# Patient Record
Sex: Male | Born: 1980 | Race: Black or African American | Hispanic: No | Marital: Single | Smoking: Former smoker
Health system: Southern US, Community
[De-identification: ages and names within clinical notes are randomized; demographics above are authoritative.]

## PROBLEM LIST (undated history)

## (undated) DIAGNOSIS — H9319 Tinnitus, unspecified ear: Secondary | ICD-10-CM

## (undated) DIAGNOSIS — E785 Hyperlipidemia, unspecified: Secondary | ICD-10-CM

## (undated) DIAGNOSIS — K700 Alcoholic fatty liver: Secondary | ICD-10-CM

## (undated) DIAGNOSIS — R42 Dizziness and giddiness: Secondary | ICD-10-CM

## (undated) DIAGNOSIS — G43909 Migraine, unspecified, not intractable, without status migrainosus: Secondary | ICD-10-CM

## (undated) DIAGNOSIS — I10 Essential (primary) hypertension: Secondary | ICD-10-CM

## (undated) DIAGNOSIS — K219 Gastro-esophageal reflux disease without esophagitis: Secondary | ICD-10-CM

## (undated) DIAGNOSIS — J342 Deviated nasal septum: Secondary | ICD-10-CM

## (undated) DIAGNOSIS — G709 Myoneural disorder, unspecified: Secondary | ICD-10-CM

## (undated) DIAGNOSIS — J343 Hypertrophy of nasal turbinates: Secondary | ICD-10-CM

## (undated) HISTORY — PX: NO PAST SURGERIES: SHX2092

## (undated) HISTORY — PX: COLONOSCOPY: SHX174

---
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
## 2002-10-14 ENCOUNTER — Emergency Department (HOSPITAL_COMMUNITY): Admission: EM | Admit: 2002-10-14 | Discharge: 2002-10-14 | Payer: Self-pay | Admitting: Emergency Medicine

## 2005-10-01 ENCOUNTER — Emergency Department (HOSPITAL_COMMUNITY): Admission: EM | Admit: 2005-10-01 | Discharge: 2005-10-01 | Payer: Self-pay | Admitting: Emergency Medicine

## 2005-11-16 ENCOUNTER — Emergency Department (HOSPITAL_COMMUNITY): Admission: EM | Admit: 2005-11-16 | Discharge: 2005-11-16 | Payer: Self-pay | Admitting: Emergency Medicine

## 2006-07-30 ENCOUNTER — Emergency Department (HOSPITAL_COMMUNITY): Admission: EM | Admit: 2006-07-30 | Discharge: 2006-07-30 | Payer: Self-pay | Admitting: Emergency Medicine

## 2007-06-26 ENCOUNTER — Emergency Department (HOSPITAL_COMMUNITY): Admission: EM | Admit: 2007-06-26 | Discharge: 2007-06-26 | Payer: Self-pay | Admitting: Emergency Medicine

## 2007-09-10 ENCOUNTER — Emergency Department (HOSPITAL_COMMUNITY): Admission: EM | Admit: 2007-09-10 | Discharge: 2007-09-10 | Payer: Self-pay | Admitting: Emergency Medicine

## 2008-06-06 ENCOUNTER — Emergency Department (HOSPITAL_COMMUNITY): Admission: EM | Admit: 2008-06-06 | Discharge: 2008-06-06 | Payer: Self-pay | Admitting: Emergency Medicine

## 2008-12-10 ENCOUNTER — Emergency Department (HOSPITAL_COMMUNITY): Admission: EM | Admit: 2008-12-10 | Discharge: 2008-12-10 | Payer: Self-pay | Admitting: Emergency Medicine

## 2010-10-28 ENCOUNTER — Emergency Department (HOSPITAL_COMMUNITY)
Admission: EM | Admit: 2010-10-28 | Discharge: 2010-10-29 | Payer: Self-pay | Source: Home / Self Care | Admitting: Emergency Medicine

## 2011-06-05 ENCOUNTER — Emergency Department (HOSPITAL_COMMUNITY)
Admission: EM | Admit: 2011-06-05 | Discharge: 2011-06-05 | Disposition: A | Discharge status: Home / Self Care | Payer: Self-pay | Attending: Emergency Medicine | Admitting: Emergency Medicine

## 2011-06-05 ENCOUNTER — Emergency Department (HOSPITAL_COMMUNITY): Payer: Self-pay

## 2011-06-05 DIAGNOSIS — R079 Chest pain, unspecified: Secondary | ICD-10-CM | POA: Insufficient documentation

## 2011-06-05 DIAGNOSIS — R209 Unspecified disturbances of skin sensation: Secondary | ICD-10-CM | POA: Insufficient documentation

## 2011-06-05 DIAGNOSIS — F17200 Nicotine dependence, unspecified, uncomplicated: Secondary | ICD-10-CM | POA: Insufficient documentation

## 2011-06-05 DIAGNOSIS — R51 Headache: Secondary | ICD-10-CM | POA: Insufficient documentation

## 2011-06-05 LAB — COMPREHENSIVE METABOLIC PANEL
ALT: 56 U/L — ABNORMAL HIGH (ref 0–53)
AST: 35 U/L (ref 0–37)
Albumin: 4.1 g/dL (ref 3.5–5.2)
Alkaline Phosphatase: 90 U/L (ref 39–117)
BUN: 17 mg/dL (ref 6–23)
CO2: 28 mEq/L (ref 19–32)
Calcium: 9.8 mg/dL (ref 8.4–10.5)
Chloride: 103 mEq/L (ref 96–112)
Creatinine, Ser: 0.93 mg/dL (ref 0.50–1.35)
GFR calc Af Amer: >60 mL/min (ref >60)
GFR calc non Af Amer: >60 mL/min (ref >60)
Glucose, Bld: 110 mg/dL — ABNORMAL HIGH (ref 70–99)
Potassium: 4.1 mEq/L (ref 3.5–5.1)
Sodium: 140 mEq/L (ref 135–145)
Total Bilirubin: 0.3 mg/dL (ref 0.3–1.2)
Total Protein: 7.9 g/dL (ref 6.0–8.3)

## 2011-06-05 LAB — TROPONIN I: Troponin I: <0.3 ng/mL (ref <0.30)

## 2013-04-06 ENCOUNTER — Emergency Department (HOSPITAL_COMMUNITY)
Admission: EM | Admit: 2013-04-06 | Discharge: 2013-04-06 | Disposition: A | Discharge status: Home / Self Care | Payer: Self-pay | Source: Home / Self Care

## 2013-04-06 ENCOUNTER — Encounter (HOSPITAL_COMMUNITY): Payer: Self-pay | Admitting: Emergency Medicine

## 2013-04-06 DIAGNOSIS — M545 Low back pain: Secondary | ICD-10-CM

## 2013-04-06 DIAGNOSIS — M7918 Myalgia, other site: Secondary | ICD-10-CM

## 2013-04-06 LAB — POCT URINALYSIS DIP (DEVICE)
Hgb urine dipstick: NEGATIVE
Ketones, ur: NEGATIVE mg/dL
Protein, ur: NEGATIVE mg/dL
Specific Gravity, Urine: 1.02 (ref 1.005–1.030)

## 2013-04-06 MED ORDER — TRAMADOL HCL 50 MG PO TABS: 50.0000 mg | ORAL_TABLET | Freq: Four times a day (QID) | ORAL | Status: DC | PRN

## 2013-04-06 NOTE — ED Provider Notes (Signed)
History     CSN: [id number]  Arrival date & time 04/06/13  1626   None     Chief Complaint  Patient presents with  . 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 He complains of this dull ache in the lower back primarily in the right low back. No history of injury, fall. Worse with prolonged standing and bending frequently.  Patient is a 32 y.o. male presenting with back pain.  Back Pain Location:  Lumbar spine Quality:  Aching Radiates to:  Does not radiate Pain severity:  Moderate Pain is:  Worse during the day Onset quality:  Gradual Duration:  2 weeks Timing:  Intermittent Progression:  Worsening Chronicity:  Recurrent Relieved by:  Bed rest and lying down Worsened by:  Standing and palpation Ineffective treatments:  Ibuprofen Associated symptoms: no headaches, no numbness and no weakness     History reviewed. No pertinent past medical history.  History reviewed. No pertinent past surgical history.  No family history on file.  History  Substance Use Topics  . Smoking status: Current Every Day Smoker  . Smokeless tobacco: Not on file  . Alcohol Use: Yes      Review of Systems  Constitutional: Negative.   Respiratory: Negative.   Gastrointestinal: Negative.   Genitourinary: Negative.   Musculoskeletal: Positive for back pain.       As per HPI  Skin: Negative.   Neurological: Negative for dizziness, weakness, numbness and headaches.    Allergies  Review of patient's allergies indicates no known allergies.  Home Medications   Current Outpatient Rx  Name  Route  Sig  Dispense  Refill  . traMADol (ULTRAM) 50 MG tablet   Oral   Take 1 tablet (50 mg total) by mouth every 6 (six) hours as needed for pain.   15 tablet   0     BP 148/94  Pulse 71  Temp(Src) 97.8 F (36.6 C)  (Oral)  Resp 18  SpO2 98%  Physical Exam  Nursing note and vitals reviewed. Constitutional: He is oriented to person, place, and time. He appears well-developed and well-nourished.  HENT:  Head: Normocephalic and atraumatic.  Eyes: EOM are normal. Left eye exhibits no discharge.  Neck: Normal range of motion. Neck supple.  Musculoskeletal:  Tenderness and a small area of the right low back there is a mobile tender knot-like structure most likely representing a muscle spasm. No spinal tenderness or pain. No radiation of pain or radicular pain.  Neurological: He is alert and oriented to person, place, and time. No cranial nerve deficit.  Skin: Skin is warm and dry.  Psychiatric: He has a normal mood and affect.    ED Course  Procedures (including critical care time)  Labs Reviewed  POCT URINALYSIS DIP (DEVICE)   No results found.   1. Lumbar muscle pain       MDM  Tramadol 50 mg by mouth Q6 hours when necessary back pain Apply heat frequently over the area of tenderness and discomfort Perform frequent stretches as demonstrated Information on stretching and back exercises. Refrained from bending or leaning forward pulling or heavy lifting.        [name], NP 04/06/13 [phone]

## 2013-04-06 NOTE — ED Notes (Signed)
Pt c/o right lower back pain onset 2 weeks Reports numbness when he stands and also has started to exercise x2 months... Also constantly on his feet at work Denies inj/trauma, dysuria... Takes tyle and ibup w/no relief  He is alert and oriented w/no signs of acute distress.

## 2013-04-06 NOTE — ED Provider Notes (Signed)
Medical screening examination/treatment/procedure(s) were performed by non-physician practitioner and as supervising physician I was immediately available for consultation/collaboration.  Ailea Rhatigan, M.D.  Sandrine Bloodsworth C Mose Colaizzi, MD 04/06/13 2032 

## 2014-01-08 ENCOUNTER — Emergency Department (INDEPENDENT_AMBULATORY_CARE_PROVIDER_SITE_OTHER): Payer: Self-pay

## 2014-01-08 ENCOUNTER — Encounter (HOSPITAL_COMMUNITY): Payer: Self-pay | Admitting: Emergency Medicine

## 2014-01-08 ENCOUNTER — Emergency Department (INDEPENDENT_AMBULATORY_CARE_PROVIDER_SITE_OTHER)
Admission: EM | Admit: 2014-01-08 | Discharge: 2014-01-08 | Disposition: A | Discharge status: Home / Self Care | Payer: Self-pay | Source: Home / Self Care | Attending: Family Medicine | Admitting: Family Medicine

## 2014-01-08 DIAGNOSIS — I10 Essential (primary) hypertension: Secondary | ICD-10-CM

## 2014-01-08 DIAGNOSIS — K2970 Gastritis, unspecified, without bleeding: Secondary | ICD-10-CM

## 2014-01-08 DIAGNOSIS — K2990 Gastroduodenitis, unspecified, without bleeding: Secondary | ICD-10-CM

## 2014-01-08 LAB — POCT I-STAT, CHEM 8
BUN: 16 mg/dL (ref 6–23)
CALCIUM ION: 1.24 mmol/L — AB (ref 1.12–1.23)
CHLORIDE: 103 meq/L (ref 96–112)
Creatinine, Ser: 1.2 mg/dL (ref 0.50–1.35)
GLUCOSE: 128 mg/dL — AB (ref 70–99)
HEMATOCRIT: 44 % (ref 39.0–52.0)
HEMOGLOBIN: 15 g/dL (ref 13.0–17.0)
Potassium: 3.9 mEq/L (ref 3.7–5.3)
Sodium: 140 mEq/L (ref 137–147)
TCO2: 26 mmol/L (ref 0–100)

## 2014-01-08 MED ORDER — OMEPRAZOLE 40 MG PO CPDR: 40.0000 mg | DELAYED_RELEASE_CAPSULE | Freq: Every day | ORAL | Status: DC

## 2014-01-08 MED ORDER — GI COCKTAIL ~~LOC~~
ORAL | Status: AC
  Filled 2014-01-08: qty 30

## 2014-01-08 MED ORDER — HYDROCHLOROTHIAZIDE 25 MG PO TABS: 25.0000 mg | ORAL_TABLET | Freq: Every day | ORAL | Status: DC

## 2014-01-08 MED ORDER — GI COCKTAIL ~~LOC~~
30.0000 mL | Freq: Once | ORAL | Status: AC
  Administered 2014-01-08: 30 mL via ORAL

## 2014-01-08 NOTE — Discharge Instructions (Signed)
Thank you for coming in today. Take omeprazole daily for heartburn Take hydrochlorothiazide daily for blood pressure Call or go to the emergency room if you get worse, have trouble breathing, have chest pains, or palpitations.  Please see Rudell Cobb to qualify for reduced or free medical services within the [facility].  Call her at [phone] today. [facility] [place] [facility] [address] [place], [place] [address] [phone]  Arterial Hypertension Arterial hypertension (high blood pressure) is a condition of elevated pressure in your blood vessels. Hypertension over a long period of time is a risk factor for strokes, heart attacks, and heart failure. It is also the leading cause of kidney (renal) failure.  CAUSES   In Adults -- Over 90% of all hypertension has no known cause. This is called essential or primary hypertension. In the other 10% of people with hypertension, the increase in blood pressure is caused by another disorder. This is called secondary hypertension. Important causes of secondary hypertension are:  Heavy alcohol use.  Obstructive sleep apnea.  Hyperaldosterosim (Conn's syndrome).  Steroid use.  Chronic kidney failure.  Hyperparathyroidism.  Medications.  Renal artery stenosis.  Pheochromocytoma.  Cushing's disease.  Coarctation of the aorta.  Scleroderma renal crisis.  Licorice (in excessive amounts).  Drugs (cocaine, methamphetamine). Your caregiver can explain any items above that apply to you.  In Children -- Secondary hypertension is more common and should always be considered.  Pregnancy -- Few women of childbearing age have high blood pressure. However, up to 10% of them develop hypertension of pregnancy. Generally, this will not harm the woman. It may be a sign of 3 complications of pregnancy: preeclampsia, HELLP syndrome, and eclampsia. Follow up and control with medication is necessary. SYMPTOMS   This  condition normally does not produce any noticeable symptoms. It is usually found during a routine exam.  Malignant hypertension is a late problem of high blood pressure. It may have the following symptoms:  Headaches.  Blurred vision.  End-organ damage (this means your kidneys, heart, lungs, and other organs are being damaged).  Stressful situations can increase the blood pressure. If a person with normal blood pressure has their blood pressure go up while being seen by their caregiver, this is often termed "white coat hypertension." Its importance is not known. It may be related with eventually developing hypertension or complications of hypertension.  Hypertension is often confused with mental tension, stress, and anxiety. DIAGNOSIS  The diagnosis is made by 3 separate blood pressure measurements. They are taken at least 1 week apart from each other. If there is organ damage from hypertension, the diagnosis may be made without repeat measurements. Hypertension is usually identified by having blood pressure readings:  Above 140/90 mmHg measured in both arms, at 3 separate times, over a couple weeks.  Over 130/80 mmHg should be considered a risk factor and may require treatment in patients with diabetes. Blood pressure readings over 120/80 mmHg are called "pre-hypertension" even in non-diabetic patients. To get a true blood pressure measurement, use the following guidelines. Be aware of the factors that can alter blood pressure readings.  Take measurements at least 1 hour after caffeine.  Take measurements 30 minutes after smoking and without any stress. This is another reason to quit smoking  it raises your blood pressure.  Use a proper cuff size. Ask your caregiver if you are not sure about your cuff size.  Most home blood pressure cuffs are automatic. They will measure systolic and  diastolic pressures. The systolic pressure is the pressure reading at the start of sounds. Diastolic  pressure is the pressure at which the sounds disappear. If you are elderly, measure pressures in multiple postures. Try sitting, lying or standing.  Sit at rest for a minimum of 5 minutes before taking measurements.  You should not be on any medications like decongestants. These are found in many cold medications.  Record your blood pressure readings and review them with your caregiver. If you have hypertension:  Your caregiver may do tests to be sure you do not have secondary hypertension (see "causes" above).  Your caregiver may also look for signs of metabolic syndrome. This is also called Syndrome X or Insulin Resistance Syndrome. You may have this syndrome if you have type 2 diabetes, abdominal obesity, and abnormal blood lipids in addition to hypertension.  Your caregiver will take your medical and family history and perform a physical exam.  Diagnostic tests may include blood tests (for glucose, cholesterol, potassium, and kidney function), a urinalysis, or an EKG. Other tests may also be necessary depending on your condition. PREVENTION  There are important lifestyle issues that you can adopt to reduce your chance of developing hypertension:  Maintain a normal weight.  Limit the amount of salt (sodium) in your diet.  Exercise often.  Limit alcohol intake.  Get enough potassium in your diet. Discuss specific advice with your caregiver.  Follow a DASH diet (dietary approaches to stop hypertension). This diet is rich in fruits, vegetables, and low-fat dairy products, and avoids certain fats. PROGNOSIS  Essential hypertension cannot be cured. Lifestyle changes and medical treatment can lower blood pressure and reduce complications. The prognosis of secondary hypertension depends on the underlying cause. Many people whose hypertension is controlled with medicine or lifestyle changes can live a normal, healthy life.  RISKS AND COMPLICATIONS  While high blood pressure alone is not  an illness, it often requires treatment due to its short- and long-term effects on many organs. Hypertension increases your risk for:  CVAs or strokes (cerebrovascular accident).  Heart failure due to chronically high blood pressure (hypertensive cardiomyopathy).  Heart attack (myocardial infarction).  Damage to the retina (hypertensive retinopathy).  Kidney failure (hypertensive nephropathy). Your caregiver can explain list items above that apply to you. Treatment of hypertension can significantly reduce the risk of complications. TREATMENT   For overweight patients, weight loss and regular exercise are recommended. Physical fitness lowers blood pressure.  Mild hypertension is usually treated with diet and exercise. A diet rich in fruits and vegetables, fat-free dairy products, and foods low in fat and salt (sodium) can help lower blood pressure. Decreasing salt intake decreases blood pressure in a 1/3 of people.  Stop smoking if you are a smoker. The steps above are highly effective in reducing blood pressure. While these actions are easy to suggest, they are difficult to achieve. Most patients with moderate or severe hypertension end up requiring medications to bring their blood pressure down to a normal level. There are several classes of medications for treatment. Blood pressure pills (antihypertensives) will lower blood pressure by their different actions. Lowering the blood pressure by 10 mmHg may decrease the risk of complications by as much as 25%. The goal of treatment is effective blood pressure control. This will reduce your risk for complications. Your caregiver will help you determine the best treatment for you according to your lifestyle. What is excellent treatment for one person, may not be for you. HOME CARE INSTRUCTIONS  Do not smoke.  Follow the lifestyle changes outlined in the "Prevention" section.  If you are on medications, follow the directions carefully. Blood  pressure medications must be taken as prescribed. Skipping doses reduces their benefit. It also puts you at risk for problems.  Follow up with your caregiver, as directed.  If you are asked to monitor your blood pressure at home, follow the guidelines in the "Diagnosis" section above. SEEK MEDICAL CARE IF:   You think you are having medication side effects.  You have recurrent headaches or lightheadedness.  You have swelling in your ankles.  You have trouble with your vision. SEEK IMMEDIATE MEDICAL CARE IF:   You have sudden onset of chest pain or pressure, difficulty breathing, or other symptoms of a heart attack.  You have a severe headache.  You have symptoms of a stroke (such as sudden weakness, difficulty speaking, difficulty walking). MAKE SURE YOU:   Understand these instructions.  Will watch your condition.  Will get help right away if you are not doing well or get worse. Document Released: 11/01/2005 Document Revised: 01/24/2012 Document Reviewed: 06/01/2007 [facility] Patient Information 2014 [place], [place].  Gastritis, Adult Gastritis is soreness and swelling (inflammation) of the lining of the stomach. Gastritis can develop as a sudden onset (acute) or long-term (chronic) condition. If gastritis is not treated, it can lead to stomach bleeding and ulcers. CAUSES  Gastritis occurs when the stomach lining is weak or damaged. Digestive juices from the stomach then inflame the weakened stomach lining. The stomach lining may be weak or damaged due to viral or bacterial infections. One common bacterial infection is the Helicobacter pylori infection. Gastritis can also result from excessive alcohol consumption, taking certain medicines, or having too much acid in the stomach.  SYMPTOMS  In some cases, there are no symptoms. When symptoms are present, they may include:  Pain or a burning sensation in the upper abdomen.  Nausea.  Vomiting.  An uncomfortable feeling of  fullness after eating. DIAGNOSIS  Your caregiver may suspect you have gastritis based on your symptoms and a physical exam. To determine the cause of your gastritis, your caregiver may perform the following:  Blood or stool tests to check for the H pylori bacterium.  Gastroscopy. A thin, flexible tube (endoscope) is passed down the esophagus and into the stomach. The endoscope has a light and camera on the end. Your caregiver uses the endoscope to view the inside of the stomach.  Taking a tissue sample (biopsy) from the stomach to examine under a microscope. TREATMENT  Depending on the cause of your gastritis, medicines may be prescribed. If you have a bacterial infection, such as an H pylori infection, antibiotics may be given. If your gastritis is caused by too much acid in the stomach, H2 blockers or antacids may be given. Your caregiver may recommend that you stop taking aspirin, ibuprofen, or other nonsteroidal anti-inflammatory drugs (NSAIDs). HOME CARE INSTRUCTIONS  Only take over-the-counter or prescription medicines as directed by your caregiver.  If you were given antibiotic medicines, take them as directed. Finish them even if you start to feel better.  Drink enough fluids to keep your urine clear or pale yellow.  Avoid foods and drinks that make your symptoms worse, such as:  Caffeine or alcoholic drinks.  Chocolate.  Peppermint or mint flavorings.  Garlic and onions.  Spicy foods.  Citrus fruits, such as oranges, lemons, or limes.  Tomato-based foods such as sauce, chili, salsa, and pizza.  [place] and  fatty foods.  Eat small, frequent meals instead of large meals. SEEK IMMEDIATE MEDICAL CARE IF:   You have black or dark red stools.  You vomit blood or material that looks like coffee grounds.  You are unable to keep fluids down.  Your abdominal pain gets worse.  You have a fever.  You do not feel better after 1 week.  You have any other questions or  concerns. MAKE SURE YOU:  Understand these instructions.  Will watch your condition.  Will get help right away if you are not doing well or get worse. Document Released: 10/26/2001 Document Revised: 05/02/2012 Document Reviewed: 12/15/2011 [facility] Patient Information 2014 [place], [place].

## 2014-01-08 NOTE — ED Provider Notes (Signed)
Eloise Levelslex T Bernstein is a 33 y.o. male who presents to Urgent Care today for chest pain. Patient has had 2 months of central nonexertional chest pain. This is associated with reflux and heartburn. He notes that Prilosec over the past few days has helped. He denies any exertional component. He denies a significant shortness of breath nausea vomiting or diarrhea or palpitations. He feels well otherwise. He does not have health insurance and has not seen a primary care provider in years.   History reviewed. No pertinent past medical history. History  Substance Use Topics  . Smoking status: Current Every Day Smoker  . Smokeless tobacco: Not on file  . Alcohol Use: Yes   ROS as above Medications: No current facility-administered medications for this encounter.   Current Outpatient Prescriptions  Medication Sig Dispense Refill  . hydrochlorothiazide (HYDRODIURIL) 25 MG tablet Take 1 tablet (25 mg total) by mouth daily.  30 tablet  1  . omeprazole (PRILOSEC) 40 MG capsule Take 1 capsule (40 mg total) by mouth daily.  30 capsule  1    Exam:  BP 163/107  Pulse 93  Temp(Src) 98.9 F (37.2 C) (Oral)  Resp 16  SpO2 97% Gen: Well NAD HEENT: EOMI,  MMM Lungs: Normal work of breathing. CTABL Heart: RRR no MRG Abd: NABS, Soft. NT, ND Exts: Brisk capillary refill, warm and well perfused.   Patient was given a GI cocktail and had considerable improvement in symptoms  Twelve-lead EKG shows normal sinus rhythm at 88 beats per minute. Flattened lateral precordial T waves. No ST segment elevation or depression. Unchanged from previous EKG  Results for orders placed during the hospital encounter of 01/08/14 (from the past 24 hour(s))  POCT I-STAT, CHEM 8     Status: Abnormal   Collection Time    01/08/14 12:20 PM      Result Value Ref Range   Sodium 140  137 - 147 mEq/L   Potassium 3.9  3.7 - 5.3 mEq/L   Chloride 103  96 - 112 mEq/L   BUN 16  6 - 23 mg/dL   Creatinine, Ser 7.251.20  0.50 - 1.35 mg/dL   Glucose, Bld 366128 (*) 70 - 99 mg/dL   Calcium, Ion 4.401.24 (*) 1.12 - 1.23 mmol/L   TCO2 26  0 - 100 mmol/L   Hemoglobin 15.0  13.0 - 17.0 g/dL   HCT 34.744.0  42.539.0 - 95.652.0 %   Dg Chest 2 View  01/08/2014   CLINICAL DATA:  Chest pain and pressure, history of tobacco use  EXAM: CHEST  2 VIEW  COMPARISON:  None.  FINDINGS: The lungs are adequately inflated and clear. The cardiopericardial silhouette is normal in size. The mediastinum is normal in width. There is no pleural effusion or pneumothorax or pneumomediastinum. The observed portions of the bony thorax are normal.  IMPRESSION: There is no evidence of active cardiopulmonary disease.   Electronically Signed   By: David  SwazilandJordan   On: 01/08/2014 12:20    Assessment and Plan: 33 y.o. male with pain is most likely related to gastritis or esophagitis. Patient had significant improvement with GI cocktail and has a unchanged EKG and normal chest x-ray. Plan to treat with omeprazole. Additionally we'll treat underlying hypertension with hydrochlorothiazide. Recommend followup with primary care provider. Patient has mild elevation of creatinine. This will ultimately require workup.  Discussed warning signs or symptoms. Please see discharge instructions. Patient expresses understanding.    Rodolph BongEvan S Ethelmae Ringel, MD 01/08/14 [phone]

## 2014-01-08 NOTE — ED Notes (Signed)
C/o chest pain for two months Has tried Prilosec but no relief.   Admits to having high blood pressure Has not seen a pcp in years

## 2014-01-12 ENCOUNTER — Emergency Department (HOSPITAL_COMMUNITY)
Admission: EM | Admit: 2014-01-12 | Discharge: 2014-01-12 | Disposition: A | Discharge status: Home / Self Care | Payer: Self-pay | Attending: Emergency Medicine | Admitting: Emergency Medicine

## 2014-01-12 ENCOUNTER — Encounter (HOSPITAL_COMMUNITY): Payer: Self-pay | Admitting: Emergency Medicine

## 2014-01-12 DIAGNOSIS — R002 Palpitations: Secondary | ICD-10-CM | POA: Insufficient documentation

## 2014-01-12 DIAGNOSIS — F17200 Nicotine dependence, unspecified, uncomplicated: Secondary | ICD-10-CM | POA: Insufficient documentation

## 2014-01-12 DIAGNOSIS — I10 Essential (primary) hypertension: Secondary | ICD-10-CM | POA: Insufficient documentation

## 2014-01-12 DIAGNOSIS — F410 Panic disorder [episodic paroxysmal anxiety] without agoraphobia: Secondary | ICD-10-CM | POA: Insufficient documentation

## 2014-01-12 DIAGNOSIS — R0602 Shortness of breath: Secondary | ICD-10-CM | POA: Insufficient documentation

## 2014-01-12 DIAGNOSIS — K219 Gastro-esophageal reflux disease without esophagitis: Secondary | ICD-10-CM | POA: Insufficient documentation

## 2014-01-12 DIAGNOSIS — R000 Tachycardia, unspecified: Secondary | ICD-10-CM

## 2014-01-12 DIAGNOSIS — Z79899 Other long term (current) drug therapy: Secondary | ICD-10-CM | POA: Insufficient documentation

## 2014-01-12 MED ORDER — LORAZEPAM 1 MG PO TABS: 1.0000 mg | ORAL_TABLET | Freq: Four times a day (QID) | ORAL | Status: DC | PRN

## 2014-01-12 MED ORDER — LORAZEPAM 1 MG PO TABS
1.0000 mg | ORAL_TABLET | Freq: Once | ORAL | Status: AC
  Administered 2014-01-12: 1 mg via ORAL
  Filled 2014-01-12: qty 1

## 2014-01-12 NOTE — ED Provider Notes (Signed)
CSN: [id number]     Arrival date & time 01/12/14  1756 History  This chart was scribed for non-physician practitioner, Arthor Captain, PA-C,working with Juliet Rude. Rubin Payor, MD, by Karle Plumber, ED Scribe.  This patient was seen in room WTR7/WTR7 and the patient's care was started at 7:36 PM.  Chief Complaint  Patient presents with  . Panic Attack   The history is provided by the patient. No language interpreter was used.   HPI Comments:  JAMY CLECKLER is a 33 y.o. male who presents to the Emergency Department complaining of a panic attack. He reports associated heart racing, SOB, and feeling as if a lump is in his throat. He reports one episode of emesis approximately two hours ago after eating. Pt states he presented to urgent care four days ago and was given medication for HCTZ and omeprazole for HTN and GERD. He states that he is concerned that his medication has caused the symptoms. He reports that he used to have panic attacks approximately 15 years ago but cannot remember if this feels the same. He denies rapid breathing, diaphoresis, or hot/cold intolerance. He denies family h/o sudden cardiac death. Pt states he is a smoker.  History reviewed. No pertinent past medical history. History reviewed. No pertinent past surgical history. No family history on file. History  Substance Use Topics  . Smoking status: Current Every Day Smoker  . Smokeless tobacco: Not on file  . Alcohol Use: Yes    Review of Systems  Constitutional: Negative for diaphoresis.  Respiratory: Positive for shortness of breath.   Gastrointestinal: Positive for vomiting.  Endocrine: Negative for cold intolerance and heat intolerance.  All other systems reviewed and are negative.    Allergies  Review of patient's allergies indicates no known allergies.  Home Medications   Current Outpatient Rx  Name  Route  Sig  Dispense  Refill  . hydrochlorothiazide (HYDRODIURIL) 25 MG tablet   Oral   Take 1 tablet  (25 mg total) by mouth daily.   30 tablet   1   . omeprazole (PRILOSEC) 40 MG capsule   Oral   Take 1 capsule (40 mg total) by mouth daily.   30 capsule   1    Triage Vitals: BP 130/82  Pulse 102  Temp(Src) 98.8 F (37.1 C) (Oral)  Resp 20  SpO2 99% Physical Exam  Nursing note and vitals reviewed. Constitutional: He is oriented to person, place, and time. He appears well-developed and well-nourished.  HENT:  Head: Normocephalic and atraumatic.  Eyes: EOM are normal.  Neck: Normal range of motion.  Cardiovascular: Normal rate.   Pulmonary/Chest: Effort normal.  Musculoskeletal: Normal range of motion.  Neurological: He is alert and oriented to person, place, and time.  Skin: Skin is warm and dry.  Psychiatric: He has a normal mood and affect. His behavior is normal.    ED Course  Procedures (including critical care time) DIAGNOSTIC STUDIES: Oxygen Saturation is 99% on RA, normal by my interpretation.   COORDINATION OF CARE: 7:42 PM- Will speak with Dr. Rubin Payor about appropriate course of treatment. Pt verbalizes understanding and agrees to plan.  Medications - No data to display  Labs Review Labs Reviewed - No data to display Imaging Review No results found.   EKG Interpretation   Date/Time:  Saturday January 12 2014 18:12:15 EST Ventricular Rate:  94 PR Interval:  160 QRS Duration: 90 QT Interval:  431 QTC Calculation: 539 R Axis:   40 Text Interpretation:  Sinus rhythm Borderline repolarization abnormality  Prolonged QT interval Confirmed by Rubin PayorPICKERING  MD, Harrold DonathNATHAN [phone]([id number]) on  01/12/2014 7:45:52 PM      MDM   Final diagnoses:  Racing heart beat  SOB (shortness of breath)  Panic    Patient with possible panic attack vs. Arrhythmia. He does have a prolonged QT interval on EKG. I have discussed that patient will need cardiac follow up for holter monitoring. He has no family hx of sudden cardiac death. Patient will also be given ativan to take when  sxs occur.  I personally performed the services described in this documentation, which was scribed in my presence. The recorded information has been reviewed and is accurate.    Arthor CaptainAbigail Asmi Fugere, PA-C 01/17/14 1154

## 2014-01-12 NOTE — ED Notes (Signed)
Pt from home, reports that he was seen at UC 4 days ago and was dx with HTN. Pt reports that he was rx'd HCTZ and prilosec. Since pt states that he is having anxiety attacks. Pt wants to be evaluated to make sure that he is not having reaction to meds. Pt denies other s/sx Pt is A&O and in NAD

## 2014-01-12 NOTE — Discharge Instructions (Signed)
Panic Attacks °Panic attacks are sudden, short-lived surges of severe anxiety, fear, or discomfort. They may occur for no reason when you are relaxed, when you are anxious, or when you are sleeping. Panic attacks may occur for a number of reasons:  °· Healthy people occasionally have panic attacks in extreme, life-threatening situations, such as war or natural disasters. Normal anxiety is a protective mechanism of the body that helps us react to danger (fight or flight response). °· Panic attacks are often seen with anxiety disorders, such as panic disorder, social anxiety disorder, generalized anxiety disorder, and phobias. Anxiety disorders cause excessive or uncontrollable anxiety. They may interfere with your relationships or other life activities. °· Panic attacks are sometimes seen with other mental illnesses such as depression and posttraumatic stress disorder. °· Certain medical conditions, prescription medicines, and drugs of abuse can cause panic attacks. °SYMPTOMS  °Panic attacks start suddenly, peak within 20 minutes, and are accompanied by four or more of the following symptoms: °· Pounding heart or fast heart rate (palpitations). °· Sweating. °· Trembling or shaking. °· Shortness of breath or feeling smothered. °· Feeling choked. °· Chest pain or discomfort. °· Nausea or strange feeling in your stomach. °· Dizziness, lightheadedness, or feeling like you will faint. °· Chills or hot flushes. °· Numbness or tingling in your lips or hands and feet. °· Feeling that things are not real or feeling that you are not yourself. °· Fear of losing control or going crazy. °· Fear of dying. °Some of these symptoms can mimic serious medical conditions. For example, you may think you are having a heart attack. Although panic attacks can be very scary, they are not life threatening. °DIAGNOSIS  °Panic attacks are diagnosed through an assessment by your health care provider. Your health care provider will ask questions  about your symptoms, such as where and when they occurred. Your health care provider will also ask about your medical history and use of alcohol and drugs, including prescription medicines. Your health care provider may order blood tests or other studies to rule out a serious medical condition. Your health care provider may refer you to a mental health professional for further evaluation. °TREATMENT  °· Most healthy people who have one or two panic attacks in an extreme, life-threatening situation will not require treatment. °· The treatment for panic attacks associated with anxiety disorders or other mental illness typically involves counseling with a mental health professional, medicine, or a combination of both. Your health care provider will help determine what treatment is best for you. °· Panic attacks due to physical illness usually goes away with treatment of the illness. If prescription medicine is causing panic attacks, talk with your health care provider about stopping the medicine, decreasing the dose, or substituting another medicine. °· Panic attacks due to alcohol or drug abuse goes away with abstinence. Some adults need professional help in order to stop drinking or using drugs. °HOME CARE INSTRUCTIONS  °· Take all your medicines as prescribed.   °· Check with your health care provider before starting new prescription or over-the-counter medicines. °· Keep all follow up appointments with your health care provider. °SEEK MEDICAL CARE IF: °· You are not able to take your medicines as prescribed. °· Your symptoms do not improve or get worse. °SEEK IMMEDIATE MEDICAL CARE IF:  °· You experience panic attack symptoms that are different than your usual symptoms. °· You have serious thoughts about hurting yourself or others. °· You are taking medicine for panic attacks and   have a serious side effect. MAKE SURE YOU:  Understand these instructions.  Will watch your condition.  Will get help right away  if you are not doing well or get worse. Document Released: 11/01/2005 Document Revised: 08/22/2013 Document Reviewed: 06/15/2013 [facility] Patient Information 2014 [place], [place].  Shortness of Breath Shortness of breath means you have trouble breathing. Shortness of breath needs medical care right away. HOME CARE   Do not smoke.  Avoid being around chemicals or things (paint fumes, dust) that may bother your breathing.  Rest as needed. Slowly begin your normal activities.  Only take medicines as told by your doctor.  Keep all doctor visits as told. GET HELP RIGHT AWAY IF:   Your shortness of breath gets worse.  You feel lightheaded, pass out (faint), or have a cough that is not helped by medicine.  You cough up blood.  You have pain with breathing.  You have pain in your chest, arms, shoulders, or belly (abdomen).  You have a fever.  You cannot walk up stairs or exercise the way you normally do.  You do not get better in the time expected.  You have a hard time doing normal activities even with rest.  You have problems with your medicines.  You have any new symptoms. MAKE SURE YOU:  Understand these instructions.  Will watch your condition.  Will get help right away if you are not doing well or get worse. Document Released: 04/19/2008 Document Revised: 05/02/2012 Document Reviewed: 01/17/2012 [facility] Patient Information 2014 [place], [place].  Pursed Lip Breathing Pursed lip breathing keeps airways open longer during breathing out (exhaling). This helps release trapped air from your lungs and allows fresh air to come in. Practice pursed lip breathing so you can use this method when you are feeling short of breath. HOW TO PERFORM PURSED LIP BREATHING 1. Close your mouth. 2. Breathe in (inhale) through your nose, taking a normal-sized breath. The rate of breathing in may be your normal rate, or you can try breathing in with a slow count to two or three if you feel  you are not getting enough air. 3. Pucker your lips as if you were going to whistle. 4. Gently tighten your stomach muscles to help push the air out. 5. Breathe out slowly through your pursed lips. You should breathe out at least twice as long as you breathe in. 6.  Be sure you breathe out all of the air, but do not force the air out. GET HELP IF: Your shortness of breath gets worse. GET HELP RIGHT AWAY IF:  You are extremely short of breath. Document Released: 08/10/2008 Document Revised: 08/22/2013 Document Reviewed: 06/13/2013 [facility] Patient Information 2014 [place], [place].  Cardiac Arrhythmia Your heart is a muscle that works to pump blood through your body by regular contractions. The beating of your heart is controlled by a system of special pacemaker cells. These cells control the electrical activity of the heart. When the system controlling this regular beating is disturbed, a heart rhythm abnormality (arrhythmia) results. WHEN YOUR HEART SKIPS A BEAT One of the most common and least serious heart arrhythmias is called an ectopic or premature atrial heartbeat (PAC). This may be noticed as a small change in your regular pulse. A PAC originates from the top part (atrium) of the heart. Within the right atrium, the SA node is the area that normally controls the regularity of the heart. PACs occur in heart tissue outside of the SA node region. You may feel  this as a skipped beat or heart flutter, especially if several occur in succession or occur frequently.  Another arrhythmia is ventricular premature complex (VCP or PVC). These extra beats start out in the bottom, more muscular chambers of the heart. In most cases a PVC is harmless. If there are underlying causes that are making the heart irritable such as an overactive thyroid or a prior heart attack PVCs may be of more concern. In a few cases, medications to control the heart rhythm may be prescribed. Things to try at home:  Cut down or  avoid alcohol, tobacco and caffeine.  Get enough sleep.  Reduce stress.  Exercise more. WHEN THE HEART BEATS TOO FAST Atrial tachycardia is a fast heart rate, which starts out in the atrium. It may last from minutes to much longer. Your heart may beat 140 to 240 times per minute instead of the normal 60 to 100.  Symptoms include a worried feeling (anxiety) and a sense that your heart is beating fast and hard.  You may be able to stop the fast rate by holding your breath or bearing down as if you were going to have a bowel movement.  This type of fast rate is usually not dangerous. Atrial fibrillation and atrial flutter are other fast rhythms that start in the atria. Both conditions keep the atria from filling with enough blood so the heart does not work well.  Symptoms include feeling light-headed or faint.  These fast rates may be the result of heart damage or disease. Too much thyroid hormone may play a role.  There may be no clear cause or it may be from heart disease or damage.  Medication or a special electrical treatment (cardioversion) may be needed to get the heart beating normally. Ventricular tachycardia is a fast heart rate that starts in the lower muscular chambers (ventricles) This is a serious disorder that requires treatment as soon as possible. You need someone else to get and use a small defibrillator.  Symptoms include collapse, chest pain, or being short of breath.  Treatment may include medication, procedures to improve blood flow to the heart, or an implantable cardiac defibrillator (ICD). DIAGNOSIS   A cardiogram (EKG or ECG) will be done to see the arrhythmia, as well as lab tests to check the underlying cause.  If the extra beats or fast rate come and go, you may wear a Holter monitor that records your heart rate for a longer period of time. SEEK MEDICAL CARE IF:  You have irregular or fast heartbeats (palpitations).  You experience skipped beats.  You  develop lightheadedness.  You have chest discomfort.  You have shortness of breath.  You have more frequent episodes, if you are already being treated. SEEK IMMEDIATE MEDICAL CARE IF:   You have severe chest pain, especially if the pain is crushing or pressure-like and spreads to the arms, back, neck, or jaw, or if you have sweating, feeling sick to your stomach (nausea), or shortness of breath. THIS IS AN EMERGENCY. Do not wait to see if the pain will go away. Get medical help at once. Call 911 or 0 (operator). DO NOT drive yourself to the hospital.  You feel dizzy or faint.  You have episodes of previously documented atrial tachycardia that do not resolve with the techniques your caregiver has taught you.  Irregular or rapid heartbeats begin to occur more often than in the past, especially if they are associated with more pronounced symptoms or of longer duration.  Document Released: 11/01/2005 Document Revised: 01/24/2012 Document Reviewed: 06/19/2008 [facility] Patient Information 2014 [place], [place]. Lorazepam injection What is this medicine? LORAZEPAM (lor A ze pam) is a benzodiazepine. It is used to treat anxiety and certain types of seizures. It is also used to cause sleep before surgery and to block the memory of the procedure. This medicine may be used for other purposes; ask your health care provider or pharmacist if you have questions. COMMON BRAND NAME(S): Ativan What should I tell my health care provider before I take this medicine? They need to know if you have any of these conditions: -alcohol or drug abuse problem -bipolar disorder, depression, psychosis or other mental health condition -glaucoma -kidney or liver disease -lung disease or breathing difficulties -myasthenia gravis -Parkinson's disease -seizures or a history of seizures -suicidal thoughts -an unusual or allergic reaction to lorazepam, other benzodiazepines, foods, dyes, or preservatives -pregnant or  trying to get pregnant -breast-feeding How should I use this medicine? This medicine is for injection into a muscle or into a vein. It is given by a health care professional in a hospital or clinic setting. Talk to your pediatrician regarding the use of this medicine in children. Special care may be needed. Overdosage: If you think you have taken too much of this medicine contact a poison control center or emergency room at once. NOTE: This medicine is only for you. Do not share this medicine with others. What if I miss a dose? This does not apply. What may interact with this medicine? -barbiturate medicines for inducing sleep or treating seizures, like phenobarbital -clozapine -medicines for depression, mental problems or psychiatric disturbances -medicines for sleep -phenytoin -probenecid -theophylline -valproic acid This list may not describe all possible interactions. Give your health care provider a list of all the medicines, herbs, non-prescription drugs, or dietary supplements you use. Also tell them if you smoke, drink alcohol, or use illegal drugs. Some items may interact with your medicine. What should I watch for while using this medicine? You may feel dizzy or drowsy for about 6 to 8 hours after an injection of this medicine. Elderly patients may feel these effects more strongly and for a longer time. Do not drive, use machinery, or do anything that needs mental alertness until you know how this medicine affects you. To reduce the risk of dizzy and fainting spells, do not stand or sit up quickly, especially if you are an older patient. Alcohol may increase dizziness and drowsiness. Avoid alcoholic drinks. This medicine can cause loss of recall of recent events. This loss of memory is only temporary. Do not treat yourself for coughs, colds, pain or allergies without asking your doctor or health care professional for advice. Some ingredients can increase possible side effects. What  side effects may I notice from receiving this medicine? Side effects that you should report to your doctor or health care professional as soon as possible: -changes in vision -confusion -depression -mood changes, excitability or aggressive behavior -movement difficulty, staggering or jerky movements -muscle cramps -restlessness -weakness or tiredness Side effects that usually do not require medical attention (report to your doctor or health care professional if they continue or are bothersome): -constipation or diarrhea -difficulty sleeping, nightmares -dizziness, drowsiness -headache -nausea, vomiting This list may not describe all possible side effects. Call your doctor for medical advice about side effects. You may report side effects to FDA at 1-800-FDA-1088. Where should I keep my medicine? This medication will be given to you in a hospital or  health clinic setting. You will not be given this medicine to take home. NOTE: This sheet is a summary. It may not cover all possible information. If you have questions about this medicine, talk to your doctor, pharmacist, or health care provider.  2014, Elsevier/Gold Standard. (2008-05-06 17:13:24)   Emergency Department Resource Guide 1) Find a Doctor and Pay Out of Pocket Although you won't have to find out who is covered by your insurance plan, it is a good idea to ask around and get recommendations. You will then need to call the office and see if the doctor you have chosen will accept you as a new patient and what types of options they offer for patients who are self-pay. Some doctors offer discounts or will set up payment plans for their patients who do not have insurance, but you will need to ask so you aren't surprised when you get to your appointment.  2) Contact Your Local Health Department Not all health departments have doctors that can see patients for sick visits, but many do, so it is worth a call to see if yours does. If you  don't know where your local health department is, you can check in your phone book. The CDC also has a tool to help you locate your state's health department, and many state websites also have listings of all of their local health departments.  3) Find a Walk-in Clinic If your illness is not likely to be very severe or complicated, you may want to try a walk in clinic. These are popping up all over the country in pharmacies, drugstores, and shopping centers. They're usually staffed by nurse practitioners or physician assistants that have been trained to treat common illnesses and complaints. They're usually fairly quick and inexpensive. However, if you have serious medical issues or chronic medical problems, these are probably not your best option.  No Primary Care Doctor: - Call Health Connect at  [phone] - they can help you locate a primary care doctor that  accepts your insurance, provides certain services, etc. - Physician Referral Service- [phone]  Chronic Pain Problems: Organization         Address  Phone   Notes  [facility]  [phone] Patients need to be referred by their primary care doctor.   Medication Assistance: Organization         Address  Phone   Notes  [facility] Medication [facility] [address] [place] [place], [place] [address] [phone] --Must be a resident of [facility] -- Must have NO insurance coverage whatsoever (no Medicaid/ Medicare, etc.) -- The pt. MUST have a primary care doctor that directs their care regularly and follows them in the community   [facility]  [phone]   [employer]  [phone]    Agencies that provide inexpensive medical care: Organization         Address  Phone   Notes  [facility]  [phone]   [facility]    [phone]   [facility] [address] [place], [place] [address] [phone]   [facility] [address] [place], [place] [phone]   Planned Parenthood    [phone]   [facility]    [phone]   [facility] and [facility]  [address], [place] Phone:  [phone], Fax:  [phone] Hours of Operation:  9 am -  6 pm, M-F.  Also accepts Medicaid/Medicare and self-pay.  [facility]  [address], [place] Phone: [phone], Fax: [phone]. Hours of Operation:  8:30 am - 5:30 pm, M-F.  Also accepts Medicaid and self-pay.  [facility] [place] [address], [place] Phone: [phone]   [facility] [address] Natasha Bence [place], [place] [phone], Ext. [phone] Mondays & Thursdays: 7-9 AM.  First 15 patients are seen on a first come, first serve basis.    Medicaid-accepting [facility] Providers:  Organization         Address  Phone   Notes  [facility] [address], [place] [phone] Also accepts self-pay patients.  [facility] [address] Laurell Josephs [place], [place]  [phone]   [facility] [address], [place] [phone]   [facility] [address], [place] [phone]   Renaye Rakers [address], [place]   [phone] Only accepts [place] patients after they have their name applied to their card.   Self-Pay (no insurance) in [facility]:  Organization         Address  Phone   Notes  Sickle Cell Patients, [facility] [address] [place], [place] [phone]   [facility] [address] [place], [place] [phone]   [facility] [place]  [address], [place] [phone]   [facility]/Dr. Osei-Bonsu  [address] [place] or [address], [place] [phone] Phone  number for both [place] and [place] locations is the same.  [facility] and [facility] [address], [place] [phone]   [facility] [address], [place] or [address] [phone]   [facility] [address] [place] [phone], phone; [phone], fax Sees patients 1st and 3rd Saturday of every month.  Must not qualify for public or private insurance (i.e. Medicaid, Medicare, [place] Health Choice, Veterans' Benefits)  Household income should be no more than 200% of the poverty level The clinic cannot treat you if you are pregnant or think you are pregnant  Sexually transmitted diseases are not treated at the clinic.    Dental Care: Organization         Address  Phone  Notes  [facility] Department of [facility] [address] [place], [place] [phone] Accepts children up to age 72 who are enrolled in [place] or [place] Health Choice; pregnant women with a Medicaid card; and children who have applied for Medicaid or [place] Health Choice, but were declined, whose parents can pay a reduced fee at time of service.  [facility] of [facility]  [address], [place] [phone] Accepts children up to age 36 who are enrolled in [place] or [place] Health Choice; pregnant women with a Medicaid card; and children who have applied for Medicaid or [place] Health Choice, but were declined, whose parents can pay a reduced fee at time of service.  [facility]  [address] [place], [place] [phone] Patients are seen by appointment only. Walk-ins are not accepted. [facility] will see patients 61 years of age and older.  Monday - Tuesday (8am-5pm) Most Wednesdays (8:30-5pm) $30 per visit, cash only  [facility]  [address], [facility] [phone] Patients are seen by appointment only.  Walk-ins are not accepted. [facility] will see patients 74 years of age and older. One Wednesday Evening (Monthly: Volunteer Based).  $30 per visit, cash only  [employer]  [phone] for adults; Children under age 85, call Graduate Pediatric Dentistry at [phone]. Children aged 56-14, please call [phone] to request a pediatric application.  Dental services are provided in all areas of dental care including fillings, crowns and bridges, complete and partial dentures, implants, gum treatment, root canals, and extractions. Preventive care is also provided. Treatment is provided to both adults and children. Patients are selected via a lottery and there is often a waiting list.   [facility] [address], [place]  [phone] [email or web address]   [facility] [address] [place], [place] [phone], Ext. [phone] Second and Fourth Thursday of each month, opens at 6:30 AM; Clinic ends at 9 AM.  Patients are seen on a first-come first-served basis, and a limited number are seen during each clinic.   [facility]  [address] Ether Griffins [place], [place] [phone]   Eligibility Requirements You must have lived in [place], [place], or [place] counties for at least the last three months.   You cannot be eligible for state or federal sponsored [place], including [employer], [place], or [employer].   You generally cannot be eligible for healthcare insurance through your employer.    How to apply: Eligibility screenings are held every Tuesday and Wednesday afternoon from 1:00 pm until 4:00 pm. You do not need an appointment for the interview!  [facility] [address], [place], [place] [phone]   [facility] Health Department  [phone]   [facility] Health Department  [phone]   [facility] Health Department  [phone]    Behavioral Health  Resources in the Community: Intensive Outpatient Programs Organization         Address  Phone  Notes  [facility] [address] [place], [place] [phone]   [facility] [address], [place], [place] [phone]   ADS: Alcohol & [facility] [address] [place], [place]  [phone]   [facility] [address]  [place], [place] [phone] or [phone]   Substance Abuse Resources Organization         Address  Phone  Notes  [facility]  [phone]   [facility]  [phone]   [facility]  [phone]   Floydene Flock  [phone]   Residential & Outpatient Substance Abuse Program  [phone]   Psychological Services Organization         Address  Phone  Notes  [facility]  [phone]   [facility]  [phone]   [facility] [address], [place] [phone] or [phone]    Mobile Crisis Teams Organization         Address  Phone  Notes  [facility], Mobile Crisis Care Unit  [phone]   [facility]  [address] [place], [place] [phone]   Doristine Locks [address] [place] [phone]    Self-Help/Support Groups Organization         Address  Phone  Notes  [facility] [place] - variety of support groups  [phone] Call for more information  Narcotics Anonymous (NA), [facility] [address], [employer] [place]  2 meetings at this location   Statistician         Address  Phone  Notes  [facility] [address],    [place]  [phone]   [facility]  [address] [place] [address], [place], [place] [phone]   [facility] [address] [place], [place] [phone] Admissions: 8am-3pm M-F  [facility] [address],    [place], [place] [phone]   [facility] [address] [place], [place], [place] [phone]   [facility] [address],  [place], [place] [phone]   [facility] Intensive Outpatient [address], [place], [place] [phone]   [facility] ([facility] [address] [place]  [place], [place] [phone] or [phone]   [facility] ([facility] [address] [place], [place] [phone] Accepts Medicaid  [facility] [address],  [place] [phone] Substance Abuse/Addiction Treatment   [facility]         Address  Phone  Notes  [facility]  [phone]   Angie Fava, PhD [address] [place], [place]   [phone] or [phone]   [facility] Behavioral   [address] [place], [place] [phone]   [facility] [address] [place], [place] [phone] Insurance/Medicaid/sponsorship through [facility] [address]                                    [place], [place] [phone] Therapy/tele-psych/case  [facility] [address], [place] [phone]    Dr. Lolly Mustache  [phone]   [facility]  [employer] [facility] Dept. 1) [address] [place] 2) [address], [place] 3)  [address], [place] [phone]  [phone]   [facility] Child Abuse Hotline [phone])[phone] or [phone] (After Hours)

## 2014-01-17 NOTE — ED Provider Notes (Signed)
Medical screening examination/treatment/procedure(s) were performed by non-physician practitioner and as supervising physician I was immediately available for consultation/collaboration.   EKG Interpretation   Date/Time:  Saturday January 12 2014 18:12:15 EST Ventricular Rate:  94 PR Interval:  160 QRS Duration: 90 QT Interval:  431 QTC Calculation: 539 R Axis:   40 Text Interpretation:  Sinus rhythm Borderline repolarization abnormality  Prolonged QT interval Confirmed by Rubin PayorPICKERING  MD, Forest Redwine [phone]([id number]) on  01/12/2014 7:45:52 PM       Juliet RudeNathan R. Rubin PayorPickering, MD 01/17/14 [phone]

## 2014-01-22 ENCOUNTER — Emergency Department (HOSPITAL_COMMUNITY)
Admission: EM | Admit: 2014-01-22 | Discharge: 2014-01-23 | Disposition: A | Discharge status: Home / Self Care | Payer: Self-pay | Attending: Emergency Medicine | Admitting: Emergency Medicine

## 2014-01-22 ENCOUNTER — Encounter (HOSPITAL_COMMUNITY): Payer: Self-pay | Admitting: Emergency Medicine

## 2014-01-22 DIAGNOSIS — F17200 Nicotine dependence, unspecified, uncomplicated: Secondary | ICD-10-CM | POA: Insufficient documentation

## 2014-01-22 DIAGNOSIS — K760 Fatty (change of) liver, not elsewhere classified: Secondary | ICD-10-CM

## 2014-01-22 DIAGNOSIS — R109 Unspecified abdominal pain: Secondary | ICD-10-CM

## 2014-01-22 DIAGNOSIS — K7689 Other specified diseases of liver: Secondary | ICD-10-CM | POA: Insufficient documentation

## 2014-01-22 DIAGNOSIS — Z79899 Other long term (current) drug therapy: Secondary | ICD-10-CM | POA: Insufficient documentation

## 2014-01-22 DIAGNOSIS — Z791 Long term (current) use of non-steroidal anti-inflammatories (NSAID): Secondary | ICD-10-CM | POA: Insufficient documentation

## 2014-01-22 LAB — CBC WITH DIFFERENTIAL/PLATELET
BASOS ABS: 0 10*3/uL (ref 0.0–0.1)
BASOS PCT: 0 % (ref 0–1)
Eosinophils Absolute: 0.1 10*3/uL (ref 0.0–0.7)
Eosinophils Relative: 2 % (ref 0–5)
HEMATOCRIT: 38.6 % — AB (ref 39.0–52.0)
Hemoglobin: 13.4 g/dL (ref 13.0–17.0)
LYMPHS PCT: 47 % — AB (ref 12–46)
Lymphs Abs: 2.8 10*3/uL (ref 0.7–4.0)
MCH: 30 pg (ref 26.0–34.0)
MCHC: 34.7 g/dL (ref 30.0–36.0)
MCV: 86.5 fL (ref 78.0–100.0)
Monocytes Absolute: 0.5 10*3/uL (ref 0.1–1.0)
Monocytes Relative: 9 % (ref 3–12)
NEUTROS ABS: 2.5 10*3/uL (ref 1.7–7.7)
NEUTROS PCT: 42 % — AB (ref 43–77)
PLATELETS: 214 10*3/uL (ref 150–400)
RBC: 4.46 MIL/uL (ref 4.22–5.81)
RDW: 12.7 % (ref 11.5–15.5)
WBC: 5.9 10*3/uL (ref 4.0–10.5)

## 2014-01-22 LAB — URINALYSIS, ROUTINE W REFLEX MICROSCOPIC
Bilirubin Urine: NEGATIVE
GLUCOSE, UA: NEGATIVE mg/dL
HGB URINE DIPSTICK: NEGATIVE
KETONES UR: NEGATIVE mg/dL
LEUKOCYTES UA: NEGATIVE
Nitrite: NEGATIVE
PH: 5 (ref 5.0–8.0)
PROTEIN: NEGATIVE mg/dL
Specific Gravity, Urine: 1.013 (ref 1.005–1.030)
Urobilinogen, UA: 0.2 mg/dL (ref 0.0–1.0)

## 2014-01-22 NOTE — ED Notes (Signed)
Pt reports epigastric ab pain that 2 days ago. Pt has been taking prilosec with relief from heart burn but pain remains. Pt denies n/v. Pt states that he also has R lower back pain that has been going on for almost a year. Pt states there have been no changes in urination pattern. Pt alert and oriented.

## 2014-01-23 ENCOUNTER — Emergency Department (HOSPITAL_COMMUNITY): Payer: Self-pay

## 2014-01-23 LAB — COMPREHENSIVE METABOLIC PANEL
ALBUMIN: 4.3 g/dL (ref 3.5–5.2)
ALT: 80 U/L — AB (ref 0–53)
AST: 50 U/L — AB (ref 0–37)
Alkaline Phosphatase: 88 U/L (ref 39–117)
BILIRUBIN TOTAL: 0.2 mg/dL — AB (ref 0.3–1.2)
BUN: 13 mg/dL (ref 6–23)
CHLORIDE: 99 meq/L (ref 96–112)
CO2: 26 meq/L (ref 19–32)
CREATININE: 1 mg/dL (ref 0.50–1.35)
Calcium: 9.5 mg/dL (ref 8.4–10.5)
GFR calc Af Amer: >90 mL/min (ref >90)
Glucose, Bld: 88 mg/dL (ref 70–99)
POTASSIUM: 3.8 meq/L (ref 3.7–5.3)
SODIUM: 139 meq/L (ref 137–147)
Total Protein: 8 g/dL (ref 6.0–8.3)

## 2014-01-23 LAB — LIPASE, BLOOD: Lipase: 31 U/L (ref 11–59)

## 2014-01-23 MED ORDER — ONDANSETRON HCL 4 MG/2ML IJ SOLN
4.0000 mg | Freq: Once | INTRAMUSCULAR | Status: AC
  Administered 2014-01-23: 4 mg via INTRAVENOUS
  Filled 2014-01-23: qty 2

## 2014-01-23 MED ORDER — FENTANYL CITRATE 0.05 MG/ML IJ SOLN
50.0000 ug | INTRAMUSCULAR | Status: DC | PRN
  Administered 2014-01-23: 50 ug via INTRAVENOUS
  Filled 2014-01-23: qty 2

## 2014-01-23 MED ORDER — TRAMADOL HCL 50 MG PO TABS: 50.0000 mg | ORAL_TABLET | Freq: Four times a day (QID) | ORAL | Status: DC | PRN

## 2014-01-23 MED ORDER — ONDANSETRON HCL 4 MG PO TABS: 4.0000 mg | ORAL_TABLET | Freq: Four times a day (QID) | ORAL | Status: DC

## 2014-01-23 NOTE — ED Provider Notes (Signed)
CSN: [id number]     Arrival date & time 01/22/14  2301 History   First MD Initiated Contact with Patient 01/22/14 2344     Chief Complaint  Patient presents with  . Abdominal Pain     (Consider location/radiation/quality/duration/timing/severity/associated sxs/prior Treatment) HPI History provided by patient. Epigastric right upper quadrant abdominal pain onset 2 days ago. Patient are present severe at times and otherwise mild to moderate in severity. No known aggravating or alleviating factors. Has had multiple evaluations for this including a recent chest x-ray. He is also started taking Prilosec and despite taking this daily has persistent symptoms. No fevers or chills. Does not seem to be associated with food or eating. No difficulty breathing. No radiation of pain. Previous smoker.  No known alleviating factors.   History reviewed. No pertinent past medical history. History reviewed. No pertinent past surgical history. History reviewed. No pertinent family history. History  Substance Use Topics  . Smoking status: Current Some Day Smoker    Types: Cigarettes    Last Attempt to Quit: 10/24/2013  . Smokeless tobacco: Not on file  . Alcohol Use: Yes    Review of Systems  Constitutional: Negative for fever and chills.  Eyes: Negative for visual disturbance.  Respiratory: Negative for shortness of breath.   Cardiovascular: Negative for palpitations and leg swelling.  Gastrointestinal: Positive for abdominal pain. Negative for vomiting.  Genitourinary: Negative for dysuria and flank pain.  Musculoskeletal: Negative for back pain and neck pain.  Skin: Negative for rash.  Neurological: Negative for weakness and headaches.  All other systems reviewed and are negative.      Allergies  Review of patient's allergies indicates no known allergies.  Home Medications   Current Outpatient Rx  Name  Route  Sig  Dispense  Refill  . hydrochlorothiazide (HYDRODIURIL) 25 MG tablet  Oral   Take 1 tablet (25 mg total) by mouth daily.   30 tablet   1   . LORazepam (ATIVAN) 1 MG tablet   Oral   Take 1 tablet (1 mg total) by mouth every 6 (six) hours as needed for anxiety.   10 tablet   0   . omeprazole (PRILOSEC) 40 MG capsule   Oral   Take 1 capsule (40 mg total) by mouth daily.   30 capsule   1   . ibuprofen (ADVIL,MOTRIN) 600 MG tablet   Oral   Take 600 mg by mouth every 6 (six) hours as needed for moderate pain.          BP 143/87  Pulse 75  Temp(Src) 98.5 F (36.9 C) (Oral)  Resp 20  Ht 5\' 11"  (1.803 m)  Wt 210 lb (95.255 kg)  BMI 29.30 kg/m2  SpO2 99% Physical Exam  Constitutional: He is oriented to person, place, and time. He appears well-developed and well-nourished.  HENT:  Head: Normocephalic and atraumatic.  Eyes: EOM are normal. Pupils are equal, round, and reactive to light. No scleral icterus.  Neck: Neck supple.  Cardiovascular: Normal rate, regular rhythm and intact distal pulses.   Pulmonary/Chest: Effort normal and breath sounds normal. No respiratory distress. He exhibits no tenderness.  Abdominal: Soft. Bowel sounds are normal. He exhibits no distension. There is no rebound and no guarding.  Epigastric and right upper quadrant tenderness negative Murphy sign. No abdominal tenderness otherwise.  Musculoskeletal: Normal range of motion. He exhibits no edema.  Neurological: He is alert and oriented to person, place, and time. No cranial nerve deficit.  Skin: Skin  is warm and dry.    ED Course  Procedures (including critical care time) Labs Review Labs Reviewed  CBC WITH DIFFERENTIAL - Abnormal; Notable for the following:    HCT 38.6 (*)    Neutrophils Relative % 42 (*)    Lymphocytes Relative 47 (*)    All other components within normal limits  COMPREHENSIVE METABOLIC PANEL - Abnormal; Notable for the following:    AST 50 (*)    ALT 80 (*)    Total Bilirubin 0.2 (*)    All other components within normal limits  LIPASE,  BLOOD  URINALYSIS, ROUTINE W REFLEX MICROSCOPIC   Imaging Review [place] Abdomen Limited Ruq  01/23/2014   CLINICAL DATA Right upper quadrant and epigastric pain.  EXAM US ABDOMEN LIMITED - RIGHT UPPER QUADRANT  COMPARISON None.  FINDINGS Gallbladder:  No gallstones or wall thickening visualized. No sonographic Murphy sign noted.  Common bile duct:  Diameter: 2 mm, within normal limits.  Liver:  No focal lesion identified. Hepatic echogenicity mildly increased. Focal lesion detection is limited in this setting.  IMPRESSION No gallstones or sonographic evidence for cholecystitis.  Mildly increased hepatic echogenicity can be seen in the setting of steatosis or hepatitis.  SIGNATURE  Electronically Signed   By: Jearld LeschAndrew  DelGaizo M.D.   On: 01/23/2014 02:05     EKG Interpretation   Date/Time:  Tuesday January 22 2014 23:29:48 EDT Ventricular Rate:  77 PR Interval:  156 QRS Duration: 85 QT Interval:  353 QTC Calculation: 399 R Axis:   23 Text Interpretation:  Sinus rhythm Nonspecific ST abnormality No  significant change since last tracing Confirmed by Ludmila Ebarb  MD, Delaynee Alred  [phone]([id number]) on 01/23/2014 12:52:08 AM     IV fentanyl. IV Zofran. On recheck pain resolved.  Lab and imaging results your patient as above. Plan close outpatient followup. Referral provided. Prescription provided for pain medications as needed. Tylenol, alcohol, liver precautions provided.  MDM   Diagnosis: Abdominal pain, steatosis on ultrasound with mildly elevated liver enzymes  Symptoms improved with medications provided. Evaluated with labs, urinalysis and ultrasound reviewed as above. No gallstones or acute cholecystitis. Repeat abdominal exam benign. Vital signs and nursing notes reviewed and considered.    Sunnie NielsenBrian Haneef Hallquist, MD 01/23/14 [phone]

## 2014-01-23 NOTE — Discharge Instructions (Signed)
Fatty Liver °Fatty liver is the accumulation of fat in liver cells. It is also called hepatosteatosis or steatohepatitis. It is normal for your liver to contain some fat. If fat is more than 5 to 10% of your liver's weight, you have fatty liver.  °There are often no symptoms (problems) for years while damage is still occurring. People often learn about their fatty liver when they have medical tests for other reasons. Fat can damage your liver for years or even decades without causing problems. When it becomes severe, it can cause fatigue, weight loss, weakness, and confusion. °This makes you more likely to develop more serious liver problems. The liver is the largest organ in the body. It does a lot of work and often gives no warning signs when it is sick until late in a disease. °The liver has many important jobs including: °· Breaking down foods. °· Storing vitamins, iron, and other minerals. °· Making proteins. °· Making bile for food digestion. °· Breaking down many products including medications, alcohol and some poisons. °CAUSES  °There are a number of different conditions, medications, and poisons that can cause a fatty liver. Eating too many calories causes fat to build up in the liver. Not processing and breaking fats down normally may also cause this. Certain conditions, such as obesity, diabetes, and high triglycerides also cause this. Most fatty liver patients tend to be middle-aged and over weight.  °Some causes of fatty liver are: °· Alcohol over consumption. °· Malnutrition. °· Steroid use. °· Valproic acid toxicity. °· Obesity. °· Cushing's syndrome. °· Poisons. °· Tetracycline in high dosages. °· Pregnancy. °· Diabetes. °· Hyperlipidemia. °· Rapid weight loss. °Some people develop fatty liver even having none of these conditions. °SYMPTOMS  °Fatty liver most often causes no problems. This is called asymptomatic. °· It can be diagnosed with blood tests and also by a liver biopsy. °· It is one of the  most common causes of minor elevations of liver enzymes on routine blood tests. °· Specialized Imaging of the liver using ultrasound, CT (computed tomography) scan, or MRI (magnetic resonance imaging) can suggest a fatty liver but a biopsy is needed to confirm it. °· A biopsy involves taking a small sample of liver tissue. This is done by using a needle. It is then looked at under a microscope by a specialist. °TREATMENT  °It is important to treat the cause. Simple fatty liver without a medical reason may not need treatment. °· Weight loss, fat restriction, and exercise in overweight patients produces inconsistent results but is worth trying. °· Fatty liver due to alcohol toxicity may not improve even with stopping drinking. °· Good control of diabetes may reduce fatty liver. °· Lower your triglycerides through diet, medication or both. °· Eat a balanced, healthy diet. °· Increase your physical activity. °· Get regular checkups from a liver specialist. °· There are no medical or surgical treatments for a fatty liver or NASH, but improving your diet and increasing your exercise may help prevent or reverse some of the damage. °PROGNOSIS  °Fatty liver may cause no damage or it can lead to an inflammation of the liver. This is, called steatohepatitis. When it is linked to alcohol abuse, it is called alcoholic steatohepatitis. It often is not linked to alcohol. It is then called nonalcoholic steatohepatitis, or NASH. Over time the liver may become scarred and hardened. This condition is called cirrhosis. Cirrhosis is serious and may lead to liver failure or cancer. NASH is one of the   leading causes of cirrhosis. About 10-20% of Americans have fatty liver and a smaller 2-5% has NASH. °Document Released: 12/17/2005 Document Revised: 01/24/2012 Document Reviewed: 02/09/2006 °[employer]® Patient Information ©2014 [employer]. ° °

## 2014-01-24 ENCOUNTER — Telehealth (HOSPITAL_COMMUNITY): Payer: Self-pay

## 2014-01-24 NOTE — ED Notes (Signed)
Pt calling regarding referral to [facility].  He called and they said they needed a referral.  Current writer called and spoke w/Courtney and they will call pt to schedule an appt.  Pt informed.

## 2014-02-02 ENCOUNTER — Emergency Department (HOSPITAL_COMMUNITY)
Admission: EM | Admit: 2014-02-02 | Discharge: 2014-02-03 | Disposition: A | Discharge status: Home / Self Care | Payer: Self-pay | Attending: Emergency Medicine | Admitting: Emergency Medicine

## 2014-02-02 ENCOUNTER — Emergency Department (HOSPITAL_COMMUNITY): Payer: Self-pay

## 2014-02-02 ENCOUNTER — Encounter (HOSPITAL_COMMUNITY): Payer: Self-pay | Admitting: Emergency Medicine

## 2014-02-02 DIAGNOSIS — K760 Fatty (change of) liver, not elsewhere classified: Secondary | ICD-10-CM

## 2014-02-02 DIAGNOSIS — K7689 Other specified diseases of liver: Secondary | ICD-10-CM | POA: Insufficient documentation

## 2014-02-02 DIAGNOSIS — R109 Unspecified abdominal pain: Secondary | ICD-10-CM

## 2014-02-02 DIAGNOSIS — F17200 Nicotine dependence, unspecified, uncomplicated: Secondary | ICD-10-CM | POA: Insufficient documentation

## 2014-02-02 DIAGNOSIS — M549 Dorsalgia, unspecified: Secondary | ICD-10-CM | POA: Insufficient documentation

## 2014-02-02 DIAGNOSIS — K219 Gastro-esophageal reflux disease without esophagitis: Secondary | ICD-10-CM | POA: Insufficient documentation

## 2014-02-02 LAB — COMPREHENSIVE METABOLIC PANEL
ALK PHOS: 90 U/L (ref 39–117)
ALT: 69 U/L — ABNORMAL HIGH (ref 0–53)
AST: 43 U/L — ABNORMAL HIGH (ref 0–37)
Albumin: 4.4 g/dL (ref 3.5–5.2)
BILIRUBIN TOTAL: 0.3 mg/dL (ref 0.3–1.2)
BUN: 16 mg/dL (ref 6–23)
CHLORIDE: 97 meq/L (ref 96–112)
CO2: 27 mEq/L (ref 19–32)
CREATININE: 0.95 mg/dL (ref 0.50–1.35)
Calcium: 10.2 mg/dL (ref 8.4–10.5)
GFR calc Af Amer: >90 mL/min (ref >90)
GFR calc non Af Amer: >90 mL/min (ref >90)
Glucose, Bld: 86 mg/dL (ref 70–99)
POTASSIUM: 3.6 meq/L — AB (ref 3.7–5.3)
Sodium: 139 mEq/L (ref 137–147)
Total Protein: 7.9 g/dL (ref 6.0–8.3)

## 2014-02-02 LAB — URINALYSIS, ROUTINE W REFLEX MICROSCOPIC
BILIRUBIN URINE: NEGATIVE
Glucose, UA: NEGATIVE mg/dL
Hgb urine dipstick: NEGATIVE
KETONES UR: NEGATIVE mg/dL
Leukocytes, UA: NEGATIVE
NITRITE: NEGATIVE
Protein, ur: NEGATIVE mg/dL
Specific Gravity, Urine: 1.014 (ref 1.005–1.030)
UROBILINOGEN UA: 0.2 mg/dL (ref 0.0–1.0)
pH: 5.5 (ref 5.0–8.0)

## 2014-02-02 LAB — CBC WITH DIFFERENTIAL/PLATELET
Basophils Absolute: 0 10*3/uL (ref 0.0–0.1)
Basophils Relative: 0 % (ref 0–1)
Eosinophils Absolute: 0.1 10*3/uL (ref 0.0–0.7)
Eosinophils Relative: 2 % (ref 0–5)
HEMATOCRIT: 37.8 % — AB (ref 39.0–52.0)
HEMOGLOBIN: 13.2 g/dL (ref 13.0–17.0)
LYMPHS ABS: 2.6 10*3/uL (ref 0.7–4.0)
Lymphocytes Relative: 43 % (ref 12–46)
MCH: 30.1 pg (ref 26.0–34.0)
MCHC: 34.9 g/dL (ref 30.0–36.0)
MCV: 86.3 fL (ref 78.0–100.0)
MONO ABS: 0.6 10*3/uL (ref 0.1–1.0)
Monocytes Relative: 10 % (ref 3–12)
NEUTROS PCT: 45 % (ref 43–77)
Neutro Abs: 2.7 10*3/uL (ref 1.7–7.7)
Platelets: 213 10*3/uL (ref 150–400)
RBC: 4.38 MIL/uL (ref 4.22–5.81)
RDW: 12.8 % (ref 11.5–15.5)
WBC: 6 10*3/uL (ref 4.0–10.5)

## 2014-02-02 LAB — TROPONIN I: Troponin I: <0.3 ng/mL (ref <0.30)

## 2014-02-02 LAB — LIPASE, BLOOD: LIPASE: 47 U/L (ref 11–59)

## 2014-02-02 MED ORDER — IOHEXOL 300 MG/ML  SOLN: 100.0000 mL | Freq: Once | INTRAMUSCULAR | Status: AC | PRN

## 2014-02-02 MED ORDER — MORPHINE SULFATE 4 MG/ML IJ SOLN
4.0000 mg | Freq: Once | INTRAMUSCULAR | Status: AC
  Administered 2014-02-02: 4 mg via INTRAVENOUS
  Filled 2014-02-02: qty 1

## 2014-02-02 MED ORDER — IOHEXOL 300 MG/ML  SOLN
50.0000 mL | Freq: Once | INTRAMUSCULAR | Status: AC | PRN
  Administered 2014-02-02: 50 mL via ORAL

## 2014-02-02 MED ORDER — SODIUM CHLORIDE 0.9 % IV BOLUS (SEPSIS)
1000.0000 mL | Freq: Once | INTRAVENOUS | Status: AC
  Administered 2014-02-02: 1000 mL via INTRAVENOUS

## 2014-02-02 MED ORDER — ONDANSETRON HCL 4 MG/2ML IJ SOLN
4.0000 mg | Freq: Once | INTRAMUSCULAR | Status: AC
  Administered 2014-02-02: 4 mg via INTRAVENOUS
  Filled 2014-02-02: qty 2

## 2014-02-02 NOTE — ED Notes (Signed)
Pt arrived to the Ed with a complaint of abdominal pain. Pt was seen two weeks ago for same but symptoms of pain in the lower abdomen, which had now shifted to the upper anterior flanks.  Pt has been having normal bowel movements and regular urination patterns.

## 2014-02-02 NOTE — ED Provider Notes (Signed)
CSN: [id number]     Arrival date & time 02/02/14  1901 History   First MD Initiated Contact with Patient 02/02/14 1943     Chief Complaint  Patient presents with  . Abdominal Pain     (Consider location/radiation/quality/duration/timing/severity/associated sxs/prior Treatment) The history is provided by the patient. No language interpreter was used.  Douglas Wong is a 33 year old male with no known significant past medical history presenting to the ED with abdominal pain does been ongoing for one month. Patient reports that in the beginning of the month his abdominal pain is localized to lower quadrants described as a cramping sensation. Patient reported that now the abdominal pain is localized to upper quadrants described as a bloating, gassy, cramping sensation that is intermittent. Stated he gets at least one episode per day. Stated that he is pleased muscle tightening sensations that occurred intermittently. Denied radiation of the pain. Stated that in his chest he at times experiences a sharp discomfort underneath his breasts bilaterally. Reported that he was diagnosed with a fatty liver with you decrease fat intake, but needs. Reported normal bowel movements. Reported increased flatulence. Stated he was seen and assessed by a gastroenterologist not too long ago where nothing was performed and no recommendations were given as per patient report. Denied chest pain, shortness of breath, difficulty breathing, fever, chills, nausea, vomiting, diarrhea, melena, hematochezia, urinary symptoms, blurred vision, changes to bowel movements, fainting, dizziness, history of blood clots, hematemesis, increased icy intake. PCP none  History reviewed. No pertinent past medical history. History reviewed. No pertinent past surgical history. History reviewed. No pertinent family history. History  Substance Use Topics  . Smoking status: Current Some Day Smoker    Types: Cigarettes    Last Attempt to Quit:  10/24/2013  . Smokeless tobacco: Not on file  . Alcohol Use: Yes    Review of Systems  Constitutional: Negative for fever and chills.  Respiratory: Positive for chest tightness. Negative for shortness of breath.   Cardiovascular: Negative for chest pain.  Gastrointestinal: Positive for abdominal pain. Negative for nausea, vomiting, diarrhea, constipation, blood in stool and anal bleeding.  Genitourinary: Negative for hematuria and decreased urine volume.  Musculoskeletal: Positive for back pain.  Neurological: Negative for dizziness and weakness.  All other systems reviewed and are negative.      Allergies  Review of patient's allergies indicates no known allergies.  Home Medications   Current Outpatient Rx  Name  Route  Sig  Dispense  Refill  . hydrochlorothiazide (HYDRODIURIL) 25 MG tablet   Oral   Take 1 tablet (25 mg total) by mouth daily.   30 tablet   1   . ibuprofen (ADVIL,MOTRIN) 600 MG tablet   Oral   Take 600 mg by mouth every 6 (six) hours as needed for moderate pain.         . Multiple Vitamins-Minerals (CENTRUM ADULTS PO)   Oral   Take 1 tablet by mouth daily.         . Probiotic Product (ALIGN) 4 MG CAPS   Oral   Take 4 mg by mouth daily.         . traMADol (ULTRAM) 50 MG tablet   Oral   Take 50 mg by mouth every 6 (six) hours as needed for moderate pain.         Marland Kitchen omeprazole (PRILOSEC) 20 MG capsule   Oral   Take 1 capsule (20 mg total) by mouth daily.   11 capsule  0   . ondansetron (ZOFRAN) 4 MG tablet   Oral   Take 1 tablet (4 mg total) by mouth every 6 (six) hours.   12 tablet   0    BP 144/88  Pulse 61  Temp(Src) 98.6 F (37 C) (Oral)  Resp 18  SpO2 100% Physical Exam  Nursing note and vitals reviewed. Constitutional: He is oriented to person, place, and time. He appears well-developed and well-nourished. No distress.  HENT:  Head: Normocephalic and atraumatic.  Mouth/Throat: Oropharynx is clear and moist. No  oropharyngeal exudate.  Eyes: Conjunctivae and EOM are normal. Pupils are equal, round, and reactive to light. Right eye exhibits no discharge. Left eye exhibits no discharge.  Neck: Normal range of motion. Neck supple. No tracheal deviation present.  Cardiovascular: Normal rate, regular rhythm and normal heart sounds.  Exam reveals no friction rub.   No murmur heard. Pulses:      Radial pulses are 2+ on the right side, and 2+ on the left side.       Dorsalis pedis pulses are 2+ on the right side, and 2+ on the left side.  Cap refill less than 3 seconds  Pulmonary/Chest: Effort normal and breath sounds normal. No respiratory distress. He has no wheezes. He has no rales. He exhibits tenderness.  Discomfort upon palpation to the chest wall bilaterally and sternal Patient is able to speak in full senses that difficulty Negative use of accessory muscles  Abdominal: Soft. Normal appearance and bowel sounds are normal. He exhibits no distension. There is tenderness in the right upper quadrant, epigastric area and left upper quadrant. There is no guarding and no CVA tenderness.    Negative abdominal distention Abdomen soft upon palpation Discomfort upon palpation to the upper quadrants bilaterally and epigastric region Positive Murphy's sign Negative McBurney's point  Musculoskeletal: Normal range of motion.  Full ROM to upper and lower extremities without difficulty noted, negative ataxia noted.  Lymphadenopathy:    He has no cervical adenopathy.  Neurological: He is alert and oriented to person, place, and time. No cranial nerve deficit. He exhibits normal muscle tone. Coordination normal.  Cranial nerves III-XII grossly intact Strength 5+/5+ to upper and lower extremities bilaterally with resistance applied, equal distribution noted Equal grip strength  Skin: Skin is warm and dry. No rash noted. He is not diaphoretic. No erythema.  Psychiatric: He has a normal mood and affect. His behavior  is normal. Thought content normal.    ED Course  Procedures (including critical care time)  Results for orders placed during the hospital encounter of 02/02/14  CBC WITH DIFFERENTIAL      Result Value Ref Range   WBC 6.0  4.0 - 10.5 K/uL   RBC 4.38  4.22 - 5.81 MIL/uL   Hemoglobin 13.2  13.0 - 17.0 g/dL   HCT 44.037.8 (*) 10.239.0 - 72.552.0 %   MCV 86.3  78.0 - 100.0 fL   MCH 30.1  26.0 - 34.0 pg   MCHC 34.9  30.0 - 36.0 g/dL   RDW 36.612.8  44.011.5 - 34.715.5 %   Platelets 213  150 - 400 K/uL   Neutrophils Relative % 45  43 - 77 %   Neutro Abs 2.7  1.7 - 7.7 K/uL   Lymphocytes Relative 43  12 - 46 %   Lymphs Abs 2.6  0.7 - 4.0 K/uL   Monocytes Relative 10  3 - 12 %   Monocytes Absolute 0.6  0.1 - 1.0 K/uL  Eosinophils Relative 2  0 - 5 %   Eosinophils Absolute 0.1  0.0 - 0.7 K/uL   Basophils Relative 0  0 - 1 %   Basophils Absolute 0.0  0.0 - 0.1 K/uL  COMPREHENSIVE METABOLIC PANEL      Result Value Ref Range   Sodium 139  137 - 147 mEq/L   Potassium 3.6 (*) 3.7 - 5.3 mEq/L   Chloride 97  96 - 112 mEq/L   CO2 27  19 - 32 mEq/L   Glucose, Bld 86  70 - 99 mg/dL   BUN 16  6 - 23 mg/dL   Creatinine, Ser 1.30  0.50 - 1.35 mg/dL   Calcium 86.5  8.4 - 78.4 mg/dL   Total Protein 7.9  6.0 - 8.3 g/dL   Albumin 4.4  3.5 - 5.2 g/dL   AST 43 (*) 0 - 37 U/L   ALT 69 (*) 0 - 53 U/L   Alkaline Phosphatase 90  39 - 117 U/L   Total Bilirubin 0.3  0.3 - 1.2 mg/dL   GFR calc non Af Amer >90  >90 mL/min   GFR calc Af Amer >90  >90 mL/min  LIPASE, BLOOD      Result Value Ref Range   Lipase 47  11 - 59 U/L  URINALYSIS, ROUTINE W REFLEX MICROSCOPIC      Result Value Ref Range   Color, Urine YELLOW  YELLOW   APPearance CLEAR  CLEAR   Specific Gravity, Urine 1.014  1.005 - 1.030   pH 5.5  5.0 - 8.0   Glucose, UA NEGATIVE  NEGATIVE mg/dL   Hgb urine dipstick NEGATIVE  NEGATIVE   Bilirubin Urine NEGATIVE  NEGATIVE   Ketones, ur NEGATIVE  NEGATIVE mg/dL   Protein, ur NEGATIVE  NEGATIVE mg/dL    Urobilinogen, UA 0.2  0.0 - 1.0 mg/dL   Nitrite NEGATIVE  NEGATIVE   Leukocytes, UA NEGATIVE  NEGATIVE  TROPONIN I      Result Value Ref Range   Troponin I <0.30  <0.30 ng/mL  TROPONIN I      Result Value Ref Range   Troponin I <0.30  <0.30 ng/mL    Labs Review Labs Reviewed  CBC WITH DIFFERENTIAL - Abnormal; Notable for the following:    HCT 37.8 (*)    All other components within normal limits  COMPREHENSIVE METABOLIC PANEL - Abnormal; Notable for the following:    Potassium 3.6 (*)    AST 43 (*)    ALT 69 (*)    All other components within normal limits  LIPASE, BLOOD  URINALYSIS, ROUTINE W REFLEX MICROSCOPIC  TROPONIN I  TROPONIN I   Imaging Review Dg Chest 2 View  02/02/2014   CLINICAL DATA:  Upper abdominal pain  EXAM: CHEST  2 VIEW  COMPARISON:  None.  FINDINGS: Normal mediastinum and cardiac silhouette. Normal pulmonary vasculature. No evidence of effusion, infiltrate, or pneumothorax. No acute bony abnormality.  IMPRESSION: No acute cardiopulmonary process.   Electronically Signed   By: Genevive Bi M.D.   On: 02/02/2014 22:06   Ct Abdomen Pelvis W Contrast  02/03/2014   CLINICAL DATA:  Two week history of abdominal pain.  EXAM: CT ABDOMEN AND PELVIS WITH CONTRAST  TECHNIQUE: Multidetector CT imaging of the abdomen and pelvis was performed using the standard protocol following bolus administration of intravenous contrast.  CONTRAST:  OMNIPAQUE IOHEXOL 300 MG/ML IV. Oral contrast was also administered.  COMPARISON:  None.  FINDINGS: [place] diffuse  hepatic steatosis without focal hepatic parenchymal abnormality. Normal-appearing spleen, pancreas, adrenal glands, kidneys, and gallbladder. No biliary ductal dilation. Normal vascular structures. No significant lymphadenopathy.  Normal appearing stomach. Oral contrast material in the distal esophagus without evidence of hiatal hernia. Normal appearing small bowel and colon. Normal appendix in the right upper and mid pelvis.  No ascites.  Urinary bladder unremarkable. Prostate gland and seminal vesicles normal for age.  Bone window images unremarkable.  IMPRESSION: 1. No acute abnormality involving the abdomen or pelvis. 2. Mild diffuse hepatic steatosis. 3. Oral contrast material in the distal esophagus without evidence of hiatal hernia. This can be seen with GE reflux and/or esophageal dysmotility.   Electronically Signed   By: Hulan Saas M.D.   On: 02/03/2014 00:53     EKG Interpretation   Date/Time:  Saturday February 02 2014 21:12:52 EDT Ventricular Rate:  66 PR Interval:  172 QRS Duration: 104 QT Interval:  371 QTC Calculation: 389 R Axis:   40 Text Interpretation:  Sinus rhythm ST elev, probable normal early repol  pattern No significant change since last tracing Confirmed by DOCHERTY   MD, MEGAN [id number]) on 02/02/2014 11:53:31 PM      MDM   Final diagnoses:  GERD (gastroesophageal reflux disease)  Hepatic steatosis  Abdominal pain   Medications  iohexol (OMNIPAQUE) 300 MG/ML solution 100 mL (not administered)  sodium chloride 0.9 % bolus 1,000 mL (0 mLs Intravenous Stopped 02/03/14 0125)  morphine 4 MG/ML injection 4 mg (4 mg Intravenous Given 02/02/14 2115)  ondansetron (ZOFRAN) injection 4 mg (4 mg Intravenous Given 02/02/14 2115)  iohexol (OMNIPAQUE) 300 MG/ML solution 50 mL (50 mLs Oral Contrast Given 02/02/14 2341)  iohexol (OMNIPAQUE) 300 MG/ML solution 100 mL (100 mLs Intravenous Contrast Given 02/03/14 0006)  gi cocktail (Maalox,Lidocaine,Donnatal) (30 mLs Oral Given 02/03/14 0125)   Filed Vitals:   02/02/14 1919 02/03/14 0047  BP: 143/93 144/88  Pulse:  61  Temp: 98.6 F (37 C)   TempSrc: Oral   Resp: 18 18  SpO2: 98% 100%    Patient presenting to the ED with abdominal pain does been ongoing for the past month. Stated that he's been having increased discomfort localized to the upper quadrants bilaterally described as a cramping, bloating, gaseous sensation. Stated he had a least  one episode per day. Reported mild chest discomfort localized underneath the breasts bilaterally described as a sharp sensation without radiation. Reported normal bowel movements. Patient reported that he was seen and assessed by a gastroenterologist not too long ago who did not do anything for him or make any recommendations as per patient report. This provider reviewed patient's chart. Patient was seen and assessed on 01/22/2014 where labs and imaging were performed. Elevated AST and ALT were identified with the right upper quadrant ultrasound identified possible liver steatosis or hepatitis. Patient was discharged recommended to followup with gastroenterology. Alert and oriented. GCS 15. Heart rate and rhythm normal. Lungs clear to auscultation to upper and lower lobes bilaterally. Radial and DP pulses 2+ bilaterally. Cap refill less than 3 seconds. Bowel sounds normal active in all 4 quadrants with discomfort upon palpation to the upper quadrants bilaterally-right and left as well as epigastric region. Positive Murphy's sign. Negative McBurney's point. Negative abdominal distention. Negative CVA tenderness bilaterally. Discomfort upon palpation to the chest wall bilaterally. Full range of motion to upper and lower extremities bilaterally. EKG noted normal sinus rhythm with a heart rate of 66 beats per minute with mild ST segment elevation is  probably early repolarization-negative significant changes noted or changes from last tracing identified. First troponin negative elevation. Second troponin negative elevation. CBC negative elevation white blood cell count noted-negative left shift or leukocytosis. CMP noted potassium to be mildly low at 3.6. ALT and AST noted to be elevated-have actually improved when compared to levels approximately one week ago. AST decreased from 50 to 43 and ALT has decreased from 80 to 69. Lipase negative elevation. Urinalysis negative for infection-negative nitrites or leukocytosis  identified. Chest x-ray negative for acute cardiac coronary disease. CT abdomen and pelvis with contrast noted-negative acute abdominal or pelvic abnormalities. Mild diffuse hepatic stenosis noted. Oral contrast material in the distal esophagus without evidence of hiatal hernia-this can be seen with GE reflux.  Doubt acute appendicitis. Doubt acute pancreatitis. Doubt acute abdominal processes. Doubt kidney stones. Doubt cardiac issue: HEART score 1, Well's Score low - doubt PE. Suspicion to be possible GERD cannot rule out possible esophageal dysmotility. Fatty liver again seen - will need to be monitored as an outpatient - monitor liver enzymes and routine [place]. Patient stable, afebrile. Patient tolerated pain medications well. GI cocktail aided patient. Discharged patient. Discussed with patient to rest and stay hydrated. [facility]erred [facility] and [facility]. Discussed with patient that he will most likely need a upper GI endoscopy and manometry performed. Discussed with patient proper diet. Discharged patient with PPI. Discussed with patient to closely monitor symptoms and if symptoms are to worsen or change to report back to the ED - strict return instructions given.  Patient agreed to plan of care, understood, all questions answered.   Raymon Mutton, PA-C 02/03/14 1005

## 2014-02-03 LAB — TROPONIN I: Troponin I: <0.3 ng/mL (ref <0.30)

## 2014-02-03 MED ORDER — OMEPRAZOLE 20 MG PO CPDR: 20.0000 mg | DELAYED_RELEASE_CAPSULE | Freq: Every day | ORAL | Status: DC

## 2014-02-03 MED ORDER — GI COCKTAIL ~~LOC~~
30.0000 mL | Freq: Once | ORAL | Status: AC
  Administered 2014-02-03: 30 mL via ORAL
  Filled 2014-02-03: qty 30

## 2014-02-03 MED ORDER — ONDANSETRON HCL 4 MG PO TABS: 4.0000 mg | ORAL_TABLET | Freq: Four times a day (QID) | ORAL | Status: DC

## 2014-02-03 MED ORDER — IOHEXOL 300 MG/ML  SOLN
100.0000 mL | Freq: Once | INTRAMUSCULAR | Status: AC | PRN
  Administered 2014-02-03: 100 mL via INTRAVENOUS

## 2014-02-03 NOTE — ED Provider Notes (Signed)
Medical screening examination/treatment/procedure(s) were performed by non-physician practitioner and as supervising physician I was immediately available for consultation/collaboration.    Megan E Docherty, MD 02/03/14 1136 

## 2014-02-03 NOTE — Discharge Instructions (Signed)
Please call and set-up an appointment with [facility] Please call and set-up an appointment with Gastroenterology - will need to get upper GI endoscopy performed most likely if symptoms worsen or continue Please call and set-up an appointment with General Surgery to be monitored regarding fatty liver - will need to monitor liver enzymes and routine ultrasounds to be performed Please rest and stay hydrated Please avoid food high in grease, fat, acidity - please avoid alcohol  Please rest and stay hydrated  Please drink plenty of water  Please continue to monitor symtpoms and if symptoms are to worsen or change (fever greater than 101, chills, sweating, nausea, vomiting, chest pain, shortness of breath, difficulty breathing, worsening or changes to abdominal pain, blood in stools, black tarry stools, blood in the urine, dizziness, confusion, weakness, blood in vomit, inability to keep food or fluids down) please report back to the ED immediately  Diet for Gastroesophageal Reflux Disease, Adult Reflux (acid reflux) is when acid from your stomach flows up into the esophagus. When acid comes in contact with the esophagus, the acid causes irritation and soreness (inflammation) in the esophagus. When reflux happens often or so severely that it causes damage to the esophagus, it is called gastroesophageal reflux disease (GERD). Nutrition therapy can help ease the discomfort of GERD. FOODS OR DRINKS TO AVOID OR LIMIT  Smoking or chewing tobacco. Nicotine is one of the most potent stimulants to acid production in the gastrointestinal tract.  Caffeinated and decaffeinated coffee and black tea.  Regular or low-calorie carbonated beverages or energy drinks (caffeine-free carbonated beverages are allowed).   Strong spices, such as black pepper, white pepper, red pepper, cayenne, curry powder, and chili powder.  Peppermint or spearmint.  Chocolate.  High-fat foods, including meats and  fried foods. Extra added fats including oils, butter, salad dressings, and nuts. Limit these to less than 8 tsp per day.  Fruits and vegetables if they are not tolerated, such as citrus fruits or tomatoes.  Alcohol.  Any food that seems to aggravate your condition. If you have questions regarding your diet, call your caregiver or a registered dietitian. OTHER THINGS THAT MAY HELP GERD INCLUDE:   Eating your meals slowly, in a relaxed setting.  Eating 5 to 6 small meals per day instead of 3 large meals.  Eliminating food for a period of time if it causes distress.  Not lying down until 3 hours after eating a meal.  Keeping the head of your bed raised 6 to 9 inches (15 to 23 cm) by using a foam wedge or blocks under the legs of the bed. Lying flat may make symptoms worse.  Being physically active. Weight loss may be helpful in reducing reflux in overweight or obese adults.  Wear loose fitting clothing EXAMPLE MEAL PLAN This meal plan is approximately 2,000 calories based on https://www.bernard.org/ meal planning guidelines. Breakfast   cup cooked oatmeal.  1 cup strawberries.  1 cup low-fat milk.  1 oz almonds. Snack  1 cup cucumber slices.  6 oz yogurt (made from low-fat or fat-free milk). Lunch  2 slice whole-wheat bread.  2 oz sliced [place].  2 tsp mayonnaise.  1 cup blueberries.  1 cup snap peas. Snack  6 whole-wheat crackers.  1 oz string cheese. Dinner   cup brown rice.  1 cup mixed veggies.  1 tsp olive oil.  3 oz grilled fish. Document Released: 11/01/2005 Document Revised: 01/24/2012 Document Reviewed: 09/17/2011 [facility] Patient Information 2014 [place], [place].  Diet for Gastroesophageal Reflux Disease, Adult Reflux is when stomach acid flows up into the esophagus. The esophagus becomes irritated and sore (inflammation). When reflux happens often and is severe, it is called gastroesophageal reflux disease (GERD). What you eat can help ease any  discomfort caused by GERD. FOODS OR DRINKS TO AVOID OR LIMIT  Coffee and black tea, with or without caffeine.  Bubbly (carbonated) drinks with caffeine or energy drinks.  Strong spices, such as pepper, cayenne pepper, curry, or chili powder.  Peppermint or spearmint.  Chocolate.  High-fat foods, such as meats, fried food, oils, butter, or nuts.  Fruits and vegetables that cause discomfort. This includes citrus fruits and tomatoes.  Alcohol. If a certain food or drink irritates your GERD, avoid eating or drinking it. THINGS THAT MAY HELP GERD INCLUDE:  Eat meals slowly.  Eat 5 to 6 small meals a day, not 3 large meals.  Do not eat food for a certain amount of time if it causes discomfort.  Wait 3 hours after eating before lying down.  Keep the head of your bed raised 6 to 9 inches (15 23 centimeters). Put a foam wedge or blocks under the legs of the bed.  Stay active. Weight loss, if needed, may help ease your discomfort.  Wear loose-fitting clothing.  Do not smoke or chew tobacco. Document Released: 05/02/2012 Document Reviewed: 05/02/2012 [facility] Patient Information 2014 [place], [place].   Emergency Department Resource Guide 1) Find a Doctor and Pay Out of Pocket Although you won't have to find out who is covered by your insurance plan, it is a good idea to ask around and get recommendations. You will then need to call the office and see if the doctor you have chosen will accept you as a new patient and what types of options they offer for patients who are self-pay. Some doctors offer discounts or will set up payment plans for their patients who do not have insurance, but you will need to ask so you aren't surprised when you get to your appointment.  2) Contact Your Local Health Department Not all health departments have doctors that can see patients for sick visits, but many do, so it is worth a call to see if yours does. If you don't know where your local health  department is, you can check in your phone book. The CDC also has a tool to help you locate your state's health department, and many state websites also have listings of all of their local health departments.  3) Find a Walk-in Clinic If your illness is not likely to be very severe or complicated, you may want to try a walk in clinic. These are popping up all over the country in pharmacies, drugstores, and shopping centers. They're usually staffed by nurse practitioners or physician assistants that have been trained to treat common illnesses and complaints. They're usually fairly quick and inexpensive. However, if you have serious medical issues or chronic medical problems, these are probably not your best option.  No Primary Care Doctor: - Call Health Connect at  [phone] - they can help you locate a primary care doctor that  accepts your insurance, provides certain services, etc. - Physician Referral Service- [phone]  Chronic Pain Problems: Organization         Address  Phone   Notes  [facility]  [phone] Patients need to be referred by their primary care doctor.   Medication Assistance: Organization  Address  Phone   Notes  [facility] Medication [facility] [place], [place] [address] [phone] --Must be a resident of [facility] -- Must have NO insurance coverage whatsoever (no Medicaid/ Medicare, etc.) -- The pt. MUST have a primary care doctor that directs their care regularly and follows them in the community   MedAssist  [phone]   [employer]  [phone]    Agencies that provide inexpensive medical care: Organization         Address  Phone   Notes  [place]  [phone]   [facility]    [phone]   [facility] [place], [place] [address] [phone]   [place] [address] [place] [phone]   Planned Parenthood    [phone]   [facility]    [phone]   [place] and [place] [address], [place] Phone:  [phone], Fax:  [phone] Hours of Operation:  9 am - 6 pm, M-F.  Also accepts Medicaid/Medicare and self-pay.  [facility] for [place], [address], [place] Phone: [phone], Fax: [phone]. Hours of Operation:  8:30 am - 5:30 pm, M-F.  Also accepts Medicaid and self-pay.  [facility] [address] [place] Phone: [phone]   [place], [place], [place] [phone], Ext. [phone] Mondays & Thursdays: 7-9 AM.  First 15 patients are seen on a first come, first serve basis.    [place] Providers:  Organization         Address  Phone   Notes  [facility] [address], [place] [phone] Also accepts self-pay patients.  [facility] [address], [place]  [phone]   [place], [address], [place] [phone]   [facility] [address] [place] [phone]   Lucianne Lei [address], [place]   [phone] Only accepts [place] Access [place] patients after they have their name applied to their card.   Self-Pay (no insurance) in [facility]:  Organization         Address  Phone   Notes  Sickle Cell Patients, [facility] [place] [phone]   [facility] [place] [phone]   [facility] [place]  [place], [place], [place] [phone]   [facility]/Dr. Osei-Bonsu  [address], [place] or [address], [place] [phone] Phone number for both [place] and  [place] locations is the same.  [facility] [address] [place] [phone]   [facility] [address], [place] or [address] [phone]   [facility] [address] [place] [phone], phone; [phone], fax Sees patients 1st and 3rd Saturday of every month.  Must not qualify for public or private insurance (i.e. Medicaid, Medicare, [place] Health Choice, Veterans' Benefits)  Household income should be no more than 200% of the poverty level The clinic cannot treat you if you are pregnant or think you  are pregnant  Sexually transmitted diseases are not treated at the clinic.    Dental Care: Organization         Address  Phone  Notes  [facility] Department of [facility] [place] [phone] Accepts children up to age 55 who are enrolled in [place] or [place]; pregnant women with a Medicaid card; and children who have applied for Medicaid or [place] Health Choice, but were declined, whose parents can pay a reduced fee at time of service.  [facility] of [facility]  [address], [place] [phone] Accepts children up to age 76 who are enrolled in [place] or [place]; pregnant women with a Medicaid card; and children who have applied for Medicaid or [place] Health Choice, but were declined, whose parents can pay a reduced fee at time of service.  [place] Adult [facility] Access PROGRAM  [place] [phone] Patients are seen by appointment only. Walk-ins are not accepted. [place] will see patients 72 years of age and older. Monday - Tuesday (8am-5pm) Most Wednesdays (8:30-5pm) $30 per visit, cash only  [facility] Adult Dental Access PROGRAM  [address], [facility] [phone] Patients are seen by appointment only. Walk-ins are not accepted.  [place] will see patients 84 years of age and older. One Wednesday Evening (Monthly: Volunteer Based).  $30 per visit, cash only  [place]  [phone] for adults; Children under age 76, call Graduate Pediatric Dentistry at [phone]. Children aged 34-14, please call [phone] to request a pediatric application.  Dental services are provided in all areas of dental care including fillings, crowns and bridges, complete and partial dentures, implants, gum treatment, root canals, and extractions. Preventive care is also provided. Treatment is provided to both adults and children. Patients are selected via a lottery and there is often a waiting list.   [facility] [address] [place]  [phone] [email or web address]   [facility] [address] [place], [place] [phone], Ext. [phone] Second and Fourth Thursday of each month, opens at 6:30 AM; Clinic ends at 9 AM.  Patients are seen on a first-come first-served basis, and a limited number are seen during each clinic.   [facility]  [address] Hillard Danker [place], [place] [phone]   Eligibility Requirements You must have lived in [place], [place], or [place] counties for at least the last three months.   You cannot be eligible for state or federal sponsored [employer], including [employer], [place], or [employer].   You generally cannot be eligible for healthcare insurance through your employer.    How to apply: Eligibility screenings are held every Tuesday and Wednesday afternoon from 1:00 pm until 4:00 pm. You do not need an appointment for the interview!  [facility] [address], [place], [place]   [place]  [place] Department  [place]  [phone]    Behavioral Health Resources in the  Community: Intensive Outpatient Programs Organization         Address  Phone  Notes  [place]. [address] [place], [place] [phone]   [facility] [address] [place], [place]   ADS: [facility] Drug [facility] [address], [place], [place]   [place] [address]  [place], [place] or [phone]   Substance Abuse Resources Organization         Address  Phone  Notes  Al[facility] Drug Services  [phone]   [place]  [phone]   [facility]   Chinita Pester  [phone]   Residential & Outpatient Substance Abuse Program  [phone]   Psychological Services Organization         Address  Phone  Notes  [facility] [place]  [place]  [phone]   [place] [address] [place] or [phone]    Mobile Crisis Teams Organization         Address  Phone  Notes  [facility], Mobile Crisis Care Unit  [phone]   [facility]  [address] [place], [place]   [place] Levels [address] [place] [phone]    Self-Help/Support Groups Organization         Address  Phone             Notes  [place]. of [place] - variety of support groups  Jane Lew Call for more information  Narcotics Anonymous (NA), [facility] [address], [employer] [place]  2 meetings at this location   Special educational needs teacher         Address  Phone  Notes  [facility] Residential Treatment [place],    [place]  [phone]   [facility]  [address] [place] [phone], [place], [place]   [place], [place] [phone] Admissions: 8am-3pm M-F  Incentives Substance [place] [address],    [place], [place] [phone]   [facility] [address] [place], [place], [place]   [facility] [address]  [place], [place]   [facility] Programs - Intensive Outpatient [address] Kristeen [address], [place], [place]   [facility] ([place].,  [place], [place] [phone] or [phone]   [facility] Services (RTS) [address] [place], [place] Accepts Medicaid  [facility] [address]  [place] [phone] Substance Abuse/Addiction Treatment   [facility] Organization         Address  Phone  Notes  [facility]  [phone]   Domenic Schwab, PhD [address] Arlis [place], [place]   [phone] or [phone]   [place], [place] [phone]   [facility] [address] [place], [place] [phone] Insurance/Medicaid/sponsorship through [facility] and Families [address], [address] [place]                                    [place], [place] [phone] [place] [address] [place], [place] [phone]    Dr. Adele Schilder  [phone]   [facility] [place] Dept. 1) [address] [place] 2) [place] 3)  [address], [place] [phone]  [phone]   [place] [phone])  [phone] or [phone] (After Hours)    ° ° ° °

## 2014-04-16 ENCOUNTER — Encounter (HOSPITAL_COMMUNITY): Payer: Self-pay | Admitting: Emergency Medicine

## 2014-04-16 ENCOUNTER — Emergency Department (INDEPENDENT_AMBULATORY_CARE_PROVIDER_SITE_OTHER)
Admission: EM | Admit: 2014-04-16 | Discharge: 2014-04-16 | Disposition: A | Discharge status: Home / Self Care | Payer: Self-pay | Source: Home / Self Care

## 2014-04-16 DIAGNOSIS — M549 Dorsalgia, unspecified: Secondary | ICD-10-CM

## 2014-04-16 DIAGNOSIS — M62838 Other muscle spasm: Secondary | ICD-10-CM

## 2014-04-16 HISTORY — DX: Alcoholic fatty liver: K70.0

## 2014-04-16 MED ORDER — TRAMADOL HCL 50 MG PO TABS: 50.0000 mg | ORAL_TABLET | Freq: Four times a day (QID) | ORAL | Status: DC | PRN

## 2014-04-16 MED ORDER — CYCLOBENZAPRINE HCL 5 MG PO TABS: 5.0000 mg | ORAL_TABLET | Freq: Three times a day (TID) | ORAL | Status: DC | PRN

## 2014-04-16 MED ORDER — KETOROLAC TROMETHAMINE 60 MG/2ML IM SOLN
60.0000 mg | Freq: Once | INTRAMUSCULAR | Status: AC
  Administered 2014-04-16: 60 mg via INTRAMUSCULAR

## 2014-04-16 MED ORDER — KETOROLAC TROMETHAMINE 60 MG/2ML IM SOLN
INTRAMUSCULAR | Status: AC
  Filled 2014-04-16: qty 2

## 2014-04-16 NOTE — Discharge Instructions (Signed)
Your back discomfort is all likely from muscle spasms Please start the flexeril to help release the muscles.  Starting tomorrow night (04/17/14) you can start taking ibuprofen 600mg  every 6 hours for further relief The toradol given you tonight in the clinic will last for 24 hours Please [place] ethe tramadol for your pain if the ibuprofen does not work Please come back i f you get worse.

## 2014-04-16 NOTE — ED Provider Notes (Signed)
CSN: [id number]     Arrival date & time 04/16/14  1730 History   None    Chief Complaint  Patient presents with  . Back Pain   (Consider location/radiation/quality/duration/timing/severity/associated sxs/prior Treatment) HPI   Back pain: onset 2 mo ago then resolved. Started again 3 wks ago. Off and on. Lower back and radiating up back slowly. OK when sitting. Worse w/ standing for prolonged period of time. Started working out about 2 mo ago due to concern for overall health deterioration. Primarily wt lifting w/o much aerobic. Wakes up at night. "general discomfort,"  And " tight". Deneis, any frequency, dysuria, abd pain, fever, syncope, CP, SOB, loss of bowel or bladder function. Has not taken any medications for this. Muscle relaxor from mom w/ benefit.    Past Medical History  Diagnosis Date  . Alcoholic fatty liver    History reviewed. No pertinent past surgical history. Family History  Problem Relation Age of Onset  . Diabetes Mother   . Cancer Father     liver  . Hypertension Father    History  Substance Use Topics  . Smoking status: Current Some Day Smoker -- 0.30 packs/day    Types: Cigarettes    Last Attempt to Quit: 10/24/2013  . Smokeless tobacco: Not on file  . Alcohol Use: No    Review of Systems  Constitutional: Positive for activity change. Negative for fever and chills.  Respiratory: Negative for shortness of breath.   Cardiovascular: Negative for chest pain.  Musculoskeletal: Positive for back pain.  All other systems reviewed and are negative.   Allergies  Review of patient's allergies indicates no known allergies.  Home Medications   Prior to Admission medications   Medication Sig Start Date End Date Taking? Authorizing Provider  cyclobenzaprine (FLEXERIL) 5 MG tablet Take 1 tablet (5 mg total) by mouth 3 (three) times daily as needed for muscle spasms. 04/16/14   Ozella Rocks, MD  hydrochlorothiazide (HYDRODIURIL) 25 MG tablet Take 1 tablet (25  mg total) by mouth daily. 01/08/14   Rodolph Bong, MD  ibuprofen (ADVIL,MOTRIN) 600 MG tablet Take 600 mg by mouth every 6 (six) hours as needed for moderate pain.    Historical Provider, MD  Multiple Vitamins-Minerals (CENTRUM ADULTS PO) Take 1 tablet by mouth daily.    Historical Provider, MD  omeprazole (PRILOSEC) 20 MG capsule Take 1 capsule (20 mg total) by mouth daily. 02/03/14   Marissa Sciacca, PA-C  ondansetron (ZOFRAN) 4 MG tablet Take 1 tablet (4 mg total) by mouth every 6 (six) hours. 02/03/14   Marissa Sciacca, PA-C  Probiotic Product (ALIGN) 4 MG CAPS Take 4 mg by mouth daily.    Historical Provider, MD  traMADol (ULTRAM) 50 MG tablet Take 1 tablet (50 mg total) by mouth every 6 (six) hours as needed for moderate pain. 04/16/14   Ozella Rocks, MD   BP 152/86  Pulse 69  Temp(Src) 98.2 F (36.8 C) (Oral)  Resp 12  SpO2 98% Physical Exam  Constitutional: He is oriented to person, place, and time. He appears well-developed and well-nourished. No distress.  HENT:  Head: Normocephalic and atraumatic.  Eyes: EOM are normal. Pupils are equal, round, and reactive to light.  Neck: Normal range of motion.  Cardiovascular: Normal rate, normal heart sounds and intact distal pulses.  Exam reveals no gallop.   No murmur heard. Pulmonary/Chest: Effort normal and breath sounds normal.  Abdominal: Soft. He exhibits no distension.  Musculoskeletal: Normal range of motion.  Perispinal muscle tightness and ttp along the lumbar and thoracic regions.   Neurological: He is alert and oriented to person, place, and time. No cranial nerve deficit. He exhibits normal muscle tone. Coordination normal.  Skin: Skin is warm. No rash noted. He is not diaphoretic.  Psychiatric: He has a normal mood and affect. His behavior is normal. Judgment and thought content normal.    ED Course  Procedures (including critical care time) Labs Review Labs Reviewed - No data to display  Imaging Review No results  found.   MDM   1. Back pain   2. Muscle spasm    Back pain likely from spasm and overuse due to recent increase in wt lifting.  - toradol 60 in office - flexeril and tramadol given for home use prn - stretching, massage, heat packs recommended - precuations given and all questions answered  Shelly Flattenavid Merrell, MD Family Medicine PGY-3 04/16/2014, 7:02 PM      Ozella Rocksavid J Merrell, MD 04/16/14 1902

## 2014-04-16 NOTE — ED Provider Notes (Signed)
Medical screening examination/treatment/procedure(s) were performed by a resident physician and as supervising physician I was immediately available for consultation/collaboration.  Leslee Home, M.D.  Reuben Likes, MD 04/16/14 2216

## 2014-04-16 NOTE — ED Notes (Signed)
Patient states is having left side back pain with some Tingling sensation to both legs that started about three weeks ago Denies any injury to his back .  Patient stated his back feels more like pressure

## 2014-04-21 ENCOUNTER — Encounter (HOSPITAL_COMMUNITY): Payer: Self-pay | Admitting: Emergency Medicine

## 2014-04-21 ENCOUNTER — Emergency Department (HOSPITAL_COMMUNITY)
Admission: EM | Admit: 2014-04-21 | Discharge: 2014-04-21 | Disposition: A | Discharge status: Home / Self Care | Payer: Self-pay | Attending: Emergency Medicine | Admitting: Emergency Medicine

## 2014-04-21 ENCOUNTER — Emergency Department (HOSPITAL_COMMUNITY): Payer: Self-pay

## 2014-04-21 DIAGNOSIS — M62830 Muscle spasm of back: Secondary | ICD-10-CM

## 2014-04-21 DIAGNOSIS — K700 Alcoholic fatty liver: Secondary | ICD-10-CM | POA: Insufficient documentation

## 2014-04-21 DIAGNOSIS — M62838 Other muscle spasm: Secondary | ICD-10-CM | POA: Insufficient documentation

## 2014-04-21 DIAGNOSIS — R209 Unspecified disturbances of skin sensation: Secondary | ICD-10-CM | POA: Insufficient documentation

## 2014-04-21 DIAGNOSIS — F17200 Nicotine dependence, unspecified, uncomplicated: Secondary | ICD-10-CM | POA: Insufficient documentation

## 2014-04-21 MED ORDER — PREDNISONE 10 MG PO TABS: 20.0000 mg | ORAL_TABLET | Freq: Every day | ORAL | Status: DC

## 2014-04-21 MED ORDER — OXYCODONE HCL 5 MG PO TABS: 5.0000 mg | ORAL_TABLET | ORAL | Status: DC | PRN

## 2014-04-21 NOTE — Discharge Instructions (Signed)
Back Pain, Adult Low back pain is very common. About 1 in 5 people have back pain.The cause of low back pain is rarely dangerous. The pain often gets better over time.About half of people with a sudden onset of back pain feel better in just 2 weeks. About 8 in 10 people feel better by 6 weeks.  CAUSES Some common causes of back pain include:  Strain of the muscles or ligaments supporting the spine.  Wear and tear (degeneration) of the spinal discs.  Arthritis.  Direct injury to the back. DIAGNOSIS Most of the time, the direct cause of low back pain is not known.However, back pain can be treated effectively even when the exact cause of the pain is unknown.Answering your caregiver's questions about your overall health and symptoms is one of the most accurate ways to make sure the cause of your pain is not dangerous. If your caregiver needs more information, he or she may order lab work or imaging tests (X-rays or MRIs).However, even if imaging tests show changes in your back, this usually does not require surgery. HOME CARE INSTRUCTIONS For many people, back pain returns.Since low back pain is rarely dangerous, it is often a condition that people can learn to [facility] their own.   Remain active. It is stressful on the back to sit or stand in one place. Do not sit, drive, or stand in one place for more than 30 minutes at a time. Take short walks on level surfaces as soon as pain allows.Try to increase the length of time you walk each day.  Do not stay in bed.Resting more than 1 or 2 days can delay your recovery.  Do not avoid exercise or work.Your body is made to move.It is not dangerous to be active, even though your back may hurt.Your back will likely heal faster if you return to being active before your pain is gone.  Pay attention to your body when you bend and lift. Many people have less discomfortwhen lifting if they bend their knees, keep the load close to their bodies,and  avoid twisting. Often, the most comfortable positions are those that put less stress on your recovering back.  Find a comfortable position to sleep. Use a firm mattress and lie on your side with your knees slightly bent. If you lie on your back, put a pillow under your knees.  Only take over-the-counter or prescription medicines as directed by your caregiver. Over-the-counter medicines to reduce pain and inflammation are often the most helpful.Your caregiver may prescribe muscle relaxant drugs.These medicines help dull your pain so you can more quickly return to your normal activities and healthy exercise.  Put ice on the injured area.  Put ice in a plastic bag.  Place a towel between your skin and the bag.  Leave the ice on for 15-20 minutes, 03-04 times a day for the first 2 to 3 days. After that, ice and heat may be alternated to reduce pain and spasms.  Ask your caregiver about trying back exercises and gentle massage. This may be of some benefit.  Avoid feeling anxious or stressed.Stress increases muscle tension and can worsen back pain.It is important to recognize when you are anxious or stressed and learn ways to manage it.Exercise is a great option. SEEK MEDICAL CARE IF:  You have pain that is not relieved with rest or medicine.  You have pain that does not improve in 1 week.  You have new symptoms.  You are generally not feeling well. SEEK  IMMEDIATE MEDICAL CARE IF:  °· You have pain that radiates from your back into your legs. °· You develop new bowel or bladder control problems. °· You have unusual weakness or numbness in your arms or legs. °· You develop nausea or vomiting. °· You develop abdominal pain. °· You feel faint. °Document Released: 11/01/2005 Document Revised: 05/02/2012 Document Reviewed: 03/22/2011 °ExitCare® Patient Information ©2014 [employer]. ° °Muscle Cramps and Spasms °Muscle cramps and spasms occur when a muscle or muscles tighten and you have no  control over this tightening (involuntary muscle contraction). They are a common problem and can develop in any muscle. The most common place is in the calf muscles of the leg. Both muscle cramps and muscle spasms are involuntary muscle contractions, but they also have differences:  °· Muscle cramps are sporadic and painful. They may last a few seconds to a quarter of an hour. Muscle cramps are often more forceful and last longer than muscle spasms. °· Muscle spasms may or may not be painful. They may also last just a few seconds or much longer. °CAUSES  °It is uncommon for cramps or spasms to be due to a serious underlying problem. In many cases, the cause of cramps or spasms is unknown. Some common causes are:  °· Overexertion.   °· Overuse from repetitive motions (doing the same thing over and over).   °· Remaining in a certain position for a long period of time.   °· Improper preparation, form, or technique while performing a sport or activity.   °· Dehydration.   °· Injury.   °· Side effects of some medicines.   °· Abnormally low levels of the salts and ions in your blood (electrolytes), especially potassium and calcium. This could happen if you are taking water pills (diuretics) or you are pregnant.   °Some underlying medical problems can make it more likely to develop cramps or spasms. These include, but are not limited to:  °· Diabetes.   °· Parkinson disease.   °· Hormone disorders, such as thyroid problems.   °· Alcohol abuse.   °· Diseases specific to muscles, joints, and bones.   °· Blood vessel disease where not enough blood is getting to the muscles.   °HOME CARE INSTRUCTIONS  °· Stay well hydrated. Drink enough water and fluids to keep your urine clear or pale yellow. °· It may be helpful to massage, stretch, and relax the affected muscle. °· For tight or tense muscles, use a warm towel, heating pad, or hot shower water directed to the affected area. °· If you are sore or have pain after a cramp or  spasm, applying ice to the affected area may relieve discomfort. °· Put ice in a plastic bag. °· Place a towel between your skin and the bag. °· Leave the ice on for 15-20 minutes, 03-04 times a day. °· Medicines used to treat a known cause of cramps or spasms may help reduce their frequency or severity. Only take over-the-counter or prescription medicines as directed by your caregiver. °SEEK MEDICAL CARE IF:  °Your cramps or spasms get more severe, more frequent, or do not improve over time.  °MAKE SURE YOU:  °· Understand these instructions. °· Will watch your condition. °· Will get help right away if you are not doing well or get worse. °Document Released: 04/23/2002 Document Revised: 02/26/2013 Document Reviewed: 10/18/2012 °ExitCare® Patient Information ©2014 [employer]. ° °

## 2014-04-21 NOTE — ED Provider Notes (Signed)
CSN: [id number]     Arrival date & time 04/21/14  1415 History  This chart was scribed for non-physician practitioner Marlon Peliffany Zahria Ding, PA-C working with Purvis SheffieldHarrison, Forrest MD by Valera CastleSteven Perry, ED scribe. This patient was seen in room WTR7/WTR7 and the patient's care was started at 2:47 PM   Chief Complaint  Patient presents with  . Back Pain   (Consider location/radiation/quality/duration/timing/severity/associated sxs/prior Treatment) The history is provided by the patient. No language interpreter was used.   HPI Comments: Douglas Wong is a 33 y.o. male with h/o DM, who presents to the Emergency Department complaining of gradually worsening, intermittent, left, middle back pain, with associated intermittent tingling in his bilateral LE. He states his episodes of tingling in his LE will last a few minutes. He states that standing up for prolonged periods of time brings on his back pain and he describes the pain as tightening muscles. He also reports intermittent hand tingling. He states he is right dominant. [facility]. No xrays were done. He states that he was given a muscle relaxant and pain medication from the [facility] that he has been taking, without relief. He states he works outside a lot, physical work. He reports similar back pain in his right lower back a few years ago, but states that has completely gone away. He states he checks his sugar levels regularly, states they are normal. He also reports a small area of fungus over his bilateral feet. He reports he has been using fungal cream for about 1 week. He denies bowel and bladder incontinence, LE weakness, IV drug use, fever, rash, and any other associated symptoms. He reports he was told he cannot take Tylenol for h/o alcoholic fatty liver.   PCP - No PCP Per Patient  Past Medical History  Diagnosis Date  . Alcoholic fatty liver    History reviewed. No pertinent past surgical history. Family  History  Problem Relation Age of Onset  . Diabetes Mother   . Cancer Father     liver  . Hypertension Father    History  Substance Use Topics  . Smoking status: Current Some Day Smoker -- 0.30 packs/day    Types: Cigarettes    Last Attempt to Quit: 10/24/2013  . Smokeless tobacco: Not on file  . Alcohol Use: No    Review of Systems  Constitutional: Negative for fever.  Gastrointestinal: Negative for abdominal pain.  Genitourinary:       No bladder incontinence.   Musculoskeletal: Positive for back pain (mid left) and myalgias. Negative for arthralgias.  Skin: Negative for rash.  Neurological: Positive for numbness (tingling in bilateral LE). Negative for weakness.    Allergies  Review of patient's allergies indicates no known allergies.  Home Medications   Prior to Admission medications   Medication Sig Start Date End Date Taking? Authorizing Provider  cyclobenzaprine (FLEXERIL) 5 MG tablet Take 1 tablet (5 mg total) by mouth 3 (three) times daily as needed for muscle spasms. 04/16/14   Ozella Rocksavid J Merrell, MD  hydrochlorothiazide (HYDRODIURIL) 25 MG tablet Take 1 tablet (25 mg total) by mouth daily. 01/08/14   Rodolph BongEvan S Corey, MD  ibuprofen (ADVIL,MOTRIN) 600 MG tablet Take 600 mg by mouth every 6 (six) hours as needed for moderate pain.    Historical Provider, MD  Multiple Vitamins-Minerals (CENTRUM ADULTS PO) Take 1 tablet by mouth daily.    Historical Provider, MD  omeprazole (PRILOSEC) 20  MG capsule Take 1 capsule (20 mg total) by mouth daily. 02/03/14   Marissa Sciacca, PA-C  ondansetron (ZOFRAN) 4 MG tablet Take 1 tablet (4 mg total) by mouth every 6 (six) hours. 02/03/14   Marissa Sciacca, PA-C  oxyCODONE (OXY IR/ROXICODONE) 5 MG immediate release tablet Take 1 tablet (5 mg total) by mouth every 4 (four) hours as needed for severe pain. 04/21/14   Kassidi Elza Irine Seal, PA-C  predniSONE (DELTASONE) 10 MG tablet Take 2 tablets (20 mg total) by mouth daily. 04/21/14   Tee Richeson Irine Seal, PA-C   Probiotic Product (ALIGN) 4 MG CAPS Take 4 mg by mouth daily.    Historical Provider, MD  traMADol (ULTRAM) 50 MG tablet Take 1 tablet (50 mg total) by mouth every 6 (six) hours as needed for moderate pain. 04/16/14   Ozella Rocks, MD   BP 157/88  Pulse 96  Temp(Src) 98.2 F (36.8 C) (Oral)  Resp 20  SpO2 100% Physical Exam  Nursing note and vitals reviewed. Constitutional: He is oriented to person, place, and time. He appears well-developed and well-nourished. No distress.  HENT:  Head: Normocephalic and atraumatic.  Eyes: Conjunctivae and EOM are normal.  Neck: Normal range of motion. No tracheal deviation present.  Cardiovascular: Normal rate.   Pulmonary/Chest: Effort normal. No respiratory distress.  Musculoskeletal: Normal range of motion.  Pt has equal strength to bilateral lower extremities.  Neurosensory function adequate to both legs No clonus on dorsiflextion Skin color is normal. Skin is warm and moist.  I see no step off deformity, no midline bony tenderness.  Pt is able to ambulate.  No crepitus, laceration, effusion, induration, lesions, swelling.   Pedal pulses are symmetrical and palpable bilaterally  Mild lumbar tenderness to palpation of paraspinel muscles   Neurological: He is alert and oriented to person, place, and time.  Skin: Skin is warm and dry.  Psychiatric: He has a normal mood and affect. His behavior is normal.    ED Course  Procedures (including critical care time)  DIAGNOSTIC STUDIES: Oxygen Saturation is 100% on room air, normal by my interpretation.    COORDINATION OF CARE: 2:56 PM-Discussed treatment plan with pt at bedside and pt agreed to plan.   Medications - No data to display  Imaging Review Dg Lumbar Spine Complete  04/21/2014   CLINICAL DATA:  Low back pain on and off for 2 months. Worse left-sided. No injury.  EXAM: LUMBAR SPINE - COMPLETE 4+ VIEW  COMPARISON:  None.  FINDINGS: There is no evidence of lumbar spine fracture.  Alignment is normal. Intervertebral disc spaces are maintained.  IMPRESSION: Negative.   Electronically Signed   By: Elberta Fortis M.D.   On: 04/21/2014 14:39     EKG Interpretation None     MDM   Final diagnoses:  Back spasm   32 y.o.Douglas Wong  with back pain. No neurological deficits and normal neuro exam. Patient can walk but states is painful. No loss of bowel or bladder control. No concern for cauda equina. No fever, night sweats, weight loss, h/o cancer, IVDU. RICE protocol and pain medicine indicated and discussed with patient.   Patient Plan 1. Medications: pain medication, prednisone dose pack. 2. Treatment: rest, drink plenty of fluids, gentle stretching as discussed, alternate ice and heat  3. Follow Up: Please followup with your primary doctor for discussion of your diagnoses and further evaluation after today's visit; if you do not have a primary care doctor use the resource guide  provided to find one   Vital signs are stable at discharge. Filed Vitals:   04/21/14 1421  BP: 157/88  Pulse: 96  Temp: 98.2 F (36.8 C)  Resp: 20    Patient/guardian has voiced understanding and agreed to follow-up with the PCP or specialist.    I personally performed the services described in this documentation, which was scribed in my presence. The recorded information has been reviewed and is accurate.    Dorthula Matas, PA-C 04/24/14 1233

## 2014-04-21 NOTE — ED Notes (Addendum)
Pt was seen at urgent care on the 2nd for back pain. Pt here with same complaints of upper left side back pain and bilateral leg numbness at times. Pt states he is more concerned about the numbness in legs.

## 2014-04-24 NOTE — ED Provider Notes (Signed)
Medical screening examination/treatment/procedure(s) were performed by non-physician practitioner and as supervising physician I was immediately available for consultation/collaboration.   EKG Interpretation None        Tabby Beaston S Matthew Cina, MD 04/24/14 1635 

## 2014-05-02 ENCOUNTER — Encounter (HOSPITAL_COMMUNITY): Payer: Self-pay | Admitting: Emergency Medicine

## 2014-05-02 ENCOUNTER — Emergency Department (INDEPENDENT_AMBULATORY_CARE_PROVIDER_SITE_OTHER)
Admission: EM | Admit: 2014-05-02 | Discharge: 2014-05-02 | Disposition: A | Discharge status: Home / Self Care | Payer: Self-pay | Source: Home / Self Care

## 2014-05-02 DIAGNOSIS — R0982 Postnasal drip: Secondary | ICD-10-CM

## 2014-05-02 DIAGNOSIS — J309 Allergic rhinitis, unspecified: Secondary | ICD-10-CM

## 2014-05-02 NOTE — Discharge Instructions (Signed)
Allergic Rhinitis Allegra or claritin or zyrtec for drainage Flonase nasal spray Drink lots of water Allergic rhinitis is when the mucous membranes in the nose respond to allergens. Allergens are particles in the air that cause your body to have an allergic reaction. This causes you to release allergic antibodies. Through a chain of events, these eventually cause you to release histamine into the blood stream. Although meant to protect the body, it is this release of histamine that causes your discomfort, such as frequent sneezing, congestion, and an itchy, runny nose.  CAUSES  Seasonal allergic rhinitis (hay fever) is caused by pollen allergens that may come from grasses, trees, and weeds. Year-round allergic rhinitis (perennial allergic rhinitis) is caused by allergens such as house dust mites, pet dander, and mold spores.  SYMPTOMS   Nasal stuffiness (congestion).  Itchy, runny nose with sneezing and tearing of the eyes. DIAGNOSIS  Your health care provider can help you determine the allergen or allergens that trigger your symptoms. If you and your health care provider are unable to determine the allergen, skin or blood testing may be used. TREATMENT  Allergic rhinitis does not have a cure, but it can be controlled by:  Medicines and allergy shots (immunotherapy).  Avoiding the allergen. Hay fever may often be treated with antihistamines in pill or nasal spray forms. Antihistamines block the effects of histamine. There are over-the-counter medicines that may help with nasal congestion and swelling around the eyes. Check with your health care provider before taking or giving this medicine.  If avoiding the allergen or the medicine prescribed do not work, there are many new medicines your health care provider can prescribe. Stronger medicine may be used if initial measures are ineffective. Desensitizing injections can be used if medicine and avoidance does not work. Desensitization is when a  patient is given ongoing shots until the body becomes less sensitive to the allergen. Make sure you follow up with your health care provider if problems continue. HOME CARE INSTRUCTIONS It is not possible to completely avoid allergens, but you can reduce your symptoms by taking steps to limit your exposure to them. It helps to know exactly what you are allergic to so that you can avoid your specific triggers. SEEK MEDICAL CARE IF:   You have a fever.  You develop a cough that does not stop easily (persistent).  You have shortness of breath.  You start wheezing.  Symptoms interfere with normal daily activities. Document Released: 07/27/2001 Document Revised: 11/06/2013 Document Reviewed: 07/09/2013 [facility] Patient Information 2015 [place], [place]. This information is not intended to replace advice given to you by your health care provider. Make sure you discuss any questions you have with your health care provider.

## 2014-05-02 NOTE — ED Provider Notes (Signed)
CSN: [id number]     Arrival date & time 05/02/14  0857 History   First MD Initiated Contact with Patient 05/02/14 1018     Chief Complaint  Patient presents with  . Sore Throat   (Consider location/radiation/quality/duration/timing/severity/associated sxs/prior Treatment) HPI Comments: 33 year old male is concerned about "bumps" on the back of his throat associated with dry mouth and  trouble swallowing. He is able to eat and drink normally. The bumps were first noticed about 2 months ago. It is worse in the morning when he has a collection of mucus in his throat. It goes away during the day.  Patient is a 33 y.o. male presenting with pharyngitis.  Sore Throat    Past Medical History  Diagnosis Date  . Alcoholic fatty liver    History reviewed. No pertinent past surgical history. Family History  Problem Relation Age of Onset  . Diabetes Mother   . Cancer Father     liver  . Hypertension Father    History  Substance Use Topics  . Smoking status: Current Some Day Smoker -- 0.30 packs/day    Types: Cigarettes    Last Attempt to Quit: 10/24/2013  . Smokeless tobacco: Not on file  . Alcohol Use: No    Review of Systems  Constitutional: Negative.   HENT: Positive for congestion and postnasal drip. Negative for sore throat.   Eyes: Negative.   Respiratory: Negative.   Musculoskeletal: Negative.     Allergies  Review of patient's allergies indicates no known allergies.  Home Medications   Prior to Admission medications   Medication Sig Start Date End Date Taking? Authorizing Provider  cyclobenzaprine (FLEXERIL) 5 MG tablet Take 1 tablet (5 mg total) by mouth 3 (three) times daily as needed for muscle spasms. 04/16/14   Ozella Rocksavid J Merrell, MD  hydrochlorothiazide (HYDRODIURIL) 25 MG tablet Take 1 tablet (25 mg total) by mouth daily. 01/08/14   Rodolph BongEvan S Corey, MD  ibuprofen (ADVIL,MOTRIN) 600 MG tablet Take 600 mg by mouth every 6 (six) hours as needed for moderate pain.     Historical Provider, MD  Multiple Vitamins-Minerals (CENTRUM ADULTS PO) Take 1 tablet by mouth daily.    Historical Provider, MD  omeprazole (PRILOSEC) 20 MG capsule Take 1 capsule (20 mg total) by mouth daily. 02/03/14   Marissa Sciacca, PA-C  ondansetron (ZOFRAN) 4 MG tablet Take 1 tablet (4 mg total) by mouth every 6 (six) hours. 02/03/14   Marissa Sciacca, PA-C  oxyCODONE (OXY IR/ROXICODONE) 5 MG immediate release tablet Take 1 tablet (5 mg total) by mouth every 4 (four) hours as needed for severe pain. 04/21/14   Tiffany Irine SealG Greene, PA-C  predniSONE (DELTASONE) 10 MG tablet Take 2 tablets (20 mg total) by mouth daily. 04/21/14   Tiffany Irine SealG Greene, PA-C  Probiotic Product (ALIGN) 4 MG CAPS Take 4 mg by mouth daily.    Historical Provider, MD  traMADol (ULTRAM) 50 MG tablet Take 1 tablet (50 mg total) by mouth every 6 (six) hours as needed for moderate pain. 04/16/14   Ozella Rocksavid J Merrell, MD   BP 146/98  Pulse 84  Temp(Src) 98.4 F (36.9 C) (Oral)  Resp 12  SpO2 99% Physical Exam  Nursing note and vitals reviewed. Constitutional: He is oriented to person, place, and time. He appears well-developed and well-nourished. No distress.  HENT:  Mouth/Throat: No oropharyngeal exudate.  Oropharynx with minor erythema and cobblestoning. There are no papules, vesicles or exudates. The bumps in which the patient is referring is  the cobblestoning appearance.  Eyes: Conjunctivae are normal.  Neck: Normal range of motion. Neck supple.  Cardiovascular: Normal rate and regular rhythm.   Pulmonary/Chest: Effort normal. No respiratory distress.  Lymphadenopathy:    He has no cervical adenopathy.  Neurological: He is alert and oriented to person, place, and time.  Skin: Skin is warm and dry.  Psychiatric: He has a normal mood and affect.    ED Course  Procedures (including critical care time) Labs Review Labs Reviewed - No data to display  Imaging Review No results found.   MDM   1. PND (post-nasal  drip)   2. Allergic rhinitis      Allegra or claritin or zyrtec for drainage Flonase nasal spray Drink lots of water    Hayden Rasmussenavid Mabe, NP 05/02/14 1030

## 2014-05-02 NOTE — ED Notes (Signed)
Patient reports tongue and throat soreness, not pain.  Still c/o feeling it is hard to swallow at times.  Intermittent in occurrence.

## 2014-05-03 NOTE — ED Provider Notes (Signed)
Medical screening examination/treatment/procedure(s) were performed by a resident physician or non-physician practitioner and as the supervising physician I was immediately available for consultation/collaboration.  Cataleya Cristina, MD    Aylyn Wenzler S Song Garris, MD 05/03/14 0738 

## 2014-05-13 ENCOUNTER — Encounter (HOSPITAL_COMMUNITY): Payer: Self-pay | Admitting: Emergency Medicine

## 2014-05-13 ENCOUNTER — Emergency Department (HOSPITAL_COMMUNITY): Payer: Self-pay

## 2014-05-13 ENCOUNTER — Emergency Department (HOSPITAL_COMMUNITY)
Admission: EM | Admit: 2014-05-13 | Discharge: 2014-05-13 | Disposition: A | Discharge status: Home / Self Care | Payer: Self-pay | Attending: Emergency Medicine | Admitting: Emergency Medicine

## 2014-05-13 DIAGNOSIS — M549 Dorsalgia, unspecified: Secondary | ICD-10-CM | POA: Insufficient documentation

## 2014-05-13 DIAGNOSIS — G8929 Other chronic pain: Secondary | ICD-10-CM | POA: Insufficient documentation

## 2014-05-13 DIAGNOSIS — F17200 Nicotine dependence, unspecified, uncomplicated: Secondary | ICD-10-CM | POA: Insufficient documentation

## 2014-05-13 DIAGNOSIS — Z79899 Other long term (current) drug therapy: Secondary | ICD-10-CM | POA: Insufficient documentation

## 2014-05-13 DIAGNOSIS — K5900 Constipation, unspecified: Secondary | ICD-10-CM | POA: Insufficient documentation

## 2014-05-13 MED ORDER — PEG 3350-KCL-NABCB-NACL-NASULF 236 G PO SOLR: 4000.0000 mL | Freq: Once | ORAL | Status: DC

## 2014-05-13 MED ORDER — BISACODYL 5 MG PO TBEC: 5.0000 mg | DELAYED_RELEASE_TABLET | Freq: Every day | ORAL | Status: DC

## 2014-05-13 MED ORDER — FLEET ENEMA 7-19 GM/118ML RE ENEM: 1.0000 | ENEMA | Freq: Once | RECTAL | Status: DC

## 2014-05-13 NOTE — ED Provider Notes (Signed)
CSN: [id number]     Arrival date & time 05/13/14  0759 History   First MD Initiated Contact with Patient 05/13/14 0805     Chief Complaint  Patient presents with  . Abdominal Pain  . Back Pain     (Consider location/radiation/quality/duration/timing/severity/associated sxs/prior Treatment) HPI  Douglas Wong is a 33 y.o. male with h/o chronic back pain and fatty liver presents to the Emergency Department complaining of constipation for 3 days. He reports that he has been passing flatulence but less than normal. He takes Ultram at home for pain, says he has been drinking plenty of water and exercising. He has a history of abdominal pains and see's a gastroenterologist for this. His last visit was 3 months ago and he has another appointment in approximately 1 month. He says that he has the sensation like he needs to go but nothing is coming out, when he does pass a small amount of stool it is either pellets or very "thin". No diarrhea and no bleeding. His abd pain is very mild, cramping and diffuse. He has no hx of abdominal surgery, has not had any vomiting or diarrhea. He also would like his chronic back pain to be evaluated, not worse than normal and no new symptoms.   PCP - No PCP Per Patient   Past Medical History  Diagnosis Date  . Alcoholic fatty liver    History reviewed. No pertinent past surgical history. Family History  Problem Relation Age of Onset  . Diabetes Mother   . Cancer Father     liver  . Hypertension Father    History  Substance Use Topics  . Smoking status: Current Some Day Smoker -- 0.30 packs/day    Types: Cigarettes    Last Attempt to Quit: 10/24/2013  . Smokeless tobacco: Not on file  . Alcohol Use: No    Review of Systems   Review of Systems  Gen: no weight loss, fevers, chills, night sweats  Eyes: no discharge or drainage, no occular pain or visual changes  Nose: no epistaxis or rhinorrhea  Mouth: no dental pain, no sore throat  Neck: no neck  pain  Lungs:No wheezing, coughing or hemoptysis CV: no chest pain, palpitations, dependent edema or orthopnea  Abd: no abdominal pain, nausea, vomiting, diarrhea, + constipation and abd cramping  GU: no dysuria or gross hematuria  MSK:  No muscle weakness, + chronic back pain Neuro: no headache, no focal neurologic deficits  Skin: no rash or wounds Psyche: no complaints    Allergies  Review of patient's allergies indicates no known allergies.  Home Medications   Prior to Admission medications   Medication Sig Start Date End Date Taking? Authorizing Provider  cyclobenzaprine (FLEXERIL) 5 MG tablet Take 1 tablet (5 mg total) by mouth 3 (three) times daily as needed for muscle spasms. 04/16/14  Yes Ozella Rocksavid J Merrell, MD  Multiple Vitamins-Minerals (CENTRUM ADULTS PO) Take 1 tablet by mouth daily.   Yes Historical Provider, MD  omeprazole (PRILOSEC) 20 MG capsule Take 1 capsule (20 mg total) by mouth daily. 02/03/14  Yes Marissa Sciacca, PA-C  Probiotic Product (ALIGN) 4 MG CAPS Take 4 mg by mouth daily.   Yes Historical Provider, MD  traMADol (ULTRAM) 50 MG tablet Take 1 tablet (50 mg total) by mouth every 6 (six) hours as needed for moderate pain. 04/16/14  Yes Ozella Rocksavid J Merrell, MD  bisacodyl (DULCOLAX) 5 MG EC tablet Take 1 tablet (5 mg total) by mouth daily. 05/13/14  Dorthula Matasiffany G Greene, PA-C  polyethylene glycol (GOLYTELY) 236 G solution Take 4,000 mLs by mouth once. 05/13/14   Tiffany Irine SealG Greene, PA-C  sodium phosphate (FLEET) 7-19 GM/118ML ENEM Place 133 mLs (1 enema total) rectally once. 05/13/14   Tiffany Irine SealG Greene, PA-C   BP 156/87  Pulse 90  Temp(Src) 98.1 F (36.7 C) (Oral)  Resp 18  Ht 5\' 11"  (1.803 m)  Wt 207 lb (93.895 kg)  BMI 28.88 kg/m2  SpO2 100% Physical Exam  Nursing note and vitals reviewed. Constitutional: He appears well-developed and well-nourished. No distress.  HENT:  Head: Normocephalic and atraumatic.  Eyes: Pupils are equal, round, and reactive to light.  Neck:  Normal range of motion. Neck supple.  Cardiovascular: Normal rate and regular rhythm.   Pulmonary/Chest: Effort normal.  Abdominal: Soft. Bowel sounds are normal. He exhibits no distension, no fluid wave and no ascites. There is no tenderness. There is no rebound, no guarding and no CVA tenderness.  pts abdomen is soft and non tender on exam.  Musculoskeletal:  Pt has equal strength to bilateral lower extremities.  Neurosensory function adequate to both legs No clonus on dorsiflextion Skin color is normal. Skin is warm and moist.  I see no step off deformity, no midline bony tenderness.  Pt is able to ambulate.  No crepitus, laceration, effusion, induration, lesions, swelling.   Pedal pulses are symmetrical and palpable bilaterally     Neurological: He is alert.  Skin: Skin is warm and dry.    ED Course  Procedures (including critical care time) Labs Review Labs Reviewed - No data to display  Imaging Review Dg Abd 2 Views  05/13/2014   CLINICAL DATA:  Constipation.  EXAM: ABDOMEN - 2 VIEW  COMPARISON:  CT abdomen and pelvis 02/03/2014.  FINDINGS: There is a large volume of stool throughout the colon. No free intraperitoneal air or evidence of bowel obstruction is identified. No abnormal abdominal calcification or focal bony abnormality is seen.  IMPRESSION: No acute finding.  Large volume of stool throughout the colon.   Electronically Signed   By: Drusilla Kannerhomas  Dalessio M.D.   On: 05/13/2014 09:23     EKG Interpretation None      MDM   Final diagnoses:  Constipation, unspecified constipation type    Young patient complaining of constipation and abd cramping. No fevers, nausea, vomiting or diarrhea. Will get abd 2 view, I do not feel that blood work is needed at this time.  9:34 am Abdominal xray confirms large amount of stool throughout colon. Discussed water intake, high fiber diet, green diet, working out and how narcotic medications can cause constipation.  bisacodyl  (DULCOLAX) 5 MG EC tablet Take 1 tablet (5 mg total) by mouth daily. 14 tablet Dorthula Matasiffany G Greene, PA-C   polyethylene glycol (GOLYTELY) 236 G solution Take 4,000 mLs by mouth once. 4,000 mL Dorthula Matasiffany G Greene, PA-C   sodium phosphate (FLEET) 7-19 GM/118ML ENEM Place 133 mLs (1 enema total) rectally once. 2 enema Dorthula Matasiffany G Greene, PA-C  Take 1 enema and if that does not give relief use the golytely. The second enema is to be used at a later date if needed. Please take stool softener once a day for two weeks until re regulated.  32 y.o.Grace BushyAlex T Cadet's evaluation in the Emergency Department is complete. It has been determined that no acute conditions requiring further emergency intervention are present at this time. The patient/guardian have been advised of the diagnosis and plan. We have discussed  signs and symptoms that warrant return to the ED, such as changes or worsening in symptoms.  Vital signs are stable at discharge. Filed Vitals:   05/13/14 0823  BP: 156/87  Pulse: 90  Temp: 98.1 F (36.7 C)  Resp: 18    Patient/guardian has voiced understanding and agreed to follow-up with the PCP or specialist.   Dorthula Matas, PA-C 05/13/14 [phone]

## 2014-05-13 NOTE — ED Notes (Addendum)
Pt sates abdominal pain x 3 days.  Pt states he normally goes each day.  States stomach is rumbling.  Also wants his chronic back pain checked.  Pt states he has had no fever or vomiting.  No difficulty urinating.  No new injury to back.

## 2014-05-13 NOTE — Discharge Instructions (Signed)
Constipation Constipation is when a person has fewer than three bowel movements a week, has difficulty having a bowel movement, or has stools that are dry, hard, or larger than normal. As people grow older, constipation is more common. If you try to fix constipation with medicines that make you have a bowel movement (laxatives), the problem may get worse. Long-term laxative use may cause the muscles of the colon to become weak. A low-fiber diet, not taking in enough fluids, and taking certain medicines may make constipation worse.  CAUSES   Certain medicines, such as antidepressants, pain medicine, iron supplements, antacids, and water pills.   Certain diseases, such as diabetes, irritable bowel syndrome (IBS), thyroid disease, or depression.   Not drinking enough water.   Not eating enough fiber-rich foods.   Stress or travel.   Lack of physical activity or exercise.   Ignoring the urge to have a bowel movement.   Using laxatives too much.  SIGNS AND SYMPTOMS   Having fewer than three bowel movements a week.   Straining to have a bowel movement.   Having stools that are hard, dry, or larger than normal.   Feeling full or bloated.   Pain in the lower abdomen.   Not feeling relief after having a bowel movement.  DIAGNOSIS  Your health care provider will take a medical history and perform a physical exam. Further testing may be done for severe constipation. Some tests may include:  A barium enema X-ray to examine your rectum, colon, and, sometimes, your small intestine.   A sigmoidoscopy to examine your lower colon.   A colonoscopy to examine your entire colon. TREATMENT  Treatment will depend on the severity of your constipation and what is causing it. Some dietary treatments include drinking more fluids and eating more fiber-rich foods. Lifestyle treatments may include regular exercise. If these diet and lifestyle recommendations do not help, your health care  provider may recommend taking over-the-counter laxative medicines to help you have bowel movements. Prescription medicines may be prescribed if over-the-counter medicines do not work.  HOME CARE INSTRUCTIONS   Eat foods that have a lot of fiber, such as fruits, vegetables, whole grains, and beans.  Limit foods high in fat and processed sugars, such as french fries, hamburgers, cookies, candies, and soda.   A fiber supplement may be added to your diet if you cannot get enough fiber from foods.   Drink enough fluids to keep your urine clear or pale yellow.   Exercise regularly or as directed by your health care provider.   Go to the restroom when you have the urge to go. Do not hold it.   Only take over-the-counter or prescription medicines as directed by your health care provider. Do not take other medicines for constipation without talking to your health care provider first.  SEEK IMMEDIATE MEDICAL CARE IF:   You have bright red blood in your stool.   Your constipation lasts for more than 4 days or gets worse.   You have abdominal or rectal pain.   You have thin, pencil-like stools.   You have unexplained weight loss. MAKE SURE YOU:   Understand these instructions.  Will watch your condition.  Will get help right away if you are not doing well or get worse. Document Released: 07/30/2004 Document Revised: 11/06/2013 Document Reviewed: 08/13/2013 ExitCare Patient Information 2015 [employer]. This information is not intended to replace advice given to you by your health care provider. Make sure you discuss any questions   you have with your health care provider.  Fiber Content in Foods Drinking plenty of fluids and consuming foods high in fiber can help with constipation. See the list below for the fiber content of some common foods. Starches and Grains / Dietary Fiber (g)  Cheerios, 1 cup / 3 g  Kellogg's Corn Flakes, 1 cup / 0.7 g  Rice Krispies, 1  cup / 0.3  g  Quaker Oat Life Cereal,  cup / 2.1 g  Oatmeal, instant (cooked),  cup / 2 g  Kellogg's Frosted Mini Wheats, 1 cup / 5.1 g  Rice, brown, long-grain (cooked), 1 cup / 3.5 g  Rice, white, long-grain (cooked), 1 cup / 0.6 g  Macaroni, cooked, enriched, 1 cup / 2.5 g Legumes / Dietary Fiber (g)  Beans, baked, canned, plain or vegetarian,  cup / 5.2 g  Beans, kidney, canned,  cup / 6.8 g  Beans, pinto, dried (cooked),  cup / 7.7 g  Beans, pinto, canned,  cup / 5.5 g Breads and Crackers / Dietary Fiber (g)  Graham crackers, plain or honey, 2 squares / 0.7 g  Saltine crackers, 3 squares / 0.3 g  Pretzels, plain, salted, 10 pieces / 1.8 g  Bread, whole-wheat, 1 slice / 1.9 g  Bread, white, 1 slice / 0.7 g  Bread, raisin, 1 slice / 1.2 g  Bagel, plain, 3 oz / 2 g  Tortilla, flour, 1 oz / 0.9 g  Tortilla, corn, 1 small / 1.5 g  Bun, hamburger or hotdog, 1 small / 0.9 g Fruits / Dietary Fiber (g)  Apple, raw with skin, 1 medium / 4.4 g  Applesauce, sweetened,  cup / 1.5 g  Banana,  medium / 1.5 g  Grapes, 10 grapes / 0.4 g  Orange, 1 small / 2.3 g  Raisin, 1.5 oz / 1.6 g  Melon, 1 cup / 1.4 g Vegetables / Dietary Fiber (g)  Green beans, canned,  cup / 1.3 g  Carrots (cooked),  cup / 2.3 g  Broccoli (cooked),  cup / 2.8 g  Peas, frozen (cooked),  cup / 4.4 g  Potatoes, mashed,  cup / 1.6 g  Lettuce, 1 cup / 0.5 g  Corn, canned,  cup / 1.6 g  Tomato,  cup / 1.1 g Document Released: 03/20/2007 Document Revised: 01/24/2012 Document Reviewed: 05/15/2007 ExitCare Patient Information 2015 [employer]. This information is not intended to replace advice given to you by your health care provider. Make sure you discuss any questions you have with your health care provider.  

## 2014-05-13 NOTE — Progress Notes (Signed)
[facility] provided pt with a list of primary care resources, [facility] Orange Card application, and [employer]CA information, to help patient establish primary care.  °

## 2014-05-13 NOTE — ED Provider Notes (Signed)
Medical screening examination/treatment/procedure(s) were performed by non-physician practitioner and as supervising physician I was immediately available for consultation/collaboration.   EKG Interpretation None        Stephen Rancour, MD 05/13/14 1617 

## 2014-06-08 ENCOUNTER — Emergency Department (INDEPENDENT_AMBULATORY_CARE_PROVIDER_SITE_OTHER)
Admission: EM | Admit: 2014-06-08 | Discharge: 2014-06-08 | Disposition: A | Discharge status: Home / Self Care | Payer: Self-pay | Source: Home / Self Care | Attending: Family Medicine | Admitting: Family Medicine

## 2014-06-08 ENCOUNTER — Encounter (HOSPITAL_COMMUNITY): Payer: Self-pay | Admitting: Emergency Medicine

## 2014-06-08 DIAGNOSIS — K047 Periapical abscess without sinus: Secondary | ICD-10-CM

## 2014-06-08 DIAGNOSIS — X58XXXA Exposure to other specified factors, initial encounter: Secondary | ICD-10-CM

## 2014-06-08 DIAGNOSIS — IMO0002 Reserved for concepts with insufficient information to code with codable children: Secondary | ICD-10-CM

## 2014-06-08 DIAGNOSIS — K044 Acute apical periodontitis of pulpal origin: Secondary | ICD-10-CM

## 2014-06-08 DIAGNOSIS — S39012A Strain of muscle, fascia and tendon of lower back, initial encounter: Secondary | ICD-10-CM

## 2014-06-08 MED ORDER — CHLORHEXIDINE GLUCONATE 0.12 % MT SOLN: 15.0000 mL | Freq: Two times a day (BID) | OROMUCOSAL | Status: DC

## 2014-06-08 MED ORDER — KETOROLAC TROMETHAMINE 60 MG/2ML IM SOLN
60.0000 mg | Freq: Once | INTRAMUSCULAR | Status: AC
  Administered 2014-06-08: 60 mg via INTRAMUSCULAR

## 2014-06-08 MED ORDER — CLINDAMYCIN HCL 300 MG PO CAPS: 300.0000 mg | ORAL_CAPSULE | Freq: Three times a day (TID) | ORAL | Status: DC

## 2014-06-08 MED ORDER — KETOROLAC TROMETHAMINE 60 MG/2ML IM SOLN
INTRAMUSCULAR | Status: AC
  Filled 2014-06-08: qty 2

## 2014-06-08 NOTE — ED Provider Notes (Signed)
CSN: [id number]     Arrival date & time 06/08/14  1345 History   First MD Initiated Contact with Patient 06/08/14 1429     Chief Complaint  Patient presents with  . Dental Pain   (Consider location/radiation/quality/duration/timing/severity/associated sxs/prior Treatment)  Incision and Drainage Procedure Note  Pre-operative Diagnosis: abscess  Post-operative Diagnosis: same   Anesthesia: not needed  Procedure Details  The procedure, risks and complications have been discussed in detail (including, but not limited to airway compromise, infection, bleeding) with the patient, and the patient has signed consent to the procedure.  I&D with a 27g needle was performed on the left, maxillary peridontal gum line. Purulent drainage: present The patient was observed until stable.  Condition: Tolerated procedure well   Complications: none.   HPI  Dental pain: started 3 days ago. Bit on something hard which started the pain. Now w/ swelling in the gums. Denies fevers, discharge. Unable to tolerate PO on the affected side. L maxillary near 2nd molar. Cracked tooth a couple months ago. Ibuprofen 200mg  w/ benefit. No hot compresses. H/o dental infections.   Back pain: present for several weeks. Mid L back s/ some lateral radiation. Worse w/ certain moveemnts. Sudden onset when moving something. Getting worse to no change overall. Dull achy pain. Works out regularly but only chest and abs.    Past Medical History  Diagnosis Date  . Alcoholic fatty liver    History reviewed. No pertinent past surgical history. Family History  Problem Relation Age of Onset  . Diabetes Mother   . Cancer Father     liver  . Hypertension Father    History  Substance Use Topics  . Smoking status: Current Some Day Smoker -- 0.30 packs/day    Types: Cigarettes    Last Attempt to Quit: 10/24/2013  . Smokeless tobacco: Not on file  . Alcohol Use: No    Review of Systems Per HPI with all other  pertinent systems negative.   Allergies  Review of patient's allergies indicates no known allergies.  Home Medications   Prior to Admission medications   Medication Sig Start Date End Date Taking? Authorizing Provider  bisacodyl (DULCOLAX) 5 MG EC tablet Take 1 tablet (5 mg total) by mouth daily. 05/13/14   Tiffany Irine Seal, PA-C  chlorhexidine (PERIDEX) 0.12 % solution Use as directed 15 mLs in the mouth or throat 2 (two) times daily. 06/08/14   Ozella Rocks, MD  clindamycin (CLEOCIN) 300 MG capsule Take 1 capsule (300 mg total) by mouth 3 (three) times daily. 06/08/14   Ozella Rocks, MD  cyclobenzaprine (FLEXERIL) 5 MG tablet Take 1 tablet (5 mg total) by mouth 3 (three) times daily as needed for muscle spasms. 04/16/14   Ozella Rocks, MD  Multiple Vitamins-Minerals (CENTRUM ADULTS PO) Take 1 tablet by mouth daily.    Historical Provider, MD  omeprazole (PRILOSEC) 20 MG capsule Take 1 capsule (20 mg total) by mouth daily. 02/03/14   Marissa Sciacca, PA-C  polyethylene glycol (GOLYTELY) 236 G solution Take 4,000 mLs by mouth once. 05/13/14   Tiffany Irine Seal, PA-C  Probiotic Product (ALIGN) 4 MG CAPS Take 4 mg by mouth daily.    Historical Provider, MD  sodium phosphate (FLEET) 7-19 GM/118ML ENEM Place 133 mLs (1 enema total) rectally once. 05/13/14   Tiffany Irine Seal, PA-C  traMADol (ULTRAM) 50 MG tablet Take 1 tablet (50 mg total) by mouth every 6 (six) hours as needed for moderate pain. 04/16/14  Ozella Rocksavid J Merrell, MD   BP 131/77  Pulse 73  Temp(Src) 98.6 F (37 C) (Oral)  Resp 16  SpO2 100% Physical Exam  Constitutional: He appears well-developed and well-nourished. No distress.  HENT:  L 2nd molar on maxilla broken off at the level of the gum. peridontal small 0.5cm abscess w/ surounding erythema and ttp.   Eyes: Pupils are equal, round, and reactive to light.  Neck: Normal range of motion.  Cardiovascular: Normal rate, normal heart sounds and intact distal pulses.  Exam reveals  no gallop.   No murmur heard. Pulmonary/Chest: Effort normal and breath sounds normal. No respiratory distress.  Abdominal: Soft. He exhibits no distension.  Musculoskeletal:  L midback muscle tightness and mild ttp. FROm  Lymphadenopathy:    He has no cervical adenopathy.  Neurological: He exhibits normal muscle tone.  Skin: Skin is warm and dry. No rash noted. He is not diaphoretic. No erythema.  Psychiatric: He has a normal mood and affect. His behavior is normal. Judgment and thought content normal.    ED Course  Procedures (including critical care time) Labs Review Labs Reviewed - No data to display  Imaging Review No results found.   MDM   1. Dental infection   2. Back strain, initial encounter    Dental infection w/ perdontal abscess drained. Clinda (1 refill incase needed in order to get to appt w/ dentist. ) Chlorhexidine mouthwas prn to prevent future infections Back strain: toradol 60 in office. Demonstrated specific exercises. Time, rest, exercises, stretching, massage, heat.   Precautions given and all questions answered   Shelly Flattenavid Merrell, MD Family Medicine 06/08/2014, 2:57 PM      Ozella Rocksavid J Merrell, MD 06/08/14 [phone]

## 2014-06-08 NOTE — Discharge Instructions (Signed)
Your dental infection was drained today in our office Please start the antibiotics for the infection You can refill this once if needed in order to get you to your next dental appointment Please use the mouthwash from time to time to help prevent infection Please start on motrin 800 every 8 hours tomorrow at 3pm Please remember to exercise your back, perform stretches and try to get massages.  Your back strain could take several more weeks to resolve.

## 2014-06-22 ENCOUNTER — Emergency Department (HOSPITAL_COMMUNITY)
Admission: EM | Admit: 2014-06-22 | Discharge: 2014-06-23 | Disposition: A | Discharge status: Home / Self Care | Payer: Self-pay | Attending: Emergency Medicine | Admitting: Emergency Medicine

## 2014-06-22 ENCOUNTER — Encounter (HOSPITAL_COMMUNITY): Payer: Self-pay | Admitting: Emergency Medicine

## 2014-06-22 DIAGNOSIS — R200 Anesthesia of skin: Secondary | ICD-10-CM

## 2014-06-22 DIAGNOSIS — Y939 Activity, unspecified: Secondary | ICD-10-CM | POA: Insufficient documentation

## 2014-06-22 DIAGNOSIS — Z79899 Other long term (current) drug therapy: Secondary | ICD-10-CM | POA: Insufficient documentation

## 2014-06-22 DIAGNOSIS — Z8719 Personal history of other diseases of the digestive system: Secondary | ICD-10-CM | POA: Insufficient documentation

## 2014-06-22 DIAGNOSIS — M546 Pain in thoracic spine: Secondary | ICD-10-CM | POA: Insufficient documentation

## 2014-06-22 DIAGNOSIS — I10 Essential (primary) hypertension: Secondary | ICD-10-CM | POA: Insufficient documentation

## 2014-06-22 DIAGNOSIS — S239XXA Sprain of unspecified parts of thorax, initial encounter: Secondary | ICD-10-CM | POA: Insufficient documentation

## 2014-06-22 DIAGNOSIS — R202 Paresthesia of skin: Secondary | ICD-10-CM

## 2014-06-22 DIAGNOSIS — S29019A Strain of muscle and tendon of unspecified wall of thorax, initial encounter: Secondary | ICD-10-CM

## 2014-06-22 DIAGNOSIS — Z792 Long term (current) use of antibiotics: Secondary | ICD-10-CM | POA: Insufficient documentation

## 2014-06-22 DIAGNOSIS — Y929 Unspecified place or not applicable: Secondary | ICD-10-CM | POA: Insufficient documentation

## 2014-06-22 DIAGNOSIS — IMO0001 Reserved for inherently not codable concepts without codable children: Secondary | ICD-10-CM | POA: Insufficient documentation

## 2014-06-22 DIAGNOSIS — F17200 Nicotine dependence, unspecified, uncomplicated: Secondary | ICD-10-CM | POA: Insufficient documentation

## 2014-06-22 DIAGNOSIS — X58XXXA Exposure to other specified factors, initial encounter: Secondary | ICD-10-CM | POA: Insufficient documentation

## 2014-06-22 DIAGNOSIS — R209 Unspecified disturbances of skin sensation: Secondary | ICD-10-CM | POA: Insufficient documentation

## 2014-06-22 HISTORY — DX: Essential (primary) hypertension: I10

## 2014-06-22 NOTE — ED Notes (Signed)
Pt arrived to the Ed with a complaint of back pain.  Pt states he has had chronic back pain for some time.  Pt states  This week the pain localized in the left upper back and it began radiating to his left arm causing it to go numb and make his fingers tingle.

## 2014-06-23 MED ORDER — METHOCARBAMOL 500 MG PO TABS
1000.0000 mg | ORAL_TABLET | Freq: Once | ORAL | Status: AC
  Administered 2014-06-23: 1000 mg via ORAL
  Filled 2014-06-23: qty 2

## 2014-06-23 MED ORDER — KETOROLAC TROMETHAMINE 60 MG/2ML IM SOLN
60.0000 mg | Freq: Once | INTRAMUSCULAR | Status: AC
  Administered 2014-06-23: 60 mg via INTRAMUSCULAR
  Filled 2014-06-23: qty 2

## 2014-06-23 MED ORDER — METHOCARBAMOL 500 MG PO TABS: 1000.0000 mg | ORAL_TABLET | Freq: Two times a day (BID) | ORAL | Status: DC

## 2014-06-23 MED ORDER — IBUPROFEN 600 MG PO TABS: 600.0000 mg | ORAL_TABLET | Freq: Three times a day (TID) | ORAL | Status: DC

## 2014-06-23 NOTE — Discharge Instructions (Signed)
Take medications as prescribed. Wear wrist splint especially at night. If you continue having numbness and tingling in the hand after a week, followup with hand surgery.  Muscle Strain A muscle strain is an injury that occurs when a muscle is stretched beyond its normal length. Usually a small number of muscle fibers are torn when this happens. Muscle strain is rated in degrees. First-degree strains have the least amount of muscle fiber tearing and pain. Second-degree and third-degree strains have increasingly more tearing and pain.  Usually, recovery from muscle strain takes 1-2 weeks. Complete healing takes 5-6 weeks.  CAUSES  Muscle strain happens when a sudden, violent force placed on a muscle stretches it too far. This may occur with lifting, sports, or a fall.  RISK FACTORS Muscle strain is especially common in athletes.  SIGNS AND SYMPTOMS At the site of the muscle strain, there may be:  Pain.  Bruising.  Swelling.  Difficulty using the muscle due to pain or lack of normal function. DIAGNOSIS  Your health care provider will perform a physical exam and ask about your medical history. TREATMENT  Often, the best treatment for a muscle strain is resting, icing, and applying cold compresses to the injured area.  HOME CARE INSTRUCTIONS   Use the PRICE method of treatment to promote muscle healing during the first 2-3 days after your injury. The PRICE method involves:  Protecting the muscle from being injured again.  Restricting your activity and resting the injured body part.  Icing your injury. To do this, put ice in a plastic bag. Place a towel between your skin and the bag. Then, apply the ice and leave it on from 15-20 minutes each hour. After the third day, switch to moist heat packs.  Apply compression to the injured area with a splint or elastic bandage. Be careful not to wrap it too tightly. This may interfere with blood circulation or increase swelling.  Elevate the  injured body part above the level of your heart as often as you can.  Only take over-the-counter or prescription medicines for pain, discomfort, or fever as directed by your health care provider.  Warming up prior to exercise helps to prevent future muscle strains. SEEK MEDICAL CARE IF:   You have increasing pain or swelling in the injured area.  You have numbness, tingling, or a significant loss of strength in the injured area. MAKE SURE YOU:   Understand these instructions.  Will watch your condition.  Will get help right away if you are not doing well or get worse. Document Released: 11/01/2005 Document Revised: 08/22/2013 Document Reviewed: 05/31/2013 [facility] Patient Information 2015 [place], [place]. This information is not intended to replace advice given to you by your health care provider. Make sure you discuss any questions you have with your health care provider. Carpal Tunnel Syndrome The carpal tunnel is a narrow area located on the palm side of your wrist. The tunnel is formed by the wrist bones and ligaments. Nerves, blood vessels, and tendons pass through the carpal tunnel. Repeated wrist motion or certain diseases may cause swelling within the tunnel. This swelling pinches the main nerve in the wrist (median nerve) and causes the painful hand and arm condition called carpal tunnel syndrome. CAUSES   Repeated wrist motions.  Wrist injuries.  Certain diseases like arthritis, diabetes, alcoholism, hyperthyroidism, and kidney failure.  Obesity.  Pregnancy. SYMPTOMS   A "pins and needles" feeling in your fingers or hand, especially in your thumb, index and middle fingers.  Tingling or numbness in your fingers or hand.  An aching feeling in your entire arm, especially when your wrist and elbow are bent for long periods of time.  Wrist pain that goes up your arm to your shoulder.  Pain that goes down into your palm or fingers.  A weak feeling in your  hands. DIAGNOSIS  Your health care provider will take your history and perform a physical exam. An electromyography test may be needed. This test measures electrical signals sent out by your nerves into the muscles. The electrical signals are usually slowed by carpal tunnel syndrome. You may also need X-rays. TREATMENT  Carpal tunnel syndrome may clear up by itself. Your health care provider may recommend a wrist splint or medicine such as a nonsteroidal anti-inflammatory medicine. Cortisone injections may help. Sometimes, surgery may be needed to free the pinched nerve.  HOME CARE INSTRUCTIONS   Take all medicine as directed by your health care provider. Only take over-the-counter or prescription medicines for pain, discomfort, or fever as directed by your health care provider.  If you were given a splint to keep your wrist from bending, wear it as directed. It is important to wear the splint at night. Wear the splint for as long as you have pain or numbness in your hand, arm, or wrist. This may take 1 to 2 months.  Rest your wrist from any activity that may be causing your pain. If your symptoms are work-related, you may need to talk to your employer about changing to a job that does not require using your wrist.  Put ice on your wrist after long periods of wrist activity.  Put ice in a plastic bag.  Place a towel between your skin and the bag.  Leave the ice on for 15-20 minutes, 03-04 times a day.  Keep all follow-up visits as directed by your health care provider. This includes any orthopedic referrals, physical therapy, and rehabilitation. Any delay in getting necessary care could result in a delay or failure of your condition to heal. SEEK IMMEDIATE MEDICAL CARE IF:   You have new, unexplained symptoms.  Your symptoms get worse and are not helped or controlled with medicines. MAKE SURE YOU:   Understand these instructions.  Will watch your condition.  Will get help right away  if you are not doing well or get worse. Document Released: 10/29/2000 Document Revised: 03/18/2014 Document Reviewed: 09/17/2011 [facility] Patient Information 2015 [place], [place]. This information is not intended to replace advice given to you by your health care provider. Make sure you discuss any questions you have with your health care provider.

## 2014-06-23 NOTE — ED Provider Notes (Signed)
CSN: [id number]     Arrival date & time 06/22/14  2258 History   First MD Initiated Contact with Patient 06/23/14 0026     Chief Complaint  Patient presents with  . Back Pain     (Consider location/radiation/quality/duration/timing/severity/associated sxs/prior Treatment) HPI Patient presents with left sided thoracic pain. He states he's had this off and on for the past few months. Is worse in the last few days. Is worse with movement or palpation. Denies any recent trauma. No shortness of breath. No cough. No fever or chills.  Patient also complains with left hand numbness especially at night and when waking up in the morning. Symptoms are mostly over the first 3 digits of the left hand. He currently has no numbness. Denies any weakness. Past Medical History  Diagnosis Date  . Alcoholic fatty liver   . Hypertension    History reviewed. No pertinent past surgical history. Family History  Problem Relation Age of Onset  . Diabetes Mother   . Cancer Father     liver  . Hypertension Father    History  Substance Use Topics  . Smoking status: Current Some Day Smoker -- 0.30 packs/day    Types: Cigarettes    Last Attempt to Quit: 10/24/2013  . Smokeless tobacco: Not on file  . Alcohol Use: No    Review of Systems  Constitutional: Negative for fever and chills.  Respiratory: Negative for cough and shortness of breath.   Cardiovascular: Negative for chest pain.  Gastrointestinal: Negative for nausea and abdominal pain.  Musculoskeletal: Positive for back pain and myalgias. Negative for neck pain and neck stiffness.  Skin: Negative for rash and wound.  Neurological: Positive for numbness. Negative for dizziness, weakness, light-headedness and headaches.  All other systems reviewed and are negative.     Allergies  Review of patient's allergies indicates no known allergies.  Home Medications   Prior to Admission medications   Medication Sig Start Date End Date Taking?  Authorizing Provider  chlorhexidine (PERIDEX) 0.12 % solution Use as directed 15 mLs in the mouth or throat 2 (two) times daily. 06/08/14  Yes Ozella Rocks, MD  clindamycin (CLEOCIN) 300 MG capsule Take 1 capsule (300 mg total) by mouth 3 (three) times daily. 06/08/14  Yes Ozella Rocks, MD  Ibuprofen-Diphenhydramine Cit (MOTRIN PM) 200-38 MG TABS Take 2 tablets by mouth at bedtime as needed (for back pain.).   Yes Historical Provider, MD  Multiple Vitamins-Minerals (CENTRUM ADULTS PO) Take 1 tablet by mouth daily.   Yes Historical Provider, MD  omeprazole (PRILOSEC) 20 MG capsule Take 20 mg by mouth daily as needed (for acid reflux).   Yes Historical Provider, MD  Probiotic Product (ALIGN) 4 MG CAPS Take 4 mg by mouth daily.   Yes Historical Provider, MD   BP 135/88  Pulse 71  Temp(Src) 98.3 F (36.8 C) (Oral)  Resp 16  SpO2 100% Physical Exam  Nursing note and vitals reviewed. Constitutional: He is oriented to person, place, and time. He appears well-developed and well-nourished. No distress.  HENT:  Head: Normocephalic and atraumatic.  Mouth/Throat: Oropharynx is clear and moist.  Eyes: EOM are normal. Pupils are equal, round, and reactive to light.  Neck: Normal range of motion. Neck supple.  Cardiovascular: Normal rate and regular rhythm.   Pulmonary/Chest: Effort normal and breath sounds normal. No respiratory distress. He has no wheezes. He has no rales. He exhibits no tenderness.  Abdominal: Soft. Bowel sounds are normal. He exhibits no distension  and no mass. There is no tenderness. There is no rebound and no guarding.  Musculoskeletal: Normal range of motion. He exhibits tenderness (patient there is palpation in the inferior left side of his thoracic back. His chest medial to the scapular border. He has no midline thoracic tenderness.). He exhibits no edema.  Negative Tinel's and Phalen sign. Good cap refill. Radial pulses 2+.  Neurological: He is alert and oriented to person,  place, and time.  Patient is alert and oriented x3 with clear, goal oriented speech. Patient has 5/5 motor in all extremities. Interested muscles of the hand intact. Sensation is intact to light touch. Patient has a normal gait and walks without assistance.   Skin: Skin is warm and dry. No rash noted. No erythema.  Psychiatric: He has a normal mood and affect. His behavior is normal.    ED Course  Procedures (including critical care time) Labs Review Labs Reviewed - No data to display  Imaging Review No results found.   EKG Interpretation None      MDM   Final diagnoses:  None    Back complaint consistent with thoracic muscle strain. I don't believe is related to the numbness is having in the hand stating that the location is inferior thoracic region. The numbness patient is having the hand may be due to carpal tunnel syndrome. We'll place in a splint and start on NSAIDs. If symptoms continue the patient will need to followup with hand surgery. Return precautions given.    Loren Raceravid Corion Sherrod, MD 06/23/14 [phone]

## 2014-06-28 ENCOUNTER — Encounter (HOSPITAL_COMMUNITY): Payer: Self-pay | Admitting: Emergency Medicine

## 2014-06-28 ENCOUNTER — Emergency Department (INDEPENDENT_AMBULATORY_CARE_PROVIDER_SITE_OTHER)
Admission: EM | Admit: 2014-06-28 | Discharge: 2014-06-28 | Disposition: A | Discharge status: Home / Self Care | Payer: Self-pay | Source: Home / Self Care | Attending: Family Medicine | Admitting: Family Medicine

## 2014-06-28 DIAGNOSIS — M546 Pain in thoracic spine: Secondary | ICD-10-CM

## 2014-06-28 NOTE — ED Notes (Signed)
Patient c/o upper back pain. This is a chronic problem. Patient reports he has been getting shots and was given Rx for muscle relaxer but is concerned about his fatty liver. Patient reports he feels the pain when he bends a certain way. Patient is alert and oriented and in no acute distress.

## 2014-06-28 NOTE — Discharge Instructions (Signed)
Back Exercises °Back exercises help treat and prevent back injuries. The goal of back exercises is to increase the strength of your abdominal and back muscles and the flexibility of your back. These exercises should be started when you no longer have back pain. Back exercises include: °· Pelvic Tilt. Lie on your back with your knees bent. Tilt your pelvis until the lower part of your back is against the floor. Hold this position 5 to 10 sec and repeat 5 to 10 times. °· Knee to Chest. Pull first 1 knee up against your chest and hold for 20 to 30 seconds, repeat this with the other knee, and then both knees. This may be done with the other leg straight or bent, whichever feels better. °· Sit-Ups or Curl-Ups. Bend your knees 90 degrees. Start with tilting your pelvis, and do a partial, slow sit-up, lifting your trunk only 30 to 45 degrees off the floor. Take at least 2 to 3 seconds for each sit-up. Do not do sit-ups with your knees out straight. If partial sit-ups are difficult, simply do the above but with only tightening your abdominal muscles and holding it as directed. °· Hip-Lift. Lie on your back with your knees flexed 90 degrees. Push down with your feet and shoulders as you raise your hips a couple inches off the floor; hold for 10 seconds, repeat 5 to 10 times. °· Back arches. Lie on your stomach, propping yourself up on bent elbows. Slowly press on your hands, causing an arch in your low back. Repeat 3 to 5 times. Any initial stiffness and discomfort should lessen with repetition over time. °· Shoulder-Lifts. Lie face down with arms beside your body. Keep hips and torso pressed to floor as you slowly lift your head and shoulders off the floor. °Do not overdo your exercises, especially in the beginning. Exercises may cause you some mild back discomfort which lasts for a few minutes; however, if the pain is more severe, or lasts for more than 15 minutes, do not continue exercises until you see your caregiver.  Improvement with exercise therapy for back problems is slow.  °See your caregivers for assistance with developing a proper back exercise program. °Document Released: 12/09/2004 Document Revised: 01/24/2012 Document Reviewed: 09/02/2011 °ExitCare® Patient Information ©2015 [employer], LLC. This information is not intended to replace advice given to you by your health care provider. Make sure you discuss any questions you have with your health care provider. ° °Back Pain, Adult °Low back pain is very common. About 1 in 5 people have back pain. The cause of low back pain is rarely dangerous. The pain often gets better over time. About half of people with a sudden onset of back pain feel better in just 2 weeks. About 8 in 10 people feel better by 6 weeks.  °CAUSES °Some common causes of back pain include: °· Strain of the muscles or ligaments supporting the spine. °· Wear and tear (degeneration) of the spinal discs. °· Arthritis. °· Direct injury to the back. °DIAGNOSIS °Most of the time, the direct cause of low back pain is not known. However, back pain can be treated effectively even when the exact cause of the pain is unknown. Answering your caregiver's questions about your overall health and symptoms is one of the most accurate ways to make sure the cause of your pain is not dangerous. If your caregiver needs more information, he or she may order lab work or imaging tests (X-rays or MRIs). However, even if imaging tests show changes in your   back, this usually does not require surgery. °HOME CARE INSTRUCTIONS °For many people, back pain returns. Since low back pain is rarely dangerous, it is often a condition that people can learn to manage on their own.  °· Remain active. It is stressful on the back to sit or stand in one place. Do not sit, drive, or stand in one place for more than 30 minutes at a time. Take short walks on level surfaces as soon as pain allows. Try to increase the length of time you walk each  day. °· Do not stay in bed. Resting more than 1 or 2 days can delay your recovery. °· Do not avoid exercise or work. Your body is made to move. It is not dangerous to be active, even though your back may hurt. Your back will likely heal faster if you return to being active before your pain is gone. °· Pay attention to your body when you  bend and lift. Many people have less discomfort when lifting if they bend their knees, keep the load close to their bodies, and avoid twisting. Often, the most comfortable positions are those that put less stress on your recovering back. °· Find a comfortable position to sleep. Use a firm mattress and lie on your side with your knees slightly bent. If you lie on your back, put a pillow under your knees. °· Only take over-the-counter or prescription medicines as directed by your caregiver. Over-the-counter medicines to reduce pain and inflammation are often the most helpful. Your caregiver may prescribe muscle relaxant drugs. These medicines help dull your pain so you can more quickly return to your normal activities and healthy exercise. °· Put ice on the injured area. °¨ Put ice in a plastic bag. °¨ Place a towel between your skin and the bag. °¨ Leave the ice on for 15-20 minutes, 03-04 times a day for the first 2 to 3 days. After that, ice and heat may be alternated to reduce pain and spasms. °· Ask your caregiver about trying back exercises and gentle massage. This may be of some benefit. °· Avoid feeling anxious or stressed. Stress increases muscle tension and can worsen back pain. It is important to recognize when you are anxious or stressed and learn ways to manage it. Exercise is a great option. °SEEK MEDICAL CARE IF: °· You have pain that is not relieved with rest or medicine. °· You have pain that does not improve in 1 week. °· You have new symptoms. °· You are generally not feeling well. °SEEK IMMEDIATE MEDICAL CARE IF:  °· You have pain that radiates from your back into  your legs. °· You develop new bowel or bladder control problems. °· You have unusual weakness or numbness in your arms or legs. °· You develop nausea or vomiting. °· You develop abdominal pain. °· You feel faint. °Document Released: 11/01/2005 Document Revised: 05/02/2012 Document Reviewed: 03/05/2014 °ExitCare® Patient Information ©2015 [employer], LLC. This information is not intended to replace advice given to you by your health care provider. Make sure you discuss any questions you have with your health care provider. ° °Chronic Back Pain ° When back pain lasts longer than 3 months, it is called chronic back pain. People with chronic back pain often go through certain periods that are more intense (flare-ups).  °CAUSES °Chronic back pain can be caused by wear and tear (degeneration) on different structures in your back. These structures include: °· The bones of your spine (vertebrae) and the joints surrounding your spinal cord and nerve roots (facets). °· The strong, fibrous tissues that connect your   vertebrae (ligaments). °Degeneration of these structures may result in pressure on your nerves. This can lead to constant pain. °HOME CARE INSTRUCTIONS °· Avoid bending, heavy lifting, prolonged sitting, and activities which make the problem worse. °· Take brief periods of rest throughout the day to reduce your pain. Lying down or standing usually is better than sitting while you are resting. °· Take over-the-counter or prescription medicines only as directed by your caregiver. °SEEK IMMEDIATE MEDICAL CARE IF:  °· You have weakness or numbness in one of your legs or feet. °· You have trouble controlling your bladder or bowels. °· You have nausea, vomiting, abdominal pain, shortness of breath, or fainting. °Document Released: 12/09/2004 Document Revised: 01/24/2012 Document Reviewed: 10/16/2011 °ExitCare® Patient Information ©2015 [employer], LLC. This information is not intended to replace advice given to you by your  health care provider. Make sure you discuss any questions you have with your health care provider. ° °

## 2014-06-28 NOTE — ED Provider Notes (Signed)
CSN: [id number]     Arrival date & time 06/28/14  1011 History   First MD Initiated Contact with Patient 06/28/14 1030     Chief Complaint  Patient presents with  . Back Pain   (Consider location/radiation/quality/duration/timing/severity/associated sxs/prior Treatment) HPI Comments: Patient presents with complaints of intermittent left sided midthoracic back pain since March 2015. No reported injury. States he typically feel fine when he wakes in the mornings but back will begin to become uncomfortable as day progresses, particularly if he has periods of prolonged standing.  PCP: none Has been taking ibuprofen and robaxin as prescribed during recent ER visit. States these medications bring him relief, but he is frustrated that symptoms continue to reoccur. 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  Right hand dominant Denies fever, hemoptysis, chest pain or dyspnea. No GI or GU sx.   The history is provided by the patient.    Past Medical History  Diagnosis Date  . Alcoholic fatty liver   . Hypertension    History reviewed. No pertinent past surgical history. Family History  Problem Relation Age of Onset  . Diabetes Mother   . Cancer Father     liver  . Hypertension Father    History  Substance Use Topics  . Smoking status: Current Some Day Smoker -- 0.30 packs/day    Types: Cigarettes    Last Attempt to Quit: 10/24/2013  . Smokeless tobacco: Not on file  . Alcohol Use: No    Review of Systems  All other systems reviewed and are negative.   Allergies  Review of patient's allergies indicates no known allergies.  Home Medications   Prior to Admission medications   Medication Sig Start Date End Date Taking? Authorizing Provider  chlorhexidine (PERIDEX) 0.12 % solution Use as directed 15 mLs in the mouth or throat 2 (two) times daily. 06/08/14   Ozella Rocksavid J Merrell, MD  clindamycin (CLEOCIN) 300 MG capsule Take 1 capsule (300 mg total) by mouth 3 (three) times daily. 06/08/14   Ozella Rocksavid  J Merrell, MD  ibuprofen (ADVIL,MOTRIN) 600 MG tablet Take 1 tablet (600 mg total) by mouth 3 (three) times daily after meals. 06/23/14   Loren Raceravid Yelverton, MD  Ibuprofen-Diphenhydramine Cit (MOTRIN PM) 200-38 MG TABS Take 2 tablets by mouth at bedtime as needed (for back pain.).    Historical Provider, MD  methocarbamol (ROBAXIN) 500 MG tablet Take 2 tablets (1,000 mg total) by mouth 2 (two) times daily. 06/23/14   Loren Raceravid Yelverton, MD  Multiple Vitamins-Minerals (CENTRUM ADULTS PO) Take 1 tablet by mouth daily.    Historical Provider, MD  omeprazole (PRILOSEC) 20 MG capsule Take 20 mg by mouth daily as needed (for acid reflux).    Historical Provider, MD  Probiotic Product (ALIGN) 4 MG CAPS Take 4 mg by mouth daily.    Historical Provider, MD   BP 145/87  Pulse 78  Temp(Src) 98.1 F (36.7 C) (Oral)  Resp 14  SpO2 100% Physical Exam  Nursing note and vitals reviewed. Constitutional: He is oriented to person, place, and time. He appears well-developed and well-nourished. No distress.  HENT:  Head: Normocephalic and atraumatic.  Eyes: Conjunctivae are normal.  Neck: Normal range of motion. Neck supple.  Cardiovascular: Normal rate, regular rhythm and normal heart sounds.   Pulmonary/Chest: Effort normal and breath sounds normal. No respiratory distress. He has no wheezes. He exhibits no tenderness.  Abdominal: Soft. Bowel sounds are normal. He exhibits no distension. There is no tenderness.  Musculoskeletal: Normal range of  motion.       Thoracic back: He exhibits normal range of motion.       Back:  Neurological: He is alert and oriented to person, place, and time.  Skin: Skin is warm and dry. No rash noted. No erythema.  Psychiatric: He has a normal mood and affect. His behavior is normal.    ED Course  Procedures (including critical care time) Labs Review Labs Reviewed - No data to display  Imaging Review No results found.   MDM   1. Left-sided thoracic back pain    Will  advised continued use of ibuprofen and robaxin as prescribed and refer to [facility] for further evaluation and treatment. Arranged for patient to meet with clinic patient access coordinator for insurance coverage to fund visits to [facility] medicine center. Patient to call for appointment.     Ria Clock, [place] 06/28/14 1058

## 2014-06-28 NOTE — ED Provider Notes (Signed)
Medical screening examination/treatment/procedure(s) were performed by resident physician or non-physician practitioner and as supervising physician I was immediately available for consultation/collaboration.   Elijan Googe DOUGLAS MD.   Jasmeen Fritsch D Quency Tober, MD 06/28/14 1448 

## 2014-07-08 ENCOUNTER — Ambulatory Visit (INDEPENDENT_AMBULATORY_CARE_PROVIDER_SITE_OTHER): Payer: Self-pay | Admitting: Family Medicine

## 2014-07-08 ENCOUNTER — Encounter: Payer: Self-pay | Admitting: Family Medicine

## 2014-07-08 VITALS — BP 130/82 | HR 65 | Ht 71.0 in | Wt 204.0 lb

## 2014-07-08 DIAGNOSIS — G2589 Other specified extrapyramidal and movement disorders: Secondary | ICD-10-CM

## 2014-07-08 DIAGNOSIS — R279 Unspecified lack of coordination: Secondary | ICD-10-CM

## 2014-07-11 ENCOUNTER — Encounter: Payer: Self-pay | Admitting: Family Medicine

## 2014-07-11 DIAGNOSIS — G2589 Other specified extrapyramidal and movement disorders: Secondary | ICD-10-CM | POA: Insufficient documentation

## 2014-07-11 NOTE — Assessment & Plan Note (Signed)
I am not sure if his pain is due to rhomboid strain or 2 true scapular dyskinesis and winging. We discussed some range of motion exercises, avoiding activities that increased pain and some rhomboid strengthening. I also discussed the fact that this usually resolves in one to 2 years spontaneously. If it worsens, he should return to clinic otherwise followup when necessary

## 2014-07-11 NOTE — Progress Notes (Signed)
Patient ID: Douglas Wong, male   DOB: Mar 25, 1981, 33 y.o.   MRN: [id number]  Douglas Wong - 33 y.o. male MRN [id number]  Date of birth: Sep 21, 1981    SUBJECTIVE:     Left upper back pain since March. NCD is in nature. No specific injury. It is aggravating, intermittent. Notices it more she's been standing for a long period of time. He works as a Investment banker, operational. He stands a lot. For exercise he occasionally lifts weights and has had no pain while lifting weights. He has not changed his workout routine in fact is doing less weightlifting than usual. ROS:     No unusual weight change, no fever, sweats, chills. No upper extremity numbness.  PERTINENT  PMH / PSH FH / / SH:  Past Medical, Surgical, Social, and Family History Reviewed & Updated in the EMR.  Pertinent findings include:  No history of back or shoulder injury. Smoker No personal history of diabetes mellitus Overweight  OBJECTIVE: BP 130/82  Pulse 65  Ht  (1.803 m)  Wt 204 lb (92.534 kg)  BMI 28.46 kg/m2  Physical Exam:  Vital signs are reviewed. GENERAL: Well-developed male mildly overweight no acute distress Shoulders: Symmetrical. Normal strength and range of motion in all planes the rotator cuff bilaterally SCAPULAR: Left scapular winging of the medial border. Mild tenderness to palpation over the left rhomboid area and this reproduces his pain. NEURO: Intact sensation to soft touch bilaterally upper extremity and hands. Skin: No skin lesions or rash upper back. SPINE Thoracic spine is nontender to palpation and percussion. There is no deformity.   IMAGING: I reviewed the lumbar spine and abdominal films from June, 2015. Other than large on the stool noted in the: There is no significant acute abnormality. ASSESSMENT & PLAN:  See problem based charting & AVS for pt instructions.

## 2014-07-21 ENCOUNTER — Encounter (HOSPITAL_COMMUNITY): Payer: Self-pay | Admitting: Emergency Medicine

## 2014-07-21 ENCOUNTER — Emergency Department (HOSPITAL_COMMUNITY)
Admission: EM | Admit: 2014-07-21 | Discharge: 2014-07-21 | Disposition: A | Discharge status: Home / Self Care | Payer: Self-pay | Attending: Emergency Medicine | Admitting: Emergency Medicine

## 2014-07-21 DIAGNOSIS — X58XXXA Exposure to other specified factors, initial encounter: Secondary | ICD-10-CM | POA: Insufficient documentation

## 2014-07-21 DIAGNOSIS — Z8719 Personal history of other diseases of the digestive system: Secondary | ICD-10-CM | POA: Insufficient documentation

## 2014-07-21 DIAGNOSIS — IMO0002 Reserved for concepts with insufficient information to code with codable children: Secondary | ICD-10-CM | POA: Insufficient documentation

## 2014-07-21 DIAGNOSIS — M549 Dorsalgia, unspecified: Secondary | ICD-10-CM | POA: Insufficient documentation

## 2014-07-21 DIAGNOSIS — Z79899 Other long term (current) drug therapy: Secondary | ICD-10-CM | POA: Insufficient documentation

## 2014-07-21 DIAGNOSIS — I10 Essential (primary) hypertension: Secondary | ICD-10-CM | POA: Insufficient documentation

## 2014-07-21 DIAGNOSIS — Y929 Unspecified place or not applicable: Secondary | ICD-10-CM | POA: Insufficient documentation

## 2014-07-21 DIAGNOSIS — T148XXA Other injury of unspecified body region, initial encounter: Secondary | ICD-10-CM

## 2014-07-21 DIAGNOSIS — F17200 Nicotine dependence, unspecified, uncomplicated: Secondary | ICD-10-CM | POA: Insufficient documentation

## 2014-07-21 DIAGNOSIS — Y939 Activity, unspecified: Secondary | ICD-10-CM | POA: Insufficient documentation

## 2014-07-21 MED ORDER — METHOCARBAMOL 500 MG PO TABS: 500.0000 mg | ORAL_TABLET | Freq: Two times a day (BID) | ORAL | Status: DC | PRN

## 2014-07-21 NOTE — ED Provider Notes (Signed)
Medical screening examination/treatment/procedure(s) were performed by non-physician practitioner and as supervising physician I was immediately available for consultation/collaboration.   EKG Interpretation None        Ajee Heasley, MD 07/21/14 1607 

## 2014-07-21 NOTE — ED Notes (Signed)
Pt states that since march, he has had back pain that radiates up to neck and down legs causing his legs and arms to go numb.  States he has been seen for this and they keep telling him it is a muscle strain.  States that is not a muscle strain and that he needs further work up.  States he has not been to a neurologist because he has to pay out of pocket.  Pt in NAD at this time.  Denies injury.

## 2014-07-21 NOTE — Discharge Instructions (Signed)
Muscle Strain °A muscle strain is an injury that occurs when a muscle is stretched beyond its normal length. Usually a small number of muscle fibers are torn when this happens. Muscle strain is rated in degrees. First-degree strains have the least amount of muscle fiber tearing and pain. Second-degree and third-degree strains have increasingly more tearing and pain.  °Usually, recovery from muscle strain takes 1-2 weeks. Complete healing takes 5-6 weeks.  °CAUSES  °Muscle strain happens when a sudden, violent force placed on a muscle stretches it too far. This may occur with lifting, sports, or a fall.  °RISK FACTORS °Muscle strain is especially common in athletes.  °SIGNS AND SYMPTOMS °At the site of the muscle strain, there may be: °· Pain. °· Bruising. °· Swelling. °· Difficulty using the muscle due to pain or lack of normal function. °DIAGNOSIS  °Your health care provider will perform a physical exam and ask about your medical history. °TREATMENT  °Often, the best treatment for a muscle strain is resting, icing, and applying cold compresses to the injured area.   °HOME CARE INSTRUCTIONS  °· Use the PRICE method of treatment to promote muscle healing during the first 2-3 days after your injury. The PRICE method involves: °¨ Protecting the muscle from being injured again. °¨ Restricting your activity and resting the injured body part. °¨ Icing your injury. To do this, put ice in a plastic bag. Place a towel between your skin and the bag. Then, apply the ice and leave it on from 15-20 minutes each hour. After the third day, switch to moist heat packs. °¨ Apply compression to the injured area with a splint or elastic bandage. Be careful not to wrap it too tightly. This may interfere with blood circulation or increase swelling. °¨ Elevate the injured body part above the level of your heart as often as you can. °· Only take over-the-counter or prescription medicines for pain, discomfort, or fever as directed by your  health care provider. °· Warming up prior to exercise helps to prevent future muscle strains. °SEEK MEDICAL CARE IF:  °· You have increasing pain or swelling in the injured area. °· You have numbness, tingling, or a significant loss of strength in the injured area. °MAKE SURE YOU:  °· Understand these instructions. °· Will watch your condition. °· Will get help right away if you are not doing well or get worse. °Document Released: 11/01/2005 Document Revised: 08/22/2013 Document Reviewed: 05/31/2013 °ExitCare® Patient Information ©2015 [employer]. This information is not intended to replace advice given to you by your health care provider. Make sure you discuss any questions you have with your health care provider. ° ° °Emergency Department Resource Guide °1) Find a Doctor and Pay Out of Pocket °Although you won't have to find out who is covered by your insurance plan, it is a good idea to ask around and get recommendations. You will then need to call the office and see if the doctor you have chosen will accept you as a new patient and what types of options they offer for patients who are self-pay. Some doctors offer discounts or will set up payment plans for their patients who do not have insurance, but you will need to ask so you aren't surprised when you get to your appointment. ° °2) Contact Your Local Health Department °Not all health departments have doctors that can see patients for sick visits, but many do, so it is worth a call to see if yours does. If you don't   know where your local health department is, you can check in your phone book. The CDC also has a tool to help you locate your state's health department, and many state websites also have listings of all of their local health departments. ° °3) Find a Walk-in Clinic °If your illness is not likely to be very severe or complicated, you may want to try a walk in clinic. These are popping up all over the country in pharmacies, drugstores, and shopping  centers. They're usually staffed by nurse practitioners or physician assistants that have been trained to treat common illnesses and complaints. They're usually fairly quick and inexpensive. However, if you have serious medical issues or chronic medical problems, these are probably not your best option. ° °No Primary Care Doctor: °- Call [facility] at  832-8000 - they can help you locate a primary care doctor that  accepts your insurance, provides certain services, etc. °- Physician Referral Service- 1-800-533-3463 ° °Chronic Pain Problems: °Organization         Address  Phone   Notes  °[facility]  (336) 297-2271 Patients need to be referred by their primary care doctor.  ° °Medication Assistance: °Organization         Address  Phone   Notes  °[place] [facility] [address] °[place], [place] [address] (336) 641-8030 --Must be a resident of [place] °-- Must have NO insurance coverage whatsoever (no Medicaid/ Medicare, etc.) °-- The pt. MUST have a primary care doctor that directs their care regularly and follows them in the community °  °[facility]  (866) 331-1348   °[employer]  (888) 892-1162   ° °Agencies that provide inexpensive medical care: °Organization         Address  Phone   Notes  °[facility]  (336) 832-8035   °[facility]    (336) 832-7272   °[facility] [address] °[place], [place] [address] (336) 832-4777   °[facility] [address], °[place] (336) 271-4999   °[facility]    (336) 373-0678   °[facility]    (336) 272-1050   °[facility] ° [address], [place] Phone:  (336) 832-4444, Fax:  (336) 832-4440 Hours of Operation:  9 am - 6 pm, M-F.  Also accepts Medicaid/Medicare and self-pay.  °[facility] ° [address], [place] Phone: (336) 832-3150, Fax: (336)  832-3151. Hours of Operation:  8:30 am - 5:30 pm, M-F.  Also accepts Medicaid and self-pay.  °[facility] [place] [address], [place] Phone: (336) 878-6027   °[facility] [address], [place],  (336)723-1848, Ext. 123 Mondays & Thursdays: 7-9 AM.  First 15 patients are seen on a first come, first serve basis. °  ° °Medicaid-accepting [place] Providers: ° °Organization         Address  Phone   Notes  °[facility] [address], [place] (336) 641-2100 Also accepts self-pay patients.  °[facility] [address], [place] ° (336) 856-9996   °[facility] [address], [place] (336) 288-8857   °[facility] [address] [place] [address], [place] (336) 299-7000   °Veita Bland [address],   ° (336) 373-1557 Only accepts [place] Access   Medicaid patients after they have their name applied to their card.  ° °Self-Pay (no insurance) in [place]: ° °Organization         Address  Phone   Notes  °Sickle Cell Patients, [facility] [address], [place] (336) 832-1970   °[facility] [address], [place] (336) 832-4400   °[facility] ° [address], [place] (336) 992-4800   °[facility]/Dr. Osei-Bonsu ° [address] [place] [address], [place] or [address], [place] (336) 841-8500 Phone number for both [place] and [place] locations is the same.  °[facility] [address], [place] (336) 299-0000   °[facility] [address] [place] [address], [place] or [address] (336) 852-7530 °(336) 878-2260   °[facility] [address] [place], [place] (336) 350-1642, phone; (336) 294-5005, fax Sees patients 1st and 3rd Saturday of every month.  Must not qualify for public or private insurance (i.e.  Medicaid, Medicare, [place] Health Choice, Veterans' Benefits) • Household income should be no more than 200% of the poverty level •The clinic cannot treat you if you are pregnant or think you are pregnant • Sexually transmitted diseases are not treated at the clinic.  ° ° °Dental Care: °Organization         Address  Phone  Notes  °[facility] [address], [place] (336) 641-6152 Accepts children up to age 21 who are enrolled in Medicaid or [place] Health Choice; pregnant women with a Medicaid card; and children who have applied for Medicaid or [place] Health Choice, but were declined, whose parents can pay a reduced fee at time of service.  °[facility] [place]  [address], [place] (336) 641-7733 Accepts children up to age 21 who are enrolled in Medicaid or [place] Health Choice; pregnant women with a Medicaid card; and children who have applied for Medicaid or [place] Health Choice, but were declined, whose parents can pay a reduced fee at time of service.  °[facility] ° [address], [place] (336) 641-4533 Patients are seen by appointment only. Walk-ins are not accepted. [facility] will see patients 18 years of age and older. °Monday - Tuesday (8am-5pm) °Most Wednesdays (8:30-5pm) °$30 per visit, cash only  °[facility] ° [address], [place] (336) 641-4533 Patients are seen by appointment only. Walk-ins are not accepted. [facility] will see patients 18 years of age and older. °One Wednesday Evening (Monthly: Volunteer Based).  $30 per visit, cash only  °[facility]  (919) 537-3737 for adults; Children under age 4, call Graduate Pediatric Dentistry at (919) [phone]. Children aged 4-14, please call (919) [phone] to request a pediatric application. ° Dental services are provided in all areas of dental care including fillings,  crowns and bridges, complete and partial dentures, implants, gum treatment, root canals, and extractions. Preventive care is also provided. Treatment is provided to both adults and children. °Patients are selected via a lottery and there is often a waiting list. °  °[facility] [address], °[place] ° (336) 763-8833 [email or web address] °  °[facility] [address], [place],  (336)723-1848, Ext. 123 Second and Fourth Thursday of each month, opens at 6:30 AM; Clinic ends at 9 AM.    Patients are seen on a first-come first-served basis, and a limited number are seen during each clinic.  ° °[facility] ° [address], [place], [place] (336) 723-7904   Eligibility Requirements °You must have lived in [place], [place], or [place] counties for at least the last three months. °  You cannot be eligible for state or federal sponsored healthcare insurance, including Veterans Administration, Medicaid, or Medicare. °  You generally cannot be eligible for healthcare insurance through your employer.  °  How to apply: °Eligibility screenings are held every Tuesday and Wednesday afternoon from 1:00 pm until 4:00 pm. You do not need an appointment for the interview!  °[facility] [address], [place], [place] 336-631-2330   °[facility]  336-342-8273   °[facility]  336-703-3100   °[facility]  336-570-6415   ° °Behavioral Health Resources in the Community: °Intensive Outpatient Programs °Organization         Address  Phone  Notes  °[facility] [address], [place], [place] 336-878-6098   °[facility] [address], [place], [place] 336-832-9800   °[facility] [address], [place], [place] ° 336-882-2125   °[facility] [address],  °[place], [place] 1-800-853-5163 or 336-641-4981   °Substance Abuse  Resources °Organization         Address  Phone  Notes  °[facility]  336-882-2125   °[facility]  336-784-9470   °[facility]  336-285-9073   °[facility]  336-845-3988   °Residential & Outpatient Substance Abuse Program  1-800-659-3381   °Psychological Services °Organization         Address  Phone  Notes  °[facility]  336- 832-9600   °[facility]  336- 378-7881   °[facility] [address], [place] 1-800-853-5163 or 336-641-4981   ° °Mobile Crisis Teams °Organization         Address  Phone  Notes  °[facility], Mobile Crisis Care Unit  1-877-626-1772   °Assertive °[facility] ° [address] [place], [place] 336-834-9664   °Sharon DeEsch [address] °[place] 336-554-5454   ° °Self-Help/Support Groups °Organization         Address  Phone             Notes  °[facility] - variety of support groups  336- 373-1402 Call for more information  °Narcotics Anonymous (NA), [facility] [address], °[place]  2 meetings at this location  ° °Residential Treatment Programs °Organization         Address  Phone  Notes  °[facility] [address],    °[place]  1-866-801-8205   °[facility] ° [address], [place], [place] 704-293-8524   °[facility] [address], [place] 336-845-3988 Admissions: 8am-3pm M-F  °[facility] [address]    °[place], [place] 336-841-1104   °[facility] [address], [place], [place] 336-379-7146   °[facility] [address]  °[place], [place] 336-285-9073   °[facility] - Intensive Outpatient [address], , [place] 336-852-3033   °[facility] [address]  °[place],  1-877-615-2722 or 336-784-9470   °[facility] [address] [place], [place] 336-227-7417 Accepts Medicaid  °[facility] [address]  ° [place] 1-800-659-3381 Substance Abuse/Addiction Treatment  ° °[place] Behavioral Health Resources °Organization         Address  Phone  Notes  °[facility]  (888) 581-9988   °Julie Brannon, PhD [address] [place], [place]   (336) 349-5553 or (336) 951-0000   °[facility]   [address] °[place], [place] (336) 349-4454   °[facility] [address], [place], [place] (336) 342-8316 Insurance/Medicaid/sponsorship through [facility]  °[facility] [address]                                    [place], [place] (336) 342-8316 Therapy/tele-psych/case  °[facility] [address]  ° [place],  (336) 349-2233    °Dr. Arfeen  (336) 349-4544   °[facility] [place]  [employer] [place] [facility] 1) [address], [place] °2) [address], [place] °3)  [address], [place] (336) 349-3220 °(336) 342-7768 ° °(336) 342-8140   °[place] [facility] (336) 342-1394 or (336) 342-3537 (After Hours)    ° ° ° °

## 2014-07-21 NOTE — ED Provider Notes (Signed)
CSN: [id number]     Arrival date & time 07/21/14  1036 History   First MD Initiated Contact with Patient 07/21/14 1132     Chief Complaint  Patient presents with  . Back Pain  . Numbness     (Consider location/radiation/quality/duration/timing/severity/associated sxs/prior Treatment) HPI Douglas Wong is a 33 y.o. male with a history of back discomfort he comes in for evaluation of the same back pain. He was seen here August 9 for the same complaint and was diagnosed with muscle strain. He states his pain has been present since March, it is off-and-on. He denies any injury or trauma to his back or legs. He reports periodic numbness in his left hand after he wakes up from sleeping. Numbness typically resolves in a few seconds. He denies any numbness or tingling in his legs. No difficulty urinating or moving his bowels. He does not have a primary care as he just started a new job and is waiting for insurance to kick in. He denies any fevers, difficulty breathing, chest pain, nausea vomiting, new numbness or weakness.  Past Medical History  Diagnosis Date  . Alcoholic fatty liver   . Hypertension    No past surgical history on file. Family History  Problem Relation Age of Onset  . Diabetes Mother   . Cancer Father     liver  . Hypertension Father    History  Substance Use Topics  . Smoking status: Current Some Day Smoker -- 0.30 packs/day    Types: Cigarettes    Last Attempt to Quit: 10/24/2013  . Smokeless tobacco: Not on file  . Alcohol Use: No    Review of Systems  Constitutional: Negative for fever.  Respiratory: Negative for shortness of breath.   Cardiovascular: Negative for chest pain.  Gastrointestinal: Negative for abdominal pain.  Endocrine: Negative for polyuria.  Genitourinary: Negative for flank pain.  Musculoskeletal: Positive for back pain. Negative for neck pain and neck stiffness.  Skin: Negative for wound.  Neurological: Negative for weakness and numbness.       Allergies  Review of patient's allergies indicates no known allergies.  Home Medications   Prior to Admission medications   Medication Sig Start Date End Date Taking? Authorizing Provider  methocarbamol (ROBAXIN) 500 MG tablet Take 500-1,000 mg by mouth 2 (two) times daily as needed for muscle spasms.   Yes Historical Provider, MD  Multiple Vitamin (MULTIVITAMIN WITH MINERALS) TABS tablet Take 1 tablet by mouth every morning.   Yes Historical Provider, MD  Probiotic Product (ALIGN) 4 MG CAPS Take 4 mg by mouth every morning.    Yes Historical Provider, MD  methocarbamol (ROBAXIN) 500 MG tablet Take 1 tablet (500 mg total) by mouth 2 (two) times daily as needed for muscle spasms. 07/21/14   Earle Gell Essynce Munsch, PA-C   BP 146/86  Pulse 66  Temp(Src) 97.8 F (36.6 C) (Oral)  Resp 17  SpO2 100% Physical Exam  Nursing note and vitals reviewed. Constitutional:  Awake, alert, nontoxic appearance with baseline speech.  HENT:  Head: Atraumatic.  Eyes: Pupils are equal, round, and reactive to light. Right eye exhibits no discharge. Left eye exhibits no discharge.  Neck: Neck supple.  Cardiovascular: Normal rate and regular rhythm.   No murmur heard. Pulmonary/Chest: Effort normal and breath sounds normal. No respiratory distress. He has no wheezes. He has no rales. He exhibits no tenderness.  Abdominal: Soft. Bowel sounds are normal. He exhibits no mass. There is no tenderness. There is no  rebound.  Musculoskeletal:       Thoracic back: He exhibits no tenderness.       Lumbar back: He exhibits no tenderness.  Bilateral lower extremities non tender without new rashes or color change, baseline ROM with intact DP / PT pulses, CR<2 secs all digits bilaterally, sensation baseline light touch bilaterally for pt, motor symmetric bilateral 5 / 5 hip flexion, quadriceps, hamstrings, EHL, foot dorsiflexion, foot plantarflexion, gait not antalgic and without apparent ataxia. Negative Tinel's on  left hand. Distal pulses intact. grip strength equal and bilateral.  Neurological:  Mental status baseline for patient.  Upper extremity motor strength and sensation intact and symmetric bilaterally.  Skin: No rash noted.  Psychiatric: He has a normal mood and affect.    ED Course  Procedures (including critical care time) Labs Review Labs Reviewed - No data to display  Imaging Review No results found.   EKG Interpretation None     Meds given in ED:  Medications - No data to display  Discharge Medication List as of 07/21/2014  1:22 PM    START taking these medications   Details  !! methocarbamol (ROBAXIN) 500 MG tablet Take 1 tablet (500 mg total) by mouth 2 (two) times daily as needed for muscle spasms., Starting 07/21/2014, Until Discontinued, Print     !! - Potential duplicate medications found. Please discuss with provider.     Filed Vitals:   07/21/14 1325  BP: 146/86  Pulse: 66  Temp:   Resp: 17    MDM  Vitals stable - WNL -afebrile Pt resting comfortably in ED. able to ambulate independently without discomfort No evidence of new weakness or numbness. No history of trauma or injury. PE clinical picture not concerning for emergent pathology at this time. Discomfort likely due to muscle spasm or strain no bony tenderness of spine. Will DC with Robaxin for symptoms of muscle strain and/or spasm-would like to try a muscle relaxer before pursuing any other treatment. Discussed f/u with PCP and return precautions, pt very amenable to plan.   Final diagnoses:  Muscle strain   Prior to patient discharge, I discussed and reviewed this case with Dr.Belfi        Sharlene Motts, PA-C 07/21/14 1340

## 2014-07-23 ENCOUNTER — Encounter (HOSPITAL_COMMUNITY): Payer: Self-pay | Admitting: Emergency Medicine

## 2014-07-23 ENCOUNTER — Emergency Department (HOSPITAL_COMMUNITY)
Admission: EM | Admit: 2014-07-23 | Discharge: 2014-07-23 | Disposition: A | Discharge status: Home / Self Care | Payer: Self-pay | Attending: Emergency Medicine | Admitting: Emergency Medicine

## 2014-07-23 DIAGNOSIS — F17200 Nicotine dependence, unspecified, uncomplicated: Secondary | ICD-10-CM | POA: Insufficient documentation

## 2014-07-23 DIAGNOSIS — R519 Headache, unspecified: Secondary | ICD-10-CM

## 2014-07-23 DIAGNOSIS — R0602 Shortness of breath: Secondary | ICD-10-CM | POA: Insufficient documentation

## 2014-07-23 DIAGNOSIS — I10 Essential (primary) hypertension: Secondary | ICD-10-CM | POA: Insufficient documentation

## 2014-07-23 DIAGNOSIS — Z79899 Other long term (current) drug therapy: Secondary | ICD-10-CM | POA: Insufficient documentation

## 2014-07-23 DIAGNOSIS — R110 Nausea: Secondary | ICD-10-CM | POA: Insufficient documentation

## 2014-07-23 DIAGNOSIS — R51 Headache: Secondary | ICD-10-CM | POA: Insufficient documentation

## 2014-07-23 DIAGNOSIS — Z8719 Personal history of other diseases of the digestive system: Secondary | ICD-10-CM | POA: Insufficient documentation

## 2014-07-23 DIAGNOSIS — R42 Dizziness and giddiness: Secondary | ICD-10-CM | POA: Insufficient documentation

## 2014-07-23 DIAGNOSIS — R079 Chest pain, unspecified: Secondary | ICD-10-CM | POA: Insufficient documentation

## 2014-07-23 DIAGNOSIS — M549 Dorsalgia, unspecified: Secondary | ICD-10-CM | POA: Insufficient documentation

## 2014-07-23 NOTE — ED Notes (Signed)
Pt here from home with c/o headache and back pain

## 2014-07-23 NOTE — Discharge Instructions (Signed)
°Emergency Department Resource Guide °1) Find a Doctor and Pay Out of Pocket °Although you won't have to find out who is covered by your insurance plan, it is a good idea to ask around and get recommendations. You will then need to call the office and see if the doctor you have chosen will accept you as a new patient and what types of options they offer for patients who are self-pay. Some doctors offer discounts or will set up payment plans for their patients who do not have insurance, but you will need to ask so you aren't surprised when you get to your appointment. ° °2) Contact Your Local Health Department °Not all health departments have doctors that can see patients for sick visits, but many do, so it is worth a call to see if yours does. If you don't know where your local health department is, you can check in your phone book. The CDC also has a tool to help you locate your state's health department, and many state websites also have listings of all of their local health departments. ° °3) Find a Walk-in Clinic °If your illness is not likely to be very severe or complicated, you may want to try a walk in clinic. These are popping up all over the country in pharmacies, drugstores, and shopping centers. They're usually staffed by nurse practitioners or physician assistants that have been trained to treat common illnesses and complaints. They're usually fairly quick and inexpensive. However, if you have serious medical issues or chronic medical problems, these are probably not your best option. ° °No Primary Care Doctor: °- Call [facility] at  832-8000 - they can help you locate a primary care doctor that  accepts your insurance, provides certain services, etc. °- Physician Referral Service- 1-800-533-3463 ° °Chronic Pain Problems: °Organization         Address  Phone   Notes  °[facility]  (336) 297-2271 Patients need to be referred by their primary care doctor.  ° °Medication  Assistance: °Organization         Address  Phone   Notes  °[place] [facility] [address] °[place], [place] [address] (336) 641-8030 --Must be a resident of [place] °-- Must have NO insurance coverage whatsoever (no Medicaid/ Medicare, etc.) °-- The pt. MUST have a primary care doctor that directs their care regularly and follows them in the community °  °[facility]  (866) 331-1348   °[employer]  (888) 892-1162   ° °Agencies that provide inexpensive medical care: °Organization         Address  Phone   Notes  °[facility]  (336) 832-8035   ° Internal Medicine    (336) 832-7272   °[facility] [address] °[place], [place] [address] (336) 832-4777   °[facility] [address], °[place] (336) 271-4999   °[facility]    (336) 373-0678   °[facility]    (336) 272-1050   °[facility] ° [address], [place] Phone:  (336) 832-4444, Fax:  (336) 832-4440 Hours of Operation:  9 am - 6 pm, M-F.  Also accepts Medicaid/Medicare and self-pay.  °[facility] ° [address], [place] Phone: (336) 832-3150, Fax: (336) 832-3151. Hours of Operation:  8:30 am - 5:30 pm, M-F.  Also accepts Medicaid and self-pay.  °[facility] [place] [address], [place] Phone: (336) 878-6027   °[facility] [address], [place], [place] (336)723-1848, Ext. 123 Mondays & Thursdays: 7-9 AM.  First 15 patients are seen on a first come, first serve basis. °  ° °Medicaid-accepting [place] Providers: ° °Organization         Address  Phone   Notes  °[facility] [address], [place] (336) 641-2100 Also accepts self-pay patients.  °[facility] [address], [place] ° (336) 856-9996   °[facility] [address], [place]  (336) 288-8857   °[facility] [address] [place] [address], [place] (336) 299-7000   °Veita Bland [address], [place]  ° (336) 373-1557 Only accepts [place] Access Medicaid patients after they have their name applied to their card.  ° °Self-Pay (no insurance) in [place]: ° °Organization         Address  Phone   Notes  °Sickle Cell Patients, [facility] [address], [place] (336) 832-1970   °[facility] [address], [place] (336) 832-4400   °[facility] ° [address], [place] (336) 992-4800   °[facility]/Dr. Osei-Bonsu ° [address] [place] [address], [place] or [address], [place] (336) 841-8500 Phone number for both [place] and [place] locations is the same.  °[facility] [address], [place] (336) 299-0000   °[facility] [address] [place] [address], [place] or [address] (336) 852-7530 °(336) 878-2260   °[facility] [address], [place] (336) 350-1642, phone; (336) 294-5005, fax Sees patients 1st and 3rd Saturday of every month.  Must not qualify for public or private insurance (i.e. Medicaid, Medicare, [place] Health Choice, Veterans' Benefits) • Household income should be no more than 200% of the poverty level •The clinic cannot treat you if you are pregnant or think you are pregnant • Sexually transmitted diseases are not treated at the clinic.  ° ° °Dental Care: °Organization         Address  Phone  Notes  °[place] [facility] [address], [place] (336) 641-6152 Accepts children up to age 21 who are enrolled in Medicaid or [place] Health Choice; pregnant women with a Medicaid card; and children who have applied for Medicaid or [place] Health Choice, but were declined, whose parents can pay a reduced fee at time of service.  °[place]  [facility] [place]  [address], [place] (336) 641-7733 Accepts children up to age 21 who are enrolled in Medicaid or [place] Health Choice; pregnant women with a Medicaid card; and children who have applied for Medicaid or  Health Choice, but were declined, whose parents can pay a reduced fee at time of service.  °[facility] ° [address],  (336) 641-4533 Patients are seen by appointment only. Walk-ins are not accepted. [facility] will see patients 18 years of age and older. °Monday - Tuesday (8am-5pm) °Most Wednesdays (8:30-5pm) °$30 per visit, cash only  °[facility] ° [address], [place] (336) 641-4533 Patients are seen by appointment only. Walk-ins are not accepted. [facility] will see patients 18 years of age and older. °One   Wednesday Evening (Monthly: Volunteer Based).  $30 per visit, cash only  °[facility]  (919) 537-3737 for adults; Children under age 4, call Graduate Pediatric Dentistry at (919) [phone]. Children aged 4-14, please call (919) 537-3737 to request a pediatric application. ° Dental services are provided in all areas of dental care including fillings, crowns and bridges, complete and partial dentures, implants, gum treatment, root canals, and extractions. Preventive care is also provided. Treatment is provided to both adults and children. °Patients are selected via a lottery and there is often a waiting list. °  °[facility] [address], °[place] ° (336) 763-8833 [email or web address] °  °[facility] [address], [place], [place] (336)723-1848, Ext. 123 Second and Fourth Thursday of each month, opens at 6:30 AM; Clinic ends at 9 AM.  Patients are seen on a first-come first-served basis, and a limited number are seen during each clinic.  ° °[facility] ° [address], [place], [place] (336) 723-7904    Eligibility Requirements °You must have lived in [place], [place], or [place] counties for at least the last three months. °  You cannot be eligible for state or federal sponsored healthcare insurance, including Veterans Administration, Medicaid, or Medicare. °  You generally cannot be eligible for healthcare insurance through your employer.  °  How to apply: °Eligibility screenings are held every Tuesday and Wednesday afternoon from 1:00 pm until 4:00 pm. You do not need an appointment for the interview!  °[facility] [address], [place], [place] 336-631-2330   °[facility]  336-342-8273   °[facility]  336-703-3100   °[facility]  336-570-6415   ° °Behavioral Health Resources in the Community: °Intensive Outpatient Programs °Organization         Address  Phone  Notes  °[facility] [address], [place], [place] 336-878-6098   °[facility] [address], [place], [place] 336-832-9800   °[facility] [address], [place], [place] ° 336-882-2125   °[facility] [address],  °[place], [place] 1-800-853-5163 or 336-641-4981   °Substance Abuse Resources °Organization         Address  Phone  Notes  °[facility]  336-882-2125   °[facility]  336-784-9470   °[facility]  336-285-9073   °[facility]  336-845-3988   °Residential & Outpatient Substance Abuse Program  1-800-659-3381   °Psychological Services °Organization         Address  Phone  Notes  °[facility]  336- 832-9600   °[facility]  336- 378-7881   °[facility] [address], [place] 1-800-853-5163 or 336-641-4981   ° °Mobile Crisis Teams °Organization         Address  Phone  Notes  °[facility], Mobile Crisis Care Unit  1-877-626-1772   °Assertive °[facility] ° [address]  [place], [place] 336-834-9664   °Sharon DeEsch [address] °[place] 336-554-5454   ° °Self-Help/Support Groups °Organization         Address  Phone             Notes  °[facility] - variety of support groups  336- 373-1402 Call for more information  °Narcotics Anonymous (NA), [facility] [address], °[place]   2 meetings at this location  ° °  Residential Treatment Programs °Organization         Address  Phone  Notes  °[facility] [address],    °[place]  1-866-801-8205   °[facility] ° [address], [place], [place] 704-293-8524   °[facility] [address], [place] 336-845-3988 Admissions: 8am-3pm M-F  °[facility] [address]    °[place], [place] 336-841-1104   °[facility] [address], [place], [place] 336-379-7146   °[facility] [address]  °[place], [place] 336-285-9073   °[facility] - Intensive Outpatient [address], [place], [place] 336-852-3033   °[facility] [address]  °[place], [place] 1-877-615-2722 or 336-784-9470   °[facility] [address] [place], [place] 336-227-7417 Accepts Medicaid  °[facility] [address]  °[place] 1-800-659-3381 Substance Abuse/Addiction Treatment  ° °[place] Behavioral Health Resources °Organization         Address  Phone  Notes  °[facility]  (888) 581-9988   °Julie Brannon, PhD [address] [place], [place]   (336) 349-5553 or (336) 951-0000   °[facility]   [address] °[place], [place] (336) 349-4454   °[facility] [address], [place], [place] (336) 342-8316 Insurance/Medicaid/sponsorship through [facility]  °[facility] [address]                                    [place], [place] (336) 342-8316 Therapy/tele-psych/case    °[facility] [address]  ° [place], [place] (336) 349-2233    °Dr. Arfeen  (336) 349-4544   °[facility] [place]  [employer] [place] [facility] 1) [address], [place] °2) [address], [place] °3)  [address], [place] (336) 349-3220 °(336) 342-7768 ° °(336) 342-8140   °[place] [facility] (336) 342-1394 or (336) 342-3537 (After Hours)    ° ° °

## 2014-07-23 NOTE — ED Provider Notes (Signed)
CSN: [id number]     Arrival date & time 07/23/14  4098 History   First MD Initiated Contact with Patient 07/23/14 [phone]     Chief Complaint  Patient presents with  . Migraine     (Consider location/radiation/quality/duration/timing/severity/associated sxs/prior Treatment) Patient is a 33 y.o. male presenting with migraines.  Migraine Associated symptoms include chest pain, headaches, nausea and numbness. Pertinent negatives include no abdominal pain, diaphoresis, fever, vomiting or weakness.   Pt is a 33 y/o male w/ PMHx of HTN who presents to the ED for HA's and eye twitching. This has been going on for 1 week and occurs about 3 time per day. On further clarification, HA's are more like foggy episodes where he has difficulty concentrating. He denies LOC during these episodes and they self resolve within a few hours. He states he has difficulty at work due to the foggy episodes. He is also complaining of dizziness, nausea, sensitivity to light and watery eyes x 1 week. He reports current pain as 7/10 in severity located under his right eye. Sleep does not help with HA's, and he tried excedrin 2 days ago w/o much relief in HA's. He states he has had chest pain that starts on left side of his chest and radiates down his back and to his right arm with right arm numbness. This is not new for him and he has been seen in the past for this. He was also seen in the ED on 07/21/14 and reports that when compared to that visit nothing has changed today.   Past Medical History  Diagnosis Date  . Alcoholic fatty liver   . Hypertension    History reviewed. No pertinent past surgical history. Family History  Problem Relation Age of Onset  . Diabetes Mother   . Cancer Father     liver  . Hypertension Father    History  Substance Use Topics  . Smoking status: Current Some Day Smoker -- 0.30 packs/day    Types: Cigarettes    Last Attempt to Quit: 10/24/2013  . Smokeless tobacco: Not on file  . Alcohol  Use: No    Review of Systems  Constitutional: Negative for fever and diaphoresis.  Eyes: Positive for photophobia and pain (right eye).  Respiratory: Positive for shortness of breath (occassionally w/ HA's).   Cardiovascular: Positive for chest pain.  Gastrointestinal: Positive for nausea. Negative for vomiting, abdominal pain and diarrhea.  Musculoskeletal: Positive for back pain.  Neurological: Positive for dizziness, numbness and headaches. Negative for weakness.      Allergies  Review of patient's allergies indicates no known allergies.  Home Medications   Prior to Admission medications   Medication Sig Start Date End Date Taking? Authorizing Provider  methocarbamol (ROBAXIN) 500 MG tablet Take 1 tablet (500 mg total) by mouth 2 (two) times daily as needed for muscle spasms. 07/21/14  Yes Earle Gell Cartner, PA-C  Multiple Vitamin (MULTIVITAMIN WITH MINERALS) TABS tablet Take 1 tablet by mouth every morning.   Yes Historical Provider, MD  Probiotic Product (ALIGN) 4 MG CAPS Take 4 mg by mouth every morning.    Yes Historical Provider, MD   BP 115/77  Pulse 78  Temp(Src) 98.3 F (36.8 C) (Oral)  Resp 20  SpO2 15% Physical Exam  Constitutional: He appears well-developed and well-nourished. No distress.  HENT:  Head: Normocephalic and atraumatic.  Mouth/Throat: Oropharynx is clear and moist.  Tenderness to palpation of rt maxillary sinus  Eyes: Conjunctivae and EOM are normal. Pupils  are equal, round, and reactive to light.  Cardiovascular: Normal rate and regular rhythm.   No murmur heard. Pulmonary/Chest: Effort normal and breath sounds normal. He has no wheezes. He exhibits no tenderness.  Abdominal: Soft. Bowel sounds are normal. There is no tenderness.  Neurological: No cranial nerve deficit.  Upper extremity and lower extremity muscle strength equal    ED Course  Procedures (including critical care time)  EKG unchanged from previous EKG on 02/02/14, ST elevation  2/2 repolarization changes. MDM   Final diagnoses:  Headache around the eyes    Pt initially reported having a migraine associated w/ dizziness, twitching eyes, and nausea. He rated pain as 7/10 in severity. On further questioning pt is not suffering from migraines and he describes episodes as more of difficulty w/ concentrating. He was last seen in the ED 2 days ago and reports nothing new or acute has occurred since then. He states that he has not felt like himself in a while. Patient advised to get a PCP for healthcare management. Will get an EKG today to evaluate for chest pain that patient reported.   EKG unchanged from previous EKG on 02/02/14.  Will d/c patient home. Given list of community resources for healthcare.         Gara Kroner, MD 07/23/14 1047  Gara Kroner, MD 07/23/14 1050

## 2014-07-28 NOTE — ED Provider Notes (Signed)
I saw and evaluated the patient, reviewed the resident's note and I agree with the findings and plan.   EKG Interpretation   Date/Time:  Tuesday July 23 2014 10:15:00 EDT Ventricular Rate:  69 PR Interval:  161 QRS Duration: 91 QT Interval:  363 QTC Calculation: 389 R Axis:   42 Text Interpretation:  Sinus rhythm RSR' in V1 or V2, probably normal  variant LVH by voltage ST elev, probable normal early repol pattern ED  PHYSICIAN INTERPRETATION AVAILABLE IN [facility] Confirmed by TEST,  Record ([id number]) on 07/25/2014 7:20:57 AM      33 year old male with headache. Patient has a past history of what he calls migraines. Current symptoms feel similar to previous headaches. Most recently started 3 days ago. Gradual onset. Describes the headache is diffuse and achy. Doesn't really lateralize. Denies any visual complaints. No fevers or chills. No neck pain or neck stiffness. Denies any trauma. No blood thinners. Has tried using Excedrin Migraine without significant relief. On exam he is in NAD. Neck supple. Neuro exam non-focal. Appears well. Suspect some component of anxiety. Very low suspicion for emergent cause of HA or other symptoms.     Raeford Razor, MD 07/28/14 [phone]

## 2014-07-31 ENCOUNTER — Ambulatory Visit: Payer: Self-pay | Attending: Internal Medicine | Admitting: Internal Medicine

## 2014-07-31 ENCOUNTER — Encounter: Payer: Self-pay | Admitting: Internal Medicine

## 2014-07-31 VITALS — BP 140/90 | HR 76 | Temp 98.0°F | Resp 16 | Wt 204.8 lb

## 2014-07-31 DIAGNOSIS — IMO0001 Reserved for inherently not codable concepts without codable children: Secondary | ICD-10-CM

## 2014-07-31 DIAGNOSIS — Z139 Encounter for screening, unspecified: Secondary | ICD-10-CM

## 2014-07-31 DIAGNOSIS — H538 Other visual disturbances: Secondary | ICD-10-CM

## 2014-07-31 DIAGNOSIS — G245 Blepharospasm: Secondary | ICD-10-CM

## 2014-07-31 DIAGNOSIS — F17200 Nicotine dependence, unspecified, uncomplicated: Secondary | ICD-10-CM | POA: Insufficient documentation

## 2014-07-31 DIAGNOSIS — R030 Elevated blood-pressure reading, without diagnosis of hypertension: Secondary | ICD-10-CM

## 2014-07-31 DIAGNOSIS — I10 Essential (primary) hypertension: Secondary | ICD-10-CM | POA: Insufficient documentation

## 2014-07-31 DIAGNOSIS — R259 Unspecified abnormal involuntary movements: Secondary | ICD-10-CM | POA: Insufficient documentation

## 2014-07-31 LAB — CBC WITH DIFFERENTIAL/PLATELET
Basophils Absolute: 0 10*3/uL (ref 0.0–0.1)
Basophils Relative: 0 % (ref 0–1)
EOS PCT: 1 % (ref 0–5)
Eosinophils Absolute: 0 10*3/uL (ref 0.0–0.7)
HCT: 41.9 % (ref 39.0–52.0)
Hemoglobin: 15 g/dL (ref 13.0–17.0)
LYMPHS ABS: 2.1 10*3/uL (ref 0.7–4.0)
LYMPHS PCT: 44 % (ref 12–46)
MCH: 30 pg (ref 26.0–34.0)
MCHC: 35.8 g/dL (ref 30.0–36.0)
MCV: 83.8 fL (ref 78.0–100.0)
Monocytes Absolute: 0.4 10*3/uL (ref 0.1–1.0)
Monocytes Relative: 8 % (ref 3–12)
Neutro Abs: 2.2 10*3/uL (ref 1.7–7.7)
Neutrophils Relative %: 47 % (ref 43–77)
Platelets: 215 10*3/uL (ref 150–400)
RBC: 5 MIL/uL (ref 4.22–5.81)
RDW: 14.7 % (ref 11.5–15.5)
WBC: 4.7 10*3/uL (ref 4.0–10.5)

## 2014-07-31 NOTE — Progress Notes (Signed)
Patient here to establish care Complains of back pain Has been having headaches the past two weeks with some Eye twitching

## 2014-07-31 NOTE — Progress Notes (Signed)
Patient Demographics  Douglas Wong, is a 33 y.o. male  RUE:[id number]  JYN:[id number]  DOB - 06-04-1981  CC:  Chief Complaint  Patient presents with  . Establish Care       HPI: Douglas Wong is a 33 y.o. male here today to establish medical care. Patient has recently went to the emergency room with symptoms of blurry vision headache eye twitching , currently patient only takes multivitamins and probiotic in the past he was prescribed muscle relaxant which is not taking any more currently denies any headache dizziness chest pain or shortness of breath but does complaining of blurry vision and sometimes floaters. Patient denies any family history of migraine headaches. His blood pressure is borderline elevated today, he does report family history of hypertension. Patient has No headache, No chest pain, No abdominal pain - No Nausea, No new weakness tingling or numbness, No Cough - SOB.  No Known Allergies Past Medical History  Diagnosis Date  . Alcoholic fatty liver   . Hypertension    Current Outpatient Prescriptions on File Prior to Visit  Medication Sig Dispense Refill  . methocarbamol (ROBAXIN) 500 MG tablet Take 1 tablet (500 mg total) by mouth 2 (two) times daily as needed for muscle spasms.  20 tablet  0  . Multiple Vitamin (MULTIVITAMIN WITH MINERALS) TABS tablet Take 1 tablet by mouth every morning.      . Probiotic Product (ALIGN) 4 MG CAPS Take 4 mg by mouth every morning.        No current facility-administered medications on file prior to visit.   Family History  Problem Relation Age of Onset  . Diabetes Mother   . Hypertension Mother   . Cancer Father     liver  . Hypertension Father   . Cancer Paternal Uncle     coon cancer    History   Social History  . Marital Status: Married    Spouse Name: N/A    Number of Children: N/A  . Years of Education: N/A   Occupational History  . Not on file.   Social History Main Topics  . Smoking status: Current  Some Day Smoker -- 0.30 packs/day for 14 years    Types: Cigarettes    Last Attempt to Quit: 10/24/2013  . Smokeless tobacco: Not on file  . Alcohol Use: No  . Drug Use: No  . Sexual Activity: Not on file   Other Topics Concern  . Not on file   Social History Narrative  . No narrative on file    Review of Systems: Constitutional: Negative for fever, chills, diaphoresis, activity change, appetite change and fatigue. HENT: Negative for ear pain, nosebleeds, congestion, facial swelling, rhinorrhea, neck pain, neck stiffness and ear discharge.  Eyes: Negative for pain, discharge, redness, itching and visual disturbance. Respiratory: Negative for cough, choking, chest tightness, shortness of breath, wheezing and stridor.  Cardiovascular: Negative for chest pain, palpitations and leg swelling. Gastrointestinal: Negative for abdominal distention. Genitourinary: Negative for dysuria, urgency, frequency, hematuria, flank pain, decreased urine volume, difficulty urinating and dyspareunia.  Musculoskeletal: Negative for back pain, joint swelling, arthralgia and gait problem. Neurological: Negative for dizziness, tremors, seizures, syncope, facial asymmetry, speech difficulty, weakness, light-headedness, numbness and headaches.  Hematological: Negative for adenopathy. Does not bruise/bleed easily. Psychiatric/Behavioral: Negative for hallucinations, behavioral problems, confusion, dysphoric mood, decreased concentration and agitation.    Objective:   Filed Vitals:   07/31/14 1554  BP: 140/90  Pulse:   Temp:   Resp:  Physical Exam: Constitutional: Patient appears well-developed and well-nourished. No distress. HENT: Normocephalic, atraumatic, External right and left ear normal. Oropharynx is clear and moist.  Eyes: Conjunctivae and EOM are normal. PERRLA, no scleral icterus. Neck: Normal ROM. Neck supple. No JVD. No tracheal deviation. No thyromegaly. CVS: RRR, S1/S2 +, no murmurs,  no gallops, no carotid bruit.  Pulmonary: Effort and breath sounds normal, no stridor, rhonchi, wheezes, rales.  Abdominal: Soft. BS +, no distension, tenderness, rebound or guarding.  Musculoskeletal: Normal range of motion. No edema and no tenderness.  Neuro: Alert. Normal reflexes, muscle tone coordination. No cranial nerve deficit. Skin: Skin is warm and dry. No rash noted. Not diaphoretic. No erythema. No pallor. Psychiatric: Normal mood and affect. Behavior, judgment, thought content normal.  Lab Results  Component Value Date   WBC 6.0 02/02/2014   HGB 13.2 02/02/2014   HCT 37.8* 02/02/2014   MCV 86.3 02/02/2014   PLT 213 02/02/2014   Lab Results  Component Value Date   CREATININE 0.95 02/02/2014   BUN 16 02/02/2014   NA 139 02/02/2014   K 3.6* 02/02/2014   CL 97 02/02/2014   CO2 27 02/02/2014    No results found for this basename: HGBA1C   Lipid Panel  No results found for this basename: chol, trig, hdl, cholhdl, vldl, ldlcalc       Assessment and plan:   1. Eye twitch/2. Blurry vision - Ambulatory referral to Ophthalmology  3. Elevated BP Advised patient for DASH diet.  4. Screening Ordered baseline blood work. - CBC with Differential - COMPLETE METABOLIC PANEL WITH GFR - TSH - Vit D  25 hydroxy (rtn osteoporosis monitoring) - Hemoglobin A1c   Return in about 3 months (around 10/30/2014) for elevated BP.   Doris Cheadle, MD

## 2014-08-01 ENCOUNTER — Telehealth: Payer: Self-pay | Admitting: *Deleted

## 2014-08-01 ENCOUNTER — Other Ambulatory Visit: Payer: Self-pay | Admitting: Internal Medicine

## 2014-08-01 ENCOUNTER — Telehealth: Payer: Self-pay | Admitting: Internal Medicine

## 2014-08-01 DIAGNOSIS — R740 Nonspecific elevation of levels of transaminase and lactic acid dehydrogenase [LDH]: Principal | ICD-10-CM

## 2014-08-01 DIAGNOSIS — R7401 Elevation of levels of liver transaminase levels: Secondary | ICD-10-CM

## 2014-08-01 LAB — HEMOGLOBIN A1C
Hgb A1c MFr Bld: 5.2 % (ref <5.7)
Mean Plasma Glucose: 103 mg/dL (ref <117)

## 2014-08-01 LAB — VITAMIN D 25 HYDROXY (VIT D DEFICIENCY, FRACTURES): VIT D 25 HYDROXY: 17 ng/mL — AB (ref 30–89)

## 2014-08-01 LAB — COMPLETE METABOLIC PANEL WITH GFR
ALT: 79 U/L — ABNORMAL HIGH (ref 0–53)
AST: 36 U/L (ref 0–37)
Albumin: 4.9 g/dL (ref 3.5–5.2)
Alkaline Phosphatase: 78 U/L (ref 39–117)
BUN: 11 mg/dL (ref 6–23)
CALCIUM: 10 mg/dL (ref 8.4–10.5)
CHLORIDE: 100 meq/L (ref 96–112)
CO2: 31 meq/L (ref 19–32)
CREATININE: 0.94 mg/dL (ref 0.50–1.35)
GFR, Est Non African American: >89 mL/min
Glucose, Bld: 84 mg/dL (ref 70–99)
Potassium: 4.2 mEq/L (ref 3.5–5.3)
Sodium: 139 mEq/L (ref 135–145)
Total Bilirubin: 0.4 mg/dL (ref 0.2–1.2)
Total Protein: 7.6 g/dL (ref 6.0–8.3)

## 2014-08-01 LAB — TSH: TSH: 1.51 u[IU]/mL (ref 0.350–4.500)

## 2014-08-01 MED ORDER — VITAMIN D (ERGOCALCIFEROL) 1.25 MG (50000 UNIT) PO CAPS: 50000.0000 [IU] | ORAL_CAPSULE | ORAL | Status: DC

## 2014-08-01 NOTE — Telephone Encounter (Signed)
Pt is aware of his lab results. He will pick up his medication at [facility]

## 2014-08-01 NOTE — Telephone Encounter (Signed)
Pt received bloodwork results via Mychart and was calling for clarification. Please f/u with pt.

## 2014-08-05 ENCOUNTER — Ambulatory Visit: Payer: Self-pay | Attending: Internal Medicine

## 2014-08-05 ENCOUNTER — Telehealth: Payer: Self-pay | Admitting: Emergency Medicine

## 2014-08-05 NOTE — Progress Notes (Signed)
Pt given lab results with medication instructions. Pt picked script up at pharmacy Scheduled blood work visit 08/06/14 @ 10am

## 2014-08-05 NOTE — Telephone Encounter (Signed)
Message copied by Darlis Loan on Mon Aug 05, 2014 12:46 PM ------      Message from: Doris Cheadle      Created: Thu Aug 01, 2014  9:23 AM       Blood work reviewed, noticed low vitamin D, call patient advise to start ergocalciferol 50,000 units once a week for the duration of  12 weeks.      Also noticed abnormal LFTs, advise patient to avoid alcohol and Tylenol, I have ordered hepatitis panel advised patient to do blood work in a week time. ------

## 2014-08-06 ENCOUNTER — Telehealth: Payer: Self-pay

## 2014-08-06 ENCOUNTER — Other Ambulatory Visit: Payer: Self-pay

## 2014-08-06 DIAGNOSIS — R768 Other specified abnormal immunological findings in serum: Secondary | ICD-10-CM

## 2014-08-06 LAB — HEPATITIS B SURFACE ANTIBODY,QUALITATIVE: Hep B S Ab: POSITIVE — AB

## 2014-08-06 LAB — HEPATITIS B SURFACE ANTIGEN: Hepatitis B Surface Ag: NEGATIVE

## 2014-08-06 LAB — HEPATITIS C ANTIBODY: HCV Ab: REACTIVE — AB

## 2014-08-06 NOTE — Telephone Encounter (Signed)
Patient called to inquiry about his test results Patient is aware and referral placed in Epic for ID

## 2014-08-07 ENCOUNTER — Telehealth: Payer: Self-pay | Admitting: Internal Medicine

## 2014-08-07 NOTE — Telephone Encounter (Signed)
Patient is concerned about his Lab test results about Hepatitis C. Please f/u with Patient.

## 2014-08-08 ENCOUNTER — Telehealth: Payer: Self-pay | Admitting: Internal Medicine

## 2014-08-08 NOTE — Telephone Encounter (Signed)
Pt calling back because he received a call from a nurse. He mentioned that if he does not answer to please email him if possible because his vm is not working -- [email or web address].

## 2014-08-09 ENCOUNTER — Telehealth: Payer: Self-pay | Admitting: Emergency Medicine

## 2014-08-09 NOTE — Telephone Encounter (Signed)
Patient returning nurse call in regards to lab results. Transferred to nurse line. Please follow up.

## 2014-08-09 NOTE — Telephone Encounter (Signed)
Attempted to reach pt;no VM set up I will email pt with results

## 2014-08-12 ENCOUNTER — Telehealth: Payer: Self-pay | Admitting: Emergency Medicine

## 2014-08-12 NOTE — Telephone Encounter (Signed)
Pt given lab results with referral information. Pt states he appt this Thursday. Pt also c/o dizziness with feeling faint with standing. Pt given nurse appt tomorrow

## 2014-08-15 ENCOUNTER — Other Ambulatory Visit (INDEPENDENT_AMBULATORY_CARE_PROVIDER_SITE_OTHER): Payer: Self-pay

## 2014-08-15 DIAGNOSIS — B182 Chronic viral hepatitis C: Secondary | ICD-10-CM

## 2014-08-15 LAB — IRON: Iron: 101 ug/dL (ref 42–165)

## 2014-08-16 LAB — PROTIME-INR
INR: 0.94 (ref <1.50)
PROTHROMBIN TIME: 12.6 s (ref 11.6–15.2)

## 2014-08-16 LAB — HEPATITIS A ANTIBODY, TOTAL: Hep A Total Ab: NONREACTIVE

## 2014-08-16 LAB — HIV ANTIBODY (ROUTINE TESTING W REFLEX): HIV 1&2 Ab, 4th Generation: NONREACTIVE

## 2014-08-16 LAB — HEPATITIS B CORE ANTIBODY, TOTAL: HEP B C TOTAL AB: NONREACTIVE

## 2014-08-16 LAB — ANA: ANA: NEGATIVE

## 2014-08-20 LAB — HEPATITIS C RNA QUANTITATIVE: HCV QUANT: NOT DETECTED [IU]/mL (ref <15)

## 2014-08-20 LAB — HEPATITIS C GENOTYPE

## 2014-08-21 ENCOUNTER — Telehealth: Payer: Self-pay | Admitting: *Deleted

## 2014-08-21 NOTE — Telephone Encounter (Signed)
Called patient to let him know that he is negative for active hepatitis C.  He does not need to follow up with [facility] for Hep C.  Answered the patient's questions to his satisfaction.  Will cc his referring provider. Andree CossHowell, Kimoni Pagliarulo M, RN

## 2014-08-27 ENCOUNTER — Encounter (HOSPITAL_COMMUNITY): Payer: Self-pay | Admitting: Emergency Medicine

## 2014-08-27 ENCOUNTER — Emergency Department (INDEPENDENT_AMBULATORY_CARE_PROVIDER_SITE_OTHER)
Admission: EM | Admit: 2014-08-27 | Discharge: 2014-08-27 | Disposition: A | Discharge status: Home / Self Care | Payer: Self-pay | Source: Home / Self Care

## 2014-08-27 DIAGNOSIS — R519 Headache, unspecified: Secondary | ICD-10-CM

## 2014-08-27 DIAGNOSIS — R51 Headache: Secondary | ICD-10-CM

## 2014-08-27 DIAGNOSIS — F958 Other tic disorders: Secondary | ICD-10-CM

## 2014-08-27 DIAGNOSIS — F959 Tic disorder, unspecified: Secondary | ICD-10-CM

## 2014-08-27 NOTE — Discharge Instructions (Signed)
General Headache Without Cause  A general headache is pain or discomfort felt around the head or neck area. The cause may not be found.   HOME CARE   · Keep all doctor visits.  · Only take medicines as told by your doctor.  · Lie down in a dark, quiet room when you have a headache.  · Keep a journal to find out if certain things bring on headaches. For example, write down:  ¨ What you eat and drink.  ¨ How much sleep you get.  ¨ Any change to your diet or medicines.  · Relax by getting a massage or doing other relaxing activities.  · Put ice or heat packs on the head and neck area as told by your doctor.  · Lessen stress.  · Sit up straight. Do not tighten (tense) your muscles.  · Quit smoking if you smoke.  · Lessen how much alcohol you drink.  · Lessen how much caffeine you drink, or stop drinking caffeine.  · Eat and sleep on a regular schedule.  · Get 7 to 9 hours of sleep, or as told by your doctor.  · Keep lights dim if bright lights bother you or make your headaches worse.  GET HELP RIGHT AWAY IF:   · Your headache becomes really bad.  · You have a fever.  · You have a stiff neck.  · You have trouble seeing.  · Your muscles are weak, or you lose muscle control.  · You lose your balance or have trouble walking.  · You feel like you will pass out (faint), or you pass out.  · You have really bad symptoms that are different than your first symptoms.  · You have problems with the medicines given to you by your doctor.  · Your medicines do not work.  · Your headache feels different than the other headaches.  · You feel sick to your stomach (nauseous) or throw up (vomit).  MAKE SURE YOU:   · Understand these instructions.  · Will watch your condition.  · Will get help right away if you are not doing well or get worse.  Document Released: 08/10/2008 Document Revised: 01/24/2012 Document Reviewed: 10/22/2011  ExitCare® Patient Information ©2015 Exit[employer], LLC. This information is not intended to replace advice given to  you by your health care provider. Make sure you discuss any questions you have with your health care provider.

## 2014-08-27 NOTE — ED Provider Notes (Signed)
CSN: [id number]     Arrival date & time 08/27/14  1221 History   First MD Initiated Contact with Patient 08/27/14 1242     Chief Complaint  Patient presents with  . Headache  . Spasms   (Consider location/radiation/quality/duration/timing/severity/associated sxs/prior Treatment) HPI Comments: 33 year old male complaining the right twitching and twitching in the left face. Also complaining of a headache for which he describes as a mild pressure between the inner canthus of the right and the nasal bone. Denies pain to the floor he had, occiput or temporal parietal areas. He also states that he occasionally sees floaters in his in every day as transient lightheadedness. The symptoms have been occurring for the past several months according to past medical visits and documentation this and the ED and communicate the wellness. There have been no changes. No syncope. No other neurologic/focal abnormalities.    Past Medical History  Diagnosis Date  . Alcoholic fatty liver   . Hypertension    History reviewed. No pertinent past surgical history. Family History  Problem Relation Age of Onset  . Diabetes Mother   . Hypertension Mother   . Cancer Father     liver  . Hypertension Father   . Cancer Paternal Uncle     coon cancer    History  Substance Use Topics  . Smoking status: Current Some Day Smoker -- 1.00 packs/day for 14 years    Types: Cigarettes    Last Attempt to Quit: 10/24/2013  . Smokeless tobacco: Not on file  . Alcohol Use: No    Review of Systems  Constitutional: Negative.  Negative for fever.  HENT: Negative for dental problem, mouth sores, nosebleeds, sneezing, sore throat, tinnitus, trouble swallowing and voice change.   Eyes: Negative for pain, itching and visual disturbance.       "Twitching of the right lower eye lid, sometimes to the L face"  Respiratory: Negative.   Cardiovascular: Negative.   Gastrointestinal: Negative.   Skin: Negative.   Neurological:  Positive for light-headedness and headaches. Negative for dizziness, tremors, seizures, syncope, speech difficulty and weakness.       Chronic numbness to the left hand, particularly after awakening from sleep. Has been discussed with Health Care Providers at other facilities in previous visits.    Allergies  Review of patient's allergies indicates no known allergies.  Home Medications   Prior to Admission medications   Medication Sig Start Date End Date Taking? Authorizing Provider  methocarbamol (ROBAXIN) 500 MG tablet Take 1 tablet (500 mg total) by mouth 2 (two) times daily as needed for muscle spasms. 07/21/14  Yes Earle GellBenjamin W Cartner, PA-C  Multiple Vitamin (MULTIVITAMIN WITH MINERALS) TABS tablet Take 1 tablet by mouth every morning.   Yes Historical Provider, MD  Probiotic Product (ALIGN) 4 MG CAPS Take 4 mg by mouth every morning.    Yes Historical Provider, MD  Vitamin D, Ergocalciferol, (DRISDOL) 50000 UNITS CAPS capsule Take 1 capsule (50,000 Units total) by mouth every 7 (seven) days. 08/01/14  Yes Olugbemiga E Hyman HopesJegede, MD   BP 147/87  Pulse 87  Temp(Src) 98.5 F (36.9 C) (Oral)  Resp 16  SpO2 96% Physical Exam  Nursing note and vitals reviewed. Constitutional: He is oriented to person, place, and time. He appears well-developed and well-nourished. No distress.  HENT:  Head: Normocephalic and atraumatic.  Mouth/Throat: Oropharynx is clear and moist. No oropharyngeal exudate.  Eyes: Conjunctivae and EOM are normal. Pupils are equal, round, and reactive to light.  Right  lower lid tic is observed by examiner  Neck: Normal range of motion. Neck supple.  Cardiovascular: Normal rate, regular rhythm and normal heart sounds.   Pulmonary/Chest: Effort normal and breath sounds normal. No respiratory distress. He has no wheezes.  Musculoskeletal: Normal range of motion. He exhibits no edema and no tenderness.  Lymphadenopathy:    He has no cervical adenopathy.  Neurological: He is  alert and oriented to person, place, and time. No cranial nerve deficit or sensory deficit. He exhibits normal muscle tone. Coordination and gait normal.  Reflex Scores:      Patellar reflexes are 0 on the right side and 1+ on the left side. Skin: Skin is warm and dry. No rash noted.  Psychiatric: He has a normal mood and affect.    ED Course  Procedures (including critical care time) Labs Review Labs Reviewed - No data to display  Imaging Review No results found.   MDM   1. Motor tic disorder   2. Nonintractable headache, unspecified chronicity pattern, unspecified headache type     Atypical headache as inHPI On line education materials re: tic disorders and Twitches. Possibly due to anxiety issues. [facility] May need neuro f/u or trial of meds, possibly anti seizure type or benzo's or muscle relaxants. Exam is stable today.     Hayden Rasmussenavid Donn Wilmot, NP 08/27/14 1315

## 2014-08-27 NOTE — ED Notes (Signed)
Pt states that he has had headaches and muscle twitches for 3 weeks. He is seeing floater.

## 2014-08-28 NOTE — ED Provider Notes (Signed)
Medical screening examination/treatment/procedure(s) were performed by a resident physician or non-physician practitioner and as the supervising physician I was immediately available for consultation/collaboration.  Bridgitte Felicetti, MD Family Medicine   Nara Paternoster J Adysen Raphael, MD 08/28/14 1806 

## 2014-08-30 ENCOUNTER — Encounter (HOSPITAL_COMMUNITY): Payer: Self-pay | Admitting: Emergency Medicine

## 2014-08-30 ENCOUNTER — Emergency Department (HOSPITAL_COMMUNITY)
Admission: EM | Admit: 2014-08-30 | Discharge: 2014-08-30 | Disposition: A | Discharge status: Home / Self Care | Payer: Self-pay | Attending: Emergency Medicine | Admitting: Emergency Medicine

## 2014-08-30 DIAGNOSIS — Z8719 Personal history of other diseases of the digestive system: Secondary | ICD-10-CM | POA: Insufficient documentation

## 2014-08-30 DIAGNOSIS — Z720 Tobacco use: Secondary | ICD-10-CM | POA: Insufficient documentation

## 2014-08-30 DIAGNOSIS — X58XXXD Exposure to other specified factors, subsequent encounter: Secondary | ICD-10-CM | POA: Insufficient documentation

## 2014-08-30 DIAGNOSIS — I10 Essential (primary) hypertension: Secondary | ICD-10-CM | POA: Insufficient documentation

## 2014-08-30 DIAGNOSIS — S39012D Strain of muscle, fascia and tendon of lower back, subsequent encounter: Secondary | ICD-10-CM | POA: Insufficient documentation

## 2014-08-30 MED ORDER — NAPROXEN 500 MG PO TABS: 500.0000 mg | ORAL_TABLET | Freq: Two times a day (BID) | ORAL | Status: DC

## 2014-08-30 MED ORDER — NAPROXEN 500 MG PO TABS
500.0000 mg | ORAL_TABLET | Freq: Once | ORAL | Status: AC
  Administered 2014-08-30: 500 mg via ORAL
  Filled 2014-08-30: qty 1

## 2014-08-30 NOTE — ED Notes (Signed)
Pt c/o left mid back pain radiating to left leg. Reports "the pain started 7 months ago but they haven't done any xrays or scans. They prescribed me muscle relaxers and stuff but none of that worked so I just stopped taking it. I work two jobs so [employer]'m on my feet all day. I've had numbness and tingling in my left leg and my left arm kind of like it's pinching a nerve. I've been having muscle twitching all over even in my eyes." Hasn't taken any medications today. No other complaints/concerns. Awaiting MD/PA.

## 2014-08-30 NOTE — Discharge Instructions (Signed)
You were evaluated today in the ED for your chronic back pain. Your symptoms are likely due to a muscle strain. We will start Aleve in order to help with the inflammation and pain. You'll need to followup with your primary care for further evaluation and management of your back pain. Back Pain, Adult Low back pain is very common. About 1 in 5 people have back pain.The cause of low back pain is rarely dangerous. The pain often gets better over time.About half of people with a sudden onset of back pain feel better in just 2 weeks. About 8 in 10 people feel better by 6 weeks.  CAUSES Some common causes of back pain include:  Strain of the muscles or ligaments supporting the spine.  Wear and tear (degeneration) of the spinal discs.  Arthritis.  Direct injury to the back. DIAGNOSIS Most of the time, the direct cause of low back pain is not known.However, back pain can be treated effectively even when the exact cause of the pain is unknown.Answering your caregiver's questions about your overall health and symptoms is one of the most accurate ways to make sure the cause of your pain is not dangerous. If your caregiver needs more information, he or she may order lab work or imaging tests (X-rays or MRIs).However, even if imaging tests show changes in your back, this usually does not require surgery. HOME CARE INSTRUCTIONS For many people, back pain returns.Since low back pain is rarely dangerous, it is often a condition that people can learn to [facility] Hospitalmanageon their own.   Remain active. It is stressful on the back to sit or stand in one place. Do not sit, drive, or stand in one place for more than 30 minutes at a time. Take short walks on level surfaces as soon as pain allows.Try to increase the length of time you walk each day.  Do not stay in bed.Resting more than 1 or 2 days can delay your recovery.  Do not avoid exercise or work.Your body is made to move.It is not dangerous to be active, even  though your back may hurt.Your back will likely heal faster if you return to being active before your pain is gone.  Pay attention to your body when you bend and lift. Many people have less discomfortwhen lifting if they bend their knees, keep the load close to their bodies,and avoid twisting. Often, the most comfortable positions are those that put less stress on your recovering back.  Find a comfortable position to sleep. Use a firm mattress and lie on your side with your knees slightly bent. If you lie on your back, put a pillow under your knees.  Only take over-the-counter or prescription medicines as directed by your caregiver. Over-the-counter medicines to reduce pain and inflammation are often the most helpful.Your caregiver may prescribe muscle relaxant drugs.These medicines help dull your pain so you can more quickly return to your normal activities and healthy exercise.  Put ice on the injured area.  Put ice in a plastic bag.  Place a towel between your skin and the bag.  Leave the ice on for 15-20 minutes, 03-04 times a day for the first 2 to 3 days. After that, ice and heat may be alternated to reduce pain and spasms.  Ask your caregiver about trying back exercises and gentle massage. This may be of some benefit.  Avoid feeling anxious or stressed.Stress increases muscle tension and can worsen back pain.It is important to recognize when you are anxious or  stressed and learn ways to manage it.Exercise is a great option. SEEK MEDICAL CARE IF:  You have pain that is not relieved with rest or medicine.  You have pain that does not improve in 1 week.  You have new symptoms.  You are generally not feeling well. SEEK IMMEDIATE MEDICAL CARE IF:   You have pain that radiates from your back into your legs.  You develop new bowel or bladder control problems.  You have unusual weakness or numbness in your arms or legs.  You develop nausea or vomiting.  You develop  abdominal pain.  You feel faint. Document Released: 11/01/2005 Document Revised: 05/02/2012 Document Reviewed: 03/05/2014 [facility] Patient Information 2015 [place], [place]. This information is not intended to replace advice given to you by your health care provider. Make sure you discuss any questions you have with your health care provider.

## 2014-08-30 NOTE — Progress Notes (Signed)
[facility] St[facility],  Provided pt with a list of primary care resources and a [facility] application. Patient has apt scheduled with [facility] Financial Counselor on 10/19 at 2:00pm and a PCP appt on 10/19 at 4:30 pm. Provided pt all appt information.

## 2014-08-30 NOTE — ED Provider Notes (Signed)
CSN: [id number]     Arrival date & time 08/30/14  1130 History   First MD Initiated Contact with Patient 08/30/14 1152     Chief Complaint  Patient presents with  . Back Pain     (Consider location/radiation/quality/duration/timing/severity/associated sxs/prior Treatment) Patient is a 33 y.o. male presenting with back pain.  Back Pain Associated symptoms: no chest pain, no fever and no weakness    This chart was scribed for non-physician practitioner, working with Elwin MochaBlair Walden, MD, by Andrew Auaven Small, ED Scribe. This patient was seen in room WTR5/WTR5 and the patient's care was started at 11:56 AM.  Eloise LevelsAlex T Mccormack is a 33 y.o. male who presents to the Emergency Department complaining of mid back pain described as "pulling" onset 7 months ago. He reports having been seen for this multiple times in the past and found to have a benign workup. He has been given Robaxin but has discontinued and says "it is not helping". 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 Patient denies taking any other OTC medications. No back pain red flags [facility] who directed him to PCP for muscles strain. Past Medical History  Diagnosis Date  . Alcoholic fatty liver   . Hypertension    History reviewed. No pertinent past surgical history. Family History  Problem Relation Age of Onset  . Diabetes Mother   . Hypertension Mother   . Cancer Father     liver  . Hypertension Father   . Cancer Paternal Uncle     coon cancer    History  Substance Use Topics  . Smoking status: Current Some Day Smoker -- 1.00 packs/day for 14 years    Types: Cigarettes    Last Attempt to Quit: 10/24/2013  . Smokeless tobacco: Not on file  . Alcohol Use: No    Review of Systems  Constitutional: Negative for fever.  Respiratory: Negative for shortness of breath.   Cardiovascular: Negative for chest pain.  Musculoskeletal: Positive for back  pain.  Skin: Negative for rash.  Neurological: Negative for weakness.  All other systems reviewed and are negative.  Allergies  Review of patient's allergies indicates no known allergies.  Home Medications   Prior to Admission medications   Medication Sig Start Date End Date Taking? Authorizing Provider  methocarbamol (ROBAXIN) 500 MG tablet Take 1 tablet (500 mg total) by mouth 2 (two) times daily as needed for muscle spasms. 07/21/14   Sharlene MottsBenjamin W Navaya Wiatrek, PA-C  Multiple Vitamin (MULTIVITAMIN WITH MINERALS) TABS tablet Take 1 tablet by mouth every morning.    Historical Provider, MD  Probiotic Product (ALIGN) 4 MG CAPS Take 4 mg by mouth every morning.     Historical Provider, MD  Vitamin D, Ergocalciferol, (DRISDOL) 50000 UNITS CAPS capsule Take 1 capsule (50,000 Units total) by mouth every 7 (seven) days. 08/01/14   Olugbemiga E Hyman HopesJegede, MD   BP 154/81  Pulse 95  Temp(Src) 98.3 F (36.8 C) (Oral)  Resp 16  SpO2 100% Physical Exam  Nursing note and vitals reviewed. Constitutional: He is oriented to person, place, and time. He appears well-developed and well-nourished. No distress.  Awake, alert, nontoxic appearance with baseline speech.  HENT:  Head: Normocephalic and atraumatic.  Eyes: Conjunctivae and EOM are normal. Pupils are equal, round, and reactive to light. Right eye exhibits no discharge. Left eye exhibits no discharge.  Neck: Neck supple.  Cardiovascular: Normal rate and regular rhythm.   No murmur heard. Pulmonary/Chest: Effort normal and breath sounds normal. No respiratory distress. He has no wheezes. He has no rales. He exhibits no tenderness.  Abdominal: Soft. Bowel sounds are normal. He exhibits no mass. There is no tenderness. There is no rebound.  Musculoskeletal: Normal range of motion.       Arms:  Bilateral upper and lower extremities non tender without new rashes or color change, baseline ROM with intact DP / PT pulses, CR<2 secs all digits bilaterally,  sensation baseline light touch bilaterally for pt, motor symmetric bilateral 5 / 5 hip flexion, quadriceps, hamstrings, EHL, foot dorsiflexion, foot plantarflexion, gait somewhat antalgic but without apparent new ataxia.  Neurological: He is alert and oriented to person, place, and time.  Mental status baseline for patient.  Upper extremity motor strength and sensation intact and symmetric bilaterally. No focal neurodeficits  Skin: Skin is warm and dry. No rash noted.  Psychiatric: He has a normal mood and affect. His behavior is normal.    ED Course  Procedures (including critical care time) DIAGNOSTIC STUDIES: Oxygen Saturation is 100% on RA, normal by my interpretation.    COORDINATION OF CARE: 12:05 PM- Pt advised of plan for treatment including and pt agrees.  . Labs Review Labs Reviewed - No data to display  Imaging Review No results found.   EKG Interpretation None      MDM  Vitals stable - WNL -afebrile Pt resting comfortably in ED. able to ambulate all 4 extremities with appropriate range of motion without discomfort. No evidence of fracture, dislocation or other osseous abnormality. Less likely to be zoster given the prolonged timing. Patient has not tried NSAIDs for therapy. Will initiate naproxen for pain and inflammation management. Also recommended stretching. Recommended followup with [facility] in order to establish care with a primary provider  Discussed f/u with PCP and return precautions, pt very amenable to plan. Patient stable, in good condition and is appropriate for discharge  Final diagnoses:  Back strain, subsequent encounter    I personally performed the services described in this documentation, which was scribed in my presence. The recorded information has been reviewed and is accurate.      Sharlene MottsBenjamin W Raymonde Hamblin, PA-C 08/30/14 1243

## 2014-08-30 NOTE — ED Provider Notes (Signed)
Medical screening examination/treatment/procedure(s) were performed by non-physician practitioner and as supervising physician I was immediately available for consultation/collaboration.   EKG Interpretation None        Douglas MochaBlair Shayne Deerman, MD 08/30/14 1623

## 2014-09-02 ENCOUNTER — Ambulatory Visit: Payer: Self-pay | Attending: Internal Medicine

## 2014-09-02 ENCOUNTER — Ambulatory Visit (HOSPITAL_BASED_OUTPATIENT_CLINIC_OR_DEPARTMENT_OTHER): Payer: Self-pay | Admitting: Internal Medicine

## 2014-09-02 ENCOUNTER — Encounter: Payer: Self-pay | Admitting: Internal Medicine

## 2014-09-02 VITALS — BP 131/81 | HR 72 | Temp 97.9°F | Resp 16 | Wt 203.4 lb

## 2014-09-02 DIAGNOSIS — F17210 Nicotine dependence, cigarettes, uncomplicated: Secondary | ICD-10-CM | POA: Insufficient documentation

## 2014-09-02 DIAGNOSIS — R202 Paresthesia of skin: Secondary | ICD-10-CM | POA: Insufficient documentation

## 2014-09-02 DIAGNOSIS — R42 Dizziness and giddiness: Secondary | ICD-10-CM | POA: Insufficient documentation

## 2014-09-02 DIAGNOSIS — R200 Anesthesia of skin: Secondary | ICD-10-CM

## 2014-09-02 DIAGNOSIS — R253 Fasciculation: Secondary | ICD-10-CM | POA: Insufficient documentation

## 2014-09-02 DIAGNOSIS — G245 Blepharospasm: Secondary | ICD-10-CM

## 2014-09-02 DIAGNOSIS — K700 Alcoholic fatty liver: Secondary | ICD-10-CM | POA: Insufficient documentation

## 2014-09-02 DIAGNOSIS — I10 Essential (primary) hypertension: Secondary | ICD-10-CM | POA: Insufficient documentation

## 2014-09-02 MED ORDER — GABAPENTIN 100 MG PO CAPS: 100.0000 mg | ORAL_CAPSULE | Freq: Three times a day (TID) | ORAL | Status: DC

## 2014-09-02 MED ORDER — MECLIZINE HCL 25 MG PO TABS: 25.0000 mg | ORAL_TABLET | Freq: Three times a day (TID) | ORAL | Status: DC | PRN

## 2014-09-02 NOTE — Progress Notes (Signed)
MRN: [id number] Name: Douglas Wong  Sex: male Age: 33 y.o. DOB: Mar 17, 1981  Allergies: Review of patient's allergies indicates no known allergies.  Chief Complaint  Patient presents with  . Follow-up    HPI: Patient is 33 y.o. male who recently went to the emergency room complaining of lower back pain, patient already had an x-ray done which was reviewed and was negative, patient was prescribed naproxen and muscle relaxant, he also has history of eye twitch currently denies any change in vision occasionally has dizziness which she feels room is spinning sensation denies any chest pain or shortness of breath, he also reports some numbness tingling in the hands.  Past Medical History  Diagnosis Date  . Alcoholic fatty liver   . Hypertension     History reviewed. No pertinent past surgical history.    Medication List       This list is accurate as of: 09/02/14  5:49 PM.  Always use your most recent med list.               ALIGN 4 MG Caps  Take 4 mg by mouth every morning.     gabapentin 100 MG capsule  Commonly known as:  NEURONTIN  Take 1 capsule (100 mg total) by mouth 3 (three) times daily.     meclizine 25 MG tablet  Commonly known as:  ANTIVERT  Take 1 tablet (25 mg total) by mouth 3 (three) times daily as needed for nausea.     methocarbamol 500 MG tablet  Commonly known as:  ROBAXIN  Take 1 tablet (500 mg total) by mouth 2 (two) times daily as needed for muscle spasms.     multivitamin with minerals Tabs tablet  Take 1 tablet by mouth every morning.     naproxen 500 MG tablet  Commonly known as:  NAPROSYN  Take 1 tablet (500 mg total) by mouth 2 (two) times daily.     Vitamin D (Ergocalciferol) 50000 UNITS Caps capsule  Commonly known as:  DRISDOL  Take 1 capsule (50,000 Units total) by mouth every 7 (seven) days.        Meds ordered this encounter  Medications  . gabapentin (NEURONTIN) 100 MG capsule    Sig: Take 1 capsule (100 mg total) by  mouth 3 (three) times daily.    Dispense:  90 capsule    Refill:  3  . meclizine (ANTIVERT) 25 MG tablet    Sig: Take 1 tablet (25 mg total) by mouth 3 (three) times daily as needed for nausea.    Dispense:  30 tablet    Refill:  1     There is no immunization history on file for this patient.  Family History  Problem Relation Age of Onset  . Diabetes Mother   . Hypertension Mother   . Cancer Father     liver  . Hypertension Father   . Cancer Paternal Uncle     coon cancer     History  Substance Use Topics  . Smoking status: Current Some Day Smoker -- 1.00 packs/day for 14 years    Types: Cigarettes    Last Attempt to Quit: 10/24/2013  . Smokeless tobacco: Not on file  . Alcohol Use: No    Review of Systems   As noted in HPI  Filed Vitals:   09/02/14 1647  BP: 131/81  Pulse: 72  Temp: 97.9 F (36.6 C)  Resp: 16    Physical Exam  Physical Exam  HENT:  Both TMs visualized not congested  Eyes: EOM are normal. Pupils are equal, round, and reactive to light.  No nystagmus  Neck: Neck supple.  Cardiovascular: Normal rate and regular rhythm.   Pulmonary/Chest: Breath sounds normal. No respiratory distress. He has no wheezes. He has no rales.  Musculoskeletal: He exhibits no edema.  Strength equal in all extremities    CBC    Component Value Date/Time   WBC 4.7 07/31/2014 1600   RBC 5.00 07/31/2014 1600   HGB 15.0 07/31/2014 1600   HCT 41.9 07/31/2014 1600   PLT 215 07/31/2014 1600   MCV 83.8 07/31/2014 1600   LYMPHSABS 2.1 07/31/2014 1600   MONOABS 0.4 07/31/2014 1600   EOSABS 0.0 07/31/2014 1600   BASOSABS 0.0 07/31/2014 1600    CMP     Component Value Date/Time   NA 139 07/31/2014 1600   K 4.2 07/31/2014 1600   CL 100 07/31/2014 1600   CO2 31 07/31/2014 1600   GLUCOSE 84 07/31/2014 1600   BUN 11 07/31/2014 1600   CREATININE 0.94 07/31/2014 1600   CREATININE 0.95 02/02/2014 2040   CALCIUM 10.0 07/31/2014 1600   PROT 7.6 07/31/2014 1600   ALBUMIN 4.9  07/31/2014 1600   AST 36 07/31/2014 1600   ALT 79* 07/31/2014 1600   ALKPHOS 78 07/31/2014 1600   BILITOT 0.4 07/31/2014 1600   GFRNONAA >89 07/31/2014 1600   GFRNONAA >90 02/02/2014 2040   GFRAA >89 07/31/2014 1600   GFRAA >90 02/02/2014 2040    No results found for this basename: chol, tri, ldl    No components found with this basename: hga1c    Lab Results  Component Value Date/Time   AST 36 07/31/2014  4:00 PM    Assessment and Plan  Numbness and tingling - Plan: Trial of gabapentin (NEURONTIN) 100 MG capsule, will check Vitamin B12  Eye twitch - Plan: Trial of gabapentin (NEURONTIN) 100 MG capsule  Dizziness and giddiness - Plan: Vitals are stable, Trial of meclizine (ANTIVERT) 25 MG tablet    Return in about 3 months (around 12/03/2014).  Doris CheadleADVANI, Waldemar Siegel, MD

## 2014-09-02 NOTE — Progress Notes (Signed)
Patient complains of eyes twitching the past three weeks Has been having some dizzy spells Complains of back pain as well.-requesting an x ray

## 2014-09-03 DIAGNOSIS — R200 Anesthesia of skin: Secondary | ICD-10-CM | POA: Insufficient documentation

## 2014-09-03 DIAGNOSIS — R42 Dizziness and giddiness: Secondary | ICD-10-CM | POA: Insufficient documentation

## 2014-09-03 DIAGNOSIS — R202 Paresthesia of skin: Principal | ICD-10-CM

## 2014-09-03 LAB — VITAMIN B12: Vitamin B-12: 908 pg/mL (ref 211–911)

## 2014-09-16 ENCOUNTER — Encounter (HOSPITAL_COMMUNITY): Payer: Self-pay | Admitting: Emergency Medicine

## 2014-09-16 ENCOUNTER — Emergency Department (HOSPITAL_COMMUNITY)
Admission: EM | Admit: 2014-09-16 | Discharge: 2014-09-16 | Disposition: A | Discharge status: Home / Self Care | Payer: Self-pay | Attending: Emergency Medicine | Admitting: Emergency Medicine

## 2014-09-16 DIAGNOSIS — I10 Essential (primary) hypertension: Secondary | ICD-10-CM | POA: Insufficient documentation

## 2014-09-16 DIAGNOSIS — Z8719 Personal history of other diseases of the digestive system: Secondary | ICD-10-CM | POA: Insufficient documentation

## 2014-09-16 DIAGNOSIS — G4489 Other headache syndrome: Secondary | ICD-10-CM | POA: Insufficient documentation

## 2014-09-16 DIAGNOSIS — Z720 Tobacco use: Secondary | ICD-10-CM | POA: Insufficient documentation

## 2014-09-16 DIAGNOSIS — Z791 Long term (current) use of non-steroidal anti-inflammatories (NSAID): Secondary | ICD-10-CM | POA: Insufficient documentation

## 2014-09-16 DIAGNOSIS — R110 Nausea: Secondary | ICD-10-CM

## 2014-09-16 DIAGNOSIS — Z79899 Other long term (current) drug therapy: Secondary | ICD-10-CM | POA: Insufficient documentation

## 2014-09-16 LAB — CBC WITH DIFFERENTIAL/PLATELET
BASOS PCT: 0 % (ref 0–1)
Basophils Absolute: 0 10*3/uL (ref 0.0–0.1)
Eosinophils Absolute: 0.2 10*3/uL (ref 0.0–0.7)
Eosinophils Relative: 3 % (ref 0–5)
HCT: 39 % (ref 39.0–52.0)
HEMOGLOBIN: 13.5 g/dL (ref 13.0–17.0)
LYMPHS ABS: 2 10*3/uL (ref 0.7–4.0)
Lymphocytes Relative: 42 % (ref 12–46)
MCH: 30.1 pg (ref 26.0–34.0)
MCHC: 34.6 g/dL (ref 30.0–36.0)
MCV: 87.1 fL (ref 78.0–100.0)
MONOS PCT: 10 % (ref 3–12)
Monocytes Absolute: 0.5 10*3/uL (ref 0.1–1.0)
NEUTROS ABS: 2.2 10*3/uL (ref 1.7–7.7)
NEUTROS PCT: 45 % (ref 43–77)
Platelets: 182 10*3/uL (ref 150–400)
RBC: 4.48 MIL/uL (ref 4.22–5.81)
RDW: 12.8 % (ref 11.5–15.5)
WBC: 4.9 10*3/uL (ref 4.0–10.5)

## 2014-09-16 LAB — BASIC METABOLIC PANEL
Anion gap: 11 (ref 5–15)
BUN: 13 mg/dL (ref 6–23)
CHLORIDE: 105 meq/L (ref 96–112)
CO2: 26 mEq/L (ref 19–32)
Calcium: 9 mg/dL (ref 8.4–10.5)
Creatinine, Ser: 0.96 mg/dL (ref 0.50–1.35)
GFR calc non Af Amer: >90 mL/min (ref >90)
GLUCOSE: 87 mg/dL (ref 70–99)
Potassium: 4.4 mEq/L (ref 3.7–5.3)
Sodium: 142 mEq/L (ref 137–147)

## 2014-09-16 MED ORDER — DIPHENHYDRAMINE HCL 50 MG/ML IJ SOLN
25.0000 mg | Freq: Once | INTRAMUSCULAR | Status: AC
  Administered 2014-09-16: 25 mg via INTRAVENOUS
  Filled 2014-09-16: qty 1

## 2014-09-16 MED ORDER — KETOROLAC TROMETHAMINE 30 MG/ML IJ SOLN
30.0000 mg | Freq: Once | INTRAMUSCULAR | Status: AC
  Administered 2014-09-16: 30 mg via INTRAVENOUS
  Filled 2014-09-16: qty 1

## 2014-09-16 MED ORDER — PROMETHAZINE HCL 25 MG PO TABS: 25.0000 mg | ORAL_TABLET | Freq: Four times a day (QID) | ORAL | Status: DC | PRN

## 2014-09-16 MED ORDER — METOCLOPRAMIDE HCL 5 MG/ML IJ SOLN
10.0000 mg | Freq: Once | INTRAMUSCULAR | Status: AC
  Administered 2014-09-16: 10 mg via INTRAVENOUS
  Filled 2014-09-16: qty 2

## 2014-09-16 MED ORDER — SODIUM CHLORIDE 0.9 % IV BOLUS (SEPSIS)
1000.0000 mL | Freq: Once | INTRAVENOUS | Status: AC
  Administered 2014-09-16: 1000 mL via INTRAVENOUS

## 2014-09-16 MED ORDER — TRAMADOL HCL 50 MG PO TABS: 50.0000 mg | ORAL_TABLET | Freq: Four times a day (QID) | ORAL | Status: DC | PRN

## 2014-09-16 NOTE — ED Provider Notes (Signed)
CSN: [id number]     Arrival date & time 09/16/14  1147 History   First MD Initiated Contact with Patient 09/16/14 1401     Chief Complaint  Patient presents with  . Headache     (Consider location/radiation/quality/duration/timing/severity/associated sxs/prior Treatment) HPI Comments: Patient is a 33 year old male with a past medical history of hypertension who presents with a headache for 30 days. Patient reports a gradual onset and progressive worsening of the headache. The pain is sharp, constant and is located in generalized head without radiation. Patient has tried meclizine and neurontin for symptoms without relief. Patient was prescribed these medications at his PCP office. No alleviating/aggravating factors. Patient reports associated nausea. Patient denies fever, vomiting, diarrhea, numbness/tingling, weakness, visual changes, congestion, chest pain, SOB, abdominal pain.      Past Medical History  Diagnosis Date  . Alcoholic fatty liver   . Hypertension    History reviewed. No pertinent past surgical history. Family History  Problem Relation Age of Onset  . Diabetes Mother   . Hypertension Mother   . Cancer Father     liver  . Hypertension Father   . Cancer Paternal Uncle     coon cancer    History  Substance Use Topics  . Smoking status: Current Some Day Smoker -- 0.25 packs/day for 14 years    Types: Cigarettes    Last Attempt to Quit: 10/24/2013  . Smokeless tobacco: Not on file  . Alcohol Use: No    Review of Systems  Constitutional: Negative for fever, chills and fatigue.  HENT: Negative for trouble swallowing.   Eyes: Negative for visual disturbance.  Respiratory: Negative for shortness of breath.   Cardiovascular: Negative for chest pain and palpitations.  Gastrointestinal: Positive for nausea. Negative for vomiting, abdominal pain and diarrhea.  Genitourinary: Negative for dysuria and difficulty urinating.  Musculoskeletal: Negative for arthralgias and  neck pain.  Skin: Negative for color change.  Neurological: Positive for headaches. Negative for dizziness and weakness.  Psychiatric/Behavioral: Negative for dysphoric mood.      Allergies  Review of patient's allergies indicates no known allergies.  Home Medications   Prior to Admission medications   Medication Sig Start Date End Date Taking? Authorizing Provider  gabapentin (NEURONTIN) 100 MG capsule Take 1 capsule (100 mg total) by mouth 3 (three) times daily. 09/02/14   Doris Cheadleeepak Advani, MD  meclizine (ANTIVERT) 25 MG tablet Take 1 tablet (25 mg total) by mouth 3 (three) times daily as needed for nausea. 09/02/14   Doris Cheadleeepak Advani, MD  methocarbamol (ROBAXIN) 500 MG tablet Take 1 tablet (500 mg total) by mouth 2 (two) times daily as needed for muscle spasms. 07/21/14   Sharlene MottsBenjamin W Cartner, PA-C  Multiple Vitamin (MULTIVITAMIN WITH MINERALS) TABS tablet Take 1 tablet by mouth every morning.    Historical Provider, MD  naproxen (NAPROSYN) 500 MG tablet Take 1 tablet (500 mg total) by mouth 2 (two) times daily. 08/30/14   Sharlene MottsBenjamin W Cartner, PA-C  Probiotic Product (ALIGN) 4 MG CAPS Take 4 mg by mouth every morning.     Historical Provider, MD  Vitamin D, Ergocalciferol, (DRISDOL) 50000 UNITS CAPS capsule Take 1 capsule (50,000 Units total) by mouth every 7 (seven) days. 08/01/14   Quentin Angstlugbemiga E Jegede, MD   BP 125/73 mmHg  Pulse 70  Temp(Src) 98.1 F (36.7 C) (Oral)  Resp 18  Ht 6' (1.829 m)  Wt 203 lb (92.08 kg)  BMI 27.53 kg/m2  SpO2 100% Physical Exam  Constitutional:  He is oriented to person, place, and time. He appears well-developed and well-nourished. No distress.  HENT:  Head: Normocephalic and atraumatic.  Eyes: Conjunctivae and EOM are normal.  Neck: Normal range of motion.  Cardiovascular: Normal rate and regular rhythm.  Exam reveals no gallop and no friction rub.   No murmur heard. Pulmonary/Chest: Effort normal and breath sounds normal. He has no wheezes. He has no  rales. He exhibits no tenderness.  Abdominal: Soft. He exhibits no distension. There is no tenderness. There is no rebound.  Musculoskeletal: Normal range of motion.  Neurological: He is alert and oriented to person, place, and time. No cranial nerve deficit. Coordination normal.  Extremity strength and sensation equal and intact bilaterally. Speech is goal-oriented. Moves limbs without ataxia.   Skin: Skin is warm and dry.  Psychiatric: He has a normal mood and affect. His behavior is normal.  Nursing note and vitals reviewed.   ED Course  Procedures (including critical care time) Labs Review Labs Reviewed  CBC WITH DIFFERENTIAL  BASIC METABOLIC PANEL    Imaging Review No results found.   EKG Interpretation None      MDM   Final diagnoses:  Other headache syndrome  Nausea    2:03 PM Patient will have migraine cocktail here. Vitals stable and patient afebrile. Labs pending.   Labs unremarkable. Patient feeling better. Patient will have PCP follow up.   Emilia BeckKaitlyn Leeta Grimme, [place] JerseyPA-C 09/17/14 [phone]

## 2014-09-16 NOTE — ED Notes (Signed)
IV d/c per RN. Catheter intact. Site clean, dry, and normal colored. No bleeding.

## 2014-09-16 NOTE — ED Notes (Signed)
Patient states he has headache that has lasted over 30 days.   [facility] but the medicine prescribed didn't work.   Patient states didn't follow up with them, came straight here.

## 2014-09-16 NOTE — ED Notes (Signed)
Pt stated that he has a hard time staying focused.

## 2014-09-16 NOTE — Discharge Instructions (Signed)
Take Tramadol as needed for headache. Take phenergan as needed for nausea. Refer to attached documents for more information. Follow up with your doctor for further evaluation.

## 2014-09-17 NOTE — Discharge Planning (Signed)
[facility]  Spoke to patient regarding primary care resources and [facility]. Patient states he should be obtaining insurance through his employer in the upcoming weeks. Primary care resource guide and my contact information also provided for any future questions or concerns. No other [facility]lig[facility] Specialist needs identified at this time.

## 2014-09-23 ENCOUNTER — Telehealth: Payer: Self-pay | Admitting: Internal Medicine

## 2014-09-23 ENCOUNTER — Encounter (HOSPITAL_COMMUNITY): Payer: Self-pay | Admitting: Emergency Medicine

## 2014-09-23 ENCOUNTER — Emergency Department (HOSPITAL_COMMUNITY)
Admission: EM | Admit: 2014-09-23 | Discharge: 2014-09-23 | Disposition: A | Discharge status: Home / Self Care | Payer: Self-pay | Attending: Emergency Medicine | Admitting: Emergency Medicine

## 2014-09-23 DIAGNOSIS — R200 Anesthesia of skin: Secondary | ICD-10-CM | POA: Insufficient documentation

## 2014-09-23 DIAGNOSIS — R5383 Other fatigue: Secondary | ICD-10-CM | POA: Insufficient documentation

## 2014-09-23 DIAGNOSIS — Z720 Tobacco use: Secondary | ICD-10-CM | POA: Insufficient documentation

## 2014-09-23 DIAGNOSIS — Z7982 Long term (current) use of aspirin: Secondary | ICD-10-CM | POA: Insufficient documentation

## 2014-09-23 DIAGNOSIS — R0981 Nasal congestion: Secondary | ICD-10-CM | POA: Insufficient documentation

## 2014-09-23 DIAGNOSIS — R42 Dizziness and giddiness: Secondary | ICD-10-CM | POA: Insufficient documentation

## 2014-09-23 DIAGNOSIS — I10 Essential (primary) hypertension: Secondary | ICD-10-CM | POA: Insufficient documentation

## 2014-09-23 DIAGNOSIS — Z79899 Other long term (current) drug therapy: Secondary | ICD-10-CM | POA: Insufficient documentation

## 2014-09-23 MED ORDER — FLUTICASONE PROPIONATE 50 MCG/ACT NA SUSP: 2.0000 | Freq: Every day | NASAL | Status: DC

## 2014-09-23 NOTE — Discharge Instructions (Signed)

## 2014-09-23 NOTE — ED Notes (Signed)
Pt c/o bilateral eye twitching x 1 month and dizziness x 1 month but states the dizziness has gotten worse es[pecially when standing.

## 2014-09-23 NOTE — ED Provider Notes (Signed)
CSN: [id number]     Arrival date & time 09/23/14  1351 History   None    Chief Complaint  Patient presents with  . Dizziness  . Eye Problem    twitching of both   HPI  Patient is a 33 y.o. Male who presents to the ED with multiple complaints for 1 month.  Per the patient the main complaint is that he has been very dizzy and lightheaded for the past month when he is standing up.  Patient states that he has also been having bilateral eye twitching, and has "been feeling out of it." [facility]  Patient states that they gave him some gabapentin and also some phenergan and told him it was likely due to anxiety.  He wants to be evaluated today here because this is causing him to miss work and that he cannot miss work any more.  He states that he is not sleeping well.    Past Medical History  Diagnosis Date  . Alcoholic fatty liver   . Hypertension    History reviewed. No pertinent past surgical history. Family History  Problem Relation Age of Onset  . Diabetes Mother   . Hypertension Mother   . Cancer Father     liver  . Hypertension Father   . Cancer Paternal Uncle     coon cancer    History  Substance Use Topics  . Smoking status: Current Some Day Smoker -- 0.25 packs/day for 14 years    Types: Cigarettes    Last Attempt to Quit: 10/24/2013  . Smokeless tobacco: Not on file  . Alcohol Use: No    Review of Systems  Constitutional: Positive for fatigue. Negative for fever, chills and diaphoresis.  HENT: Positive for congestion. Negative for drooling, facial swelling, hearing loss and rhinorrhea.   Respiratory: Negative for chest tightness and shortness of breath.   Cardiovascular: Negative for chest pain, palpitations and leg swelling.  Gastrointestinal: Negative for nausea, vomiting, abdominal pain, diarrhea, constipation and blood in stool.  Neurological: Positive for dizziness, light-headedness and numbness  (bilateral hands, comes and goes). Negative for tremors, seizures, weakness and headaches.  Psychiatric/Behavioral: Positive for decreased concentration. Negative for confusion.  All other systems reviewed and are negative.     Allergies  Review of patient's allergies indicates no known allergies.  Home Medications   Prior to Admission medications   Medication Sig Start Date End Date Taking? Authorizing Provider  aspirin-acetaminophen-caffeine (EXCEDRIN MIGRAINE) [phone] [id number] MG per tablet Take 2 tablets by mouth every 6 (six) hours as needed for headache (headache).   Yes Historical Provider, MD  gabapentin (NEURONTIN) 100 MG capsule Take 1 capsule (100 mg total) by mouth 3 (three) times daily. 09/02/14  Yes Doris Cheadleeepak Advani, MD  meclizine (ANTIVERT) 25 MG tablet Take 1 tablet (25 mg total) by mouth 3 (three) times daily as needed for nausea. 09/02/14  Yes Doris Cheadleeepak Advani, MD  methocarbamol (ROBAXIN) 500 MG tablet Take 1 tablet (500 mg total) by mouth 2 (two) times daily as needed for muscle spasms. 07/21/14  Yes Earle GellBenjamin W Cartner, PA-C  Multiple Vitamin (MULTIVITAMIN WITH MINERALS) TABS tablet Take 1 tablet by mouth every morning.   Yes Historical Provider, MD  naproxen (NAPROSYN) 500 MG tablet Take 1 tablet (500 mg total) by mouth 2 (two) times daily. 08/30/14  Yes Earle GellBenjamin W Cartner, PA-C  Probiotic Product (ALIGN) 4 MG CAPS Take 4 mg by mouth every morning.  Yes Historical Provider, MD  promethazine (PHENERGAN) 25 MG tablet Take 1 tablet (25 mg total) by mouth every 6 (six) hours as needed for nausea or vomiting. 09/16/14  Yes Kaitlyn Szekalski, PA-C  Vitamin D, Ergocalciferol, (DRISDOL) 50000 UNITS CAPS capsule Take 1 capsule (50,000 Units total) by mouth every 7 (seven) days. 08/01/14  Yes Quentin Angst, MD  fluticasone (FLONASE) 50 MCG/ACT nasal spray Place 2 sprays into both nostrils daily. 09/23/14   Revanth Neidig A Forcucci, PA-C  traMADol (ULTRAM) 50 MG tablet Take 1 tablet (50 mg total)  by mouth every 6 (six) hours as needed. 09/16/14   Kaitlyn Szekalski, PA-C   BP 139/75 mmHg  Pulse 76  Temp(Src) 98 F (36.7 C) (Oral)  Resp 25  SpO2 96% Physical Exam  Constitutional: He is oriented to person, place, and time. He appears well-developed and well-nourished. No distress.  HENT:  Head: Normocephalic and atraumatic.  Right Ear: A middle ear effusion is present.  Left Ear: A middle ear effusion is present.  Nose: Mucosal edema present. Right sinus exhibits no maxillary sinus tenderness and no frontal sinus tenderness. Left sinus exhibits no maxillary sinus tenderness and no frontal sinus tenderness.  Mouth/Throat: Oropharynx is clear and moist. No oropharyngeal exudate.  Eyes: Conjunctivae and EOM are normal. Pupils are equal, round, and reactive to light. No scleral icterus.  Neck: Normal range of motion. Neck supple. No JVD present. No thyromegaly present.  Cardiovascular: Normal rate, regular rhythm, normal heart sounds and intact distal pulses.  Exam reveals no gallop and no friction rub.   No murmur heard. Pulmonary/Chest: Effort normal and breath sounds normal. No respiratory distress. He has no wheezes. He has no rales. He exhibits no tenderness.  Abdominal: Soft. Bowel sounds are normal. He exhibits no distension and no mass. There is no tenderness. There is no rebound and no guarding.  Musculoskeletal: Normal range of motion.  Lymphadenopathy:    He has no cervical adenopathy.  Neurological: He is alert and oriented to person, place, and time. He has normal strength. No cranial nerve deficit or sensory deficit. Coordination normal.  Negative pronator drift in the arms and legs.  Normal heel to shin and alternating hand tapping.    Skin: Skin is warm and dry. He is not diaphoretic.  Psychiatric: He has a normal mood and affect. His behavior is normal. Judgment and thought content normal.  Nursing note and vitals reviewed.   ED Course  Procedures (including critical  care time) Labs Review Labs Reviewed - No data to display  Imaging Review No results found.   EKG Interpretation None      MDM   Final diagnoses:  Lightheadedness  Sinus congestion   Patient is a 34 y.o. Male who presents to the ED with dizziness and lightheadedness.  Physical exam reveals middle ear effusions with some mucosal edema.  There is no evidence of any neurological deficits.  Further imaging and labs are unnecesary at this time.  Will discharge the patient home with flonase and will try a 3 day course of Afrin to determine whether this lightheadedness is due to sinus congestion.  Patient does not exhibit orthostasis.  Patient to follow-up with his PCP.  He is to return for sudden onset headache that is the worst of his life, weakness or any other concerning symptoms.  Patient is stable for discharge.  He states understanding and agreement.  Patient has been discussed with Dr. Effie Shy who agrees with the above plan and workup.  Eben Burowourtney A Forcucci, PA-C 09/23/14 1656  Flint MelterElliott L Wentz, MD 09/23/14 [phone]

## 2014-09-23 NOTE — Telephone Encounter (Signed)
Pt. Called to request a referral to neurology. Please f/u with pt  

## 2014-09-24 NOTE — Telephone Encounter (Signed)
Pt. Called to request a referral to neurology. Please f/u with pt

## 2014-09-26 ENCOUNTER — Telehealth: Payer: Self-pay

## 2014-09-26 NOTE — Telephone Encounter (Signed)
Attempted to reach patient Patient not available and no voice mail

## 2014-09-26 NOTE — Telephone Encounter (Signed)
Pt returning nurses call

## 2014-09-26 NOTE — Telephone Encounter (Signed)
-----   Message from Douglas Cheadleeepak Advani, MD sent at 09/03/2014  1:01 PM EDT ----- Call and let the patient know that his vitamin B 12 level is in normal range.

## 2014-09-27 ENCOUNTER — Telehealth: Payer: Self-pay | Admitting: Emergency Medicine

## 2014-09-27 NOTE — Telephone Encounter (Signed)
Attempted to reach pt with normal lab results;no answer

## 2014-10-07 ENCOUNTER — Emergency Department (INDEPENDENT_AMBULATORY_CARE_PROVIDER_SITE_OTHER)
Admission: EM | Admit: 2014-10-07 | Discharge: 2014-10-07 | Disposition: A | Discharge status: Home / Self Care | Payer: Self-pay | Source: Home / Self Care | Attending: Family Medicine | Admitting: Family Medicine

## 2014-10-07 ENCOUNTER — Encounter (HOSPITAL_COMMUNITY): Payer: Self-pay | Admitting: Emergency Medicine

## 2014-10-07 DIAGNOSIS — S46912A Strain of unspecified muscle, fascia and tendon at shoulder and upper arm level, left arm, initial encounter: Secondary | ICD-10-CM

## 2014-10-07 NOTE — Discharge Instructions (Signed)
Heat, stretch and ibuprofen

## 2014-10-07 NOTE — ED Provider Notes (Signed)
CSN: [id number]     Arrival date & time 10/07/14  1757 History   First MD Initiated Contact with Patient 10/07/14 1831     Chief Complaint  Patient presents with  . Back Pain   (Consider location/radiation/quality/duration/timing/severity/associated sxs/prior Treatment) Patient is a 33 y.o. male presenting with back pain. The history is provided by the patient.  Back Pain Location:  Thoracic spine Quality:  Shooting Radiates to:  L shoulder Pain severity:  Mild Onset quality:  Gradual Duration:  6 months Timing:  Constant Progression:  Unchanged Chronicity:  Chronic Context: not recent injury and not twisting   Context comment:  3rd er visit this mo for various c/o. Relieved by:  None tried Associated symptoms: no abdominal pain, no abdominal swelling, no chest pain, no fever, no numbness and no paresthesias     Past Medical History  Diagnosis Date  . Alcoholic fatty liver   . Hypertension    History reviewed. No pertinent past surgical history. Family History  Problem Relation Age of Onset  . Diabetes Mother   . Hypertension Mother   . Cancer Father     liver  . Hypertension Father   . Cancer Paternal Uncle     coon cancer    History  Substance Use Topics  . Smoking status: Current Some Day Smoker -- 0.25 packs/day for 14 years    Types: Cigarettes    Last Attempt to Quit: 10/24/2013  . Smokeless tobacco: Not on file  . Alcohol Use: No    Review of Systems  Constitutional: Negative.  Negative for fever.  Respiratory: Negative.   Cardiovascular: Negative.  Negative for chest pain.  Gastrointestinal: Negative.  Negative for abdominal pain.  Genitourinary: Negative.   Musculoskeletal: Positive for back pain.  Neurological: Negative for numbness and paresthesias.    Allergies  Review of patient's allergies indicates no known allergies.  Home Medications   Prior to Admission medications   Medication Sig Start Date End Date Taking? Authorizing Provider    gabapentin (NEURONTIN) 100 MG capsule Take 1 capsule (100 mg total) by mouth 3 (three) times daily. 09/02/14  Yes Doris Cheadleeepak Advani, MD  aspirin-acetaminophen-caffeine (EXCEDRIN MIGRAINE) 250-250-65 MG per tablet Take 2 tablets by mouth every 6 (six) hours as needed for headache (headache).    Historical Provider, MD  fluticasone (FLONASE) 50 MCG/ACT nasal spray Place 2 sprays into both nostrils daily. 09/23/14   Courtney A Forcucci, PA-C  meclizine (ANTIVERT) 25 MG tablet Take 1 tablet (25 mg total) by mouth 3 (three) times daily as needed for nausea. 09/02/14   Doris Cheadleeepak Advani, MD  methocarbamol (ROBAXIN) 500 MG tablet Take 1 tablet (500 mg total) by mouth 2 (two) times daily as needed for muscle spasms. 07/21/14   Sharlene MottsBenjamin W Cartner, PA-C  Multiple Vitamin (MULTIVITAMIN WITH MINERALS) TABS tablet Take 1 tablet by mouth every morning.    Historical Provider, MD  naproxen (NAPROSYN) 500 MG tablet Take 1 tablet (500 mg total) by mouth 2 (two) times daily. 08/30/14   Sharlene MottsBenjamin W Cartner, PA-C  Probiotic Product (ALIGN) 4 MG CAPS Take 4 mg by mouth every morning.     Historical Provider, MD  promethazine (PHENERGAN) 25 MG tablet Take 1 tablet (25 mg total) by mouth every 6 (six) hours as needed for nausea or vomiting. 09/16/14   Kaitlyn Szekalski, PA-C  traMADol (ULTRAM) 50 MG tablet Take 1 tablet (50 mg total) by mouth every 6 (six) hours as needed. 09/16/14   Emilia BeckKaitlyn Szekalski, PA-C  Vitamin  D, Ergocalciferol, (DRISDOL) 50000 UNITS CAPS capsule Take 1 capsule (50,000 Units total) by mouth every 7 (seven) days. 08/01/14   Quentin Angstlugbemiga E Jegede, MD   BP 155/96 mmHg  Pulse 80  Temp(Src) 98.2 F (36.8 C) (Oral)  Resp 14  SpO2 98% Physical Exam  Constitutional: He is oriented to person, place, and time. He appears well-developed and well-nourished.  Pulmonary/Chest: Effort normal and breath sounds normal. He exhibits no tenderness.  Abdominal: Soft.  Musculoskeletal:       Back:  Neurological: He is alert and  oriented to person, place, and time.  Skin: Skin is warm and dry.  Nursing note and vitals reviewed.   ED Course  Procedures (including critical care time) Labs Review Labs Reviewed - No data to display  Imaging Review No results found.   MDM   1. Muscle strain of left scapular region, initial encounter        Linna HoffJames D Shakima Nisley, MD 10/07/14 Ernestina [id number]

## 2014-10-07 NOTE — ED Notes (Signed)
Pain under left shoulder blade (every day) since march per patient .  Burning pain in back.

## 2014-11-09 ENCOUNTER — Encounter (HOSPITAL_COMMUNITY): Payer: Self-pay | Admitting: *Deleted

## 2014-11-09 ENCOUNTER — Emergency Department (HOSPITAL_COMMUNITY)
Admission: EM | Admit: 2014-11-09 | Discharge: 2014-11-09 | Disposition: A | Discharge status: Home / Self Care | Payer: Self-pay | Attending: Emergency Medicine | Admitting: Emergency Medicine

## 2014-11-09 ENCOUNTER — Emergency Department (HOSPITAL_COMMUNITY): Payer: Self-pay

## 2014-11-09 DIAGNOSIS — Z720 Tobacco use: Secondary | ICD-10-CM | POA: Insufficient documentation

## 2014-11-09 DIAGNOSIS — R195 Other fecal abnormalities: Secondary | ICD-10-CM | POA: Insufficient documentation

## 2014-11-09 DIAGNOSIS — R109 Unspecified abdominal pain: Secondary | ICD-10-CM

## 2014-11-09 DIAGNOSIS — R140 Abdominal distension (gaseous): Secondary | ICD-10-CM | POA: Insufficient documentation

## 2014-11-09 DIAGNOSIS — R51 Headache: Secondary | ICD-10-CM | POA: Insufficient documentation

## 2014-11-09 DIAGNOSIS — Z79899 Other long term (current) drug therapy: Secondary | ICD-10-CM | POA: Insufficient documentation

## 2014-11-09 DIAGNOSIS — I10 Essential (primary) hypertension: Secondary | ICD-10-CM | POA: Insufficient documentation

## 2014-11-09 DIAGNOSIS — K625 Hemorrhage of anus and rectum: Secondary | ICD-10-CM | POA: Insufficient documentation

## 2014-11-09 LAB — CBC WITH DIFFERENTIAL/PLATELET
Basophils Absolute: 0 10*3/uL (ref 0.0–0.1)
Basophils Relative: 0 % (ref 0–1)
EOS ABS: 0.1 10*3/uL (ref 0.0–0.7)
Eosinophils Relative: 2 % (ref 0–5)
HCT: 42 % (ref 39.0–52.0)
Hemoglobin: 14.4 g/dL (ref 13.0–17.0)
Lymphocytes Relative: 38 % (ref 12–46)
Lymphs Abs: 2.2 10*3/uL (ref 0.7–4.0)
MCH: 30 pg (ref 26.0–34.0)
MCHC: 34.3 g/dL (ref 30.0–36.0)
MCV: 87.5 fL (ref 78.0–100.0)
MONOS PCT: 7 % (ref 3–12)
Monocytes Absolute: 0.4 10*3/uL (ref 0.1–1.0)
NEUTROS ABS: 3.1 10*3/uL (ref 1.7–7.7)
Neutrophils Relative %: 53 % (ref 43–77)
Platelets: 190 10*3/uL (ref 150–400)
RBC: 4.8 MIL/uL (ref 4.22–5.81)
RDW: 12.6 % (ref 11.5–15.5)
WBC: 5.9 10*3/uL (ref 4.0–10.5)

## 2014-11-09 LAB — COMPREHENSIVE METABOLIC PANEL
ALK PHOS: 73 U/L (ref 39–117)
ALT: 78 U/L — ABNORMAL HIGH (ref 0–53)
ANION GAP: 6 (ref 5–15)
AST: 38 U/L — ABNORMAL HIGH (ref 0–37)
Albumin: 4.5 g/dL (ref 3.5–5.2)
BILIRUBIN TOTAL: 0.7 mg/dL (ref 0.3–1.2)
BUN: 16 mg/dL (ref 6–23)
CHLORIDE: 105 meq/L (ref 96–112)
CO2: 28 mmol/L (ref 19–32)
Calcium: 9.6 mg/dL (ref 8.4–10.5)
Creatinine, Ser: 1.01 mg/dL (ref 0.50–1.35)
Glucose, Bld: 76 mg/dL (ref 70–99)
POTASSIUM: 3.9 mmol/L (ref 3.5–5.1)
Sodium: 139 mmol/L (ref 135–145)
Total Protein: 8 g/dL (ref 6.0–8.3)

## 2014-11-09 LAB — URINALYSIS, ROUTINE W REFLEX MICROSCOPIC
BILIRUBIN URINE: NEGATIVE
Glucose, UA: NEGATIVE mg/dL
HGB URINE DIPSTICK: NEGATIVE
KETONES UR: NEGATIVE mg/dL
Leukocytes, UA: NEGATIVE
Nitrite: NEGATIVE
Protein, ur: NEGATIVE mg/dL
Specific Gravity, Urine: 1.013 (ref 1.005–1.030)
UROBILINOGEN UA: 0.2 mg/dL (ref 0.0–1.0)
pH: 6 (ref 5.0–8.0)

## 2014-11-09 LAB — POC OCCULT BLOOD, ED: Fecal Occult Bld: POSITIVE — AB

## 2014-11-09 MED ORDER — PANTOPRAZOLE SODIUM 40 MG IV SOLR
40.0000 mg | Freq: Once | INTRAVENOUS | Status: AC
  Administered 2014-11-09: 40 mg via INTRAVENOUS
  Filled 2014-11-09: qty 40

## 2014-11-09 MED ORDER — OMEPRAZOLE 20 MG PO CPDR: 20.0000 mg | DELAYED_RELEASE_CAPSULE | Freq: Every day | ORAL | Status: DC

## 2014-11-09 MED ORDER — IOHEXOL 300 MG/ML  SOLN
25.0000 mL | Freq: Once | INTRAMUSCULAR | Status: AC | PRN
  Administered 2014-11-09: 25 mL via ORAL

## 2014-11-09 MED ORDER — IOHEXOL 300 MG/ML  SOLN
100.0000 mL | Freq: Once | INTRAMUSCULAR | Status: AC | PRN
  Administered 2014-11-09: 100 mL via INTRAVENOUS

## 2014-11-09 NOTE — ED Notes (Signed)
Patient states that he has been having sharp pains to his left upper quadrant on and off for about a week. Nothing makes it better or worse. Patient states that he has had some blood in his stool this morning in the toilet. He states that this has happened in the past, but it was more blood today than it had been the last time this occurred. He did not seek medical treatment the last 2 times he saw blood in his stool.

## 2014-11-09 NOTE — Discharge Instructions (Signed)
1. Medications: Omeprazole, usual home medications 2. Treatment: rest, drink plenty of fluids, advance diet slowly, no alcohol usage, stop taking ibuprofen Aleve work at a powders 3. Follow Up: Please followup with your primary doctor in 2 days for discussion of your diagnoses and further evaluation after today's visit; if you do not have a primary care doctor use the resource guide provided to find one; Please return to the ER for persistent vomiting, high fevers or worsening symptoms    Bloody Stools Bloody stools often mean that there is a problem in the digestive tract. Your caregiver may use the term "melena" to describe black, tarry, and bad smelling stools or "hematochezia" to describe red or maroon-colored stools. Blood seen in the stool can be caused by bleeding anywhere along the intestinal tract.  A black stool usually means that blood is coming from the upper part of the gastrointestinal tract (esophagus, stomach, or small bowel). Passing maroon-colored stools or bright red blood usually means that blood is coming from lower down in the large bowel or the rectum. However, sometimes massive bleeding in the stomach or small intestine can cause bright red bloody stools.  Consuming black licorice, lead, iron pills, medicines containing bismuth subsalicylate, or blueberries can also cause black stools. Your caregiver can test black stools to see if blood is present. It is important that the cause of the bleeding be found. Treatment can then be started, and the problem can be corrected. Rectal bleeding may not be serious, but you should not assume everything is okay until you know the cause.It is very important to follow up with your caregiver or a specialist in gastrointestinal problems. CAUSES  Blood in the stools can come from various underlying causes.Often, the cause is not found during your first visit. Testing is often needed to discover the cause of bleeding in the gastrointestinal tract.  Causes range from simple to serious or even life-threatening.Possible causes include:  Hemorrhoids.These are veins that are full of blood (engorged) in the rectum. They cause pain, inflammation, and may bleed.  Anal fissures.These are areas of painful tearing which may bleed. They are often caused by passing hard stool.  Diverticulosis.These are pouches that form on the colon over time, with age, and may bleed significantly.  Diverticulitis.This is inflammation in areas with diverticulosis. It can cause pain, fever, and bloody stools, although bleeding is rare.  Proctitis and colitis. These are inflamed areas of the rectum or colon. They may cause pain, fever, and bloody stools.  Polyps and cancer. Colon cancer is a leading cause of preventable cancer death.It often starts out as precancerous polyps that can be removed during a colonoscopy, preventing progression into cancer. Sometimes, polyps and cancer may cause rectal bleeding.  Gastritis and ulcers.Bleeding from the upper gastrointestinal tract (near the stomach) may travel through the intestines and produce black, sometimes tarry, often bad smelling stools. In certain cases, if the bleeding is fast enough, the stools may not be black, but red and the condition may be life-threatening. SYMPTOMS  You may have stools that are bright red and bloody, that are normal color with blood on them, or that are dark black and tarry. In some cases, you may only have blood in the toilet bowl. Any of these cases need medical care. You may also have:  Pain at the anus or anywhere in the rectum.  Lightheadedness or feeling faint.  Extreme weakness.  Nausea or vomiting.  Fever. DIAGNOSIS Your caregiver may use the following methods to find  the cause of your bleeding:  Taking a medical history. Age is important. Older people tend to develop polyps and cancer more often. If there is anal pain and a hard, large stool associated with bleeding, a  tear of the anus may be the cause. If blood drips into the toilet after a bowel movement, bleeding hemorrhoids may be the problem. The color and frequency of the bleeding are additional considerations. In most cases, the medical history provides clues, but seldom the final answer.  A visual and finger (digital) exam. Your caregiver will inspect the anal area, looking for tears and hemorrhoids. A finger exam can provide information when there is tenderness or a growth inside. In men, the prostate is also examined.  Endoscopy. Several types of small, long scopes (endoscopes) are used to view the colon.  In the office, your caregiver may use a rigid, or more commonly, a flexible viewing sigmoidoscope. This exam is called flexible sigmoidoscopy. It is performed in 5 to 10 minutes.  A more thorough exam is accomplished with a colonoscope. It allows your caregiver to view the entire 5 to 6 foot long colon. Medicine to help you relax (sedative) is usually given for this exam. Frequently, a bleeding lesion may be present beyond the reach of the sigmoidoscope. So, a colonoscopy may be the best exam to start with. Both exams are usually done on an outpatient basis. This means the patient does not stay overnight in the hospital or surgery center.  An upper endoscopy may be needed to examine your stomach. Sedation is used and a flexible endoscope is put in your mouth, down to your stomach.  A barium enema X-ray. This is an X-ray exam. It uses liquid barium inserted by enema into the rectum. This test alone may not identify an actual bleeding point. X-rays highlight abnormal shadows, such as those made by lumps (tumors), diverticuli, or colitis. TREATMENT  Treatment depends on the cause of your bleeding.   For bleeding from the stomach or colon, the caregiver doing your endoscopy or colonoscopy may be able to stop the bleeding as part of the procedure.  Inflammation or infection of the colon can be treated with  medicines.  Many rectal problems can be treated with creams, suppositories, or warm baths.  Surgery is sometimes needed.  Blood transfusions are sometimes needed if you have lost a lot of blood.  For any bleeding problem, let your caregiver know if you take aspirin or other blood thinners regularly. HOME CARE INSTRUCTIONS   Take any medicines exactly as prescribed.  Keep your stools soft by eating a diet high in fiber. Prunes (1 to 3 a day) work well for many people.  Drink enough water and fluids to keep your urine clear or pale yellow.  Take sitz baths if advised. A sitz bath is when you sit in a bathtub with warm water for 10 to 15 minutes to soak, soothe, and cleanse the rectal area.  If enemas or suppositories are advised, be sure you know how to use them. Tell your caregiver if you have problems with this.  Monitor your bowel movements to look for signs of improvement or worsening. SEEK MEDICAL CARE IF:   You do not improve in the time expected.  Your condition worsens after initial improvement.  You develop any new symptoms. SEEK IMMEDIATE MEDICAL CARE IF:   You develop severe or prolonged rectal bleeding.  You vomit blood.  You feel weak or faint.  You have a fever. MAKE  SURE YOU:  Understand these instructions.  Will watch your condition.  Will get help right away if you are not doing well or get worse. Document Released: 10/22/2002 Document Revised: 01/24/2012 Document Reviewed: 03/19/2011 [facility] Patient Information 2015 [place], [place]. This information is not intended to replace advice given to you by your health care provider. Make sure you discuss any questions you have with your health care provider.    Emergency Department Resource Guide 1) Find a Doctor and Pay Out of Pocket Although you won't have to find out who is covered by your insurance plan, it is a good idea to ask around and get recommendations. You will then need to call the office and  see if the doctor you have chosen will accept you as a new patient and what types of options they offer for patients who are self-pay. Some doctors offer discounts or will set up payment plans for their patients who do not have insurance, but you will need to ask so you aren't surprised when you get to your appointment.  2) Contact Your Local Health Department Not all health departments have doctors that can see patients for sick visits, but many do, so it is worth a call to see if yours does. If you don't know where your local health department is, you can check in your phone book. The CDC also has a tool to help you locate your state's health department, and many state websites also have listings of all of their local health departments.  3) Find a Walk-in Clinic If your illness is not likely to be very severe or complicated, you may want to try a walk in clinic. These are popping up all over the country in pharmacies, drugstores, and shopping centers. They're usually staffed by nurse practitioners or physician assistants that have been trained to treat common illnesses and complaints. They're usually fairly quick and inexpensive. However, if you have serious medical issues or chronic medical problems, these are probably not your best option.  No Primary Care Doctor: - Call Health Connect at  [phone] - they can help you locate a primary care doctor that  accepts your insurance, provides certain services, etc. - Physician Referral Service- [phone]  Chronic Pain Problems: Organization         Address  Phone   Notes  [facility]  [phone] Patients need to be referred by their primary care doctor.   Medication Assistance: Organization         Address  Phone   Notes  [facility] [address] [place] [place], [place] [address] [phone] --Must be a resident of [facility] -- Must have NO insurance coverage whatsoever  (no Medicaid/ Medicare, etc.) -- The pt. MUST have a primary care doctor that directs their care regularly and follows them in the community   [facility]  [phone]   [employer]  [phone]    Agencies that provide inexpensive medical care: Organization         Address  Phone   Notes  [facility]  [phone]   [facility]    [phone]   [facility] [address] [place], [place] [address] [phone]   [facility] [address] [place] [phone]   Planned Parenthood    [phone]   [facility]    [phone]   [facility]  [address], [place] Phone:  [phone], Fax:  [phone] Hours of Operation:  9 am - 6 pm, M-F.  Also accepts Medicaid/Medicare and self-pay.  [facility]  [address], [place] Phone: [phone], Fax: [phone]. Hours of Operation:  8:30 am - 5:30 pm, M-F.  Also accepts Medicaid and self-pay.  [facility] [place] [address] [place] Phone: [phone]   [facility] [address] Natasha Bence [place], [place] [phone], Ext. [phone] Mondays & Thursdays: 7-9 AM.  First 15 patients are seen on a first come, first serve basis.    Medicaid-accepting [facility] Providers:  Organization         Address  Phone   Notes  [facility] [address],  [phone] Also accepts self-pay patients.  [facility] [address] Laurell Josephs [place], [place]  [phone]   [facility] [address], [place] [phone]   [facility] [address], [place] [phone]   Renaye Rakers [address], [place]   [phone] Only accepts [place] Access [place] patients after they have their name applied to their  card.   Self-Pay (no insurance) in [facility]:  Organization         Address  Phone   Notes  Sickle Cell Patients, [facility] [address] [place], [place] [phone]   [facility] [address] [place], [place] [phone]   [facility] [place]  [address], [place] [phone]   [facility]/Dr. Osei-Bonsu  [address], [place] or [address], [place] [phone] Phone number for both [place] and [place] locations is the same.  [facility] [address] [place] [phone]   [facility] [address] [place] or [address] [phone]   [facility] [address] [place] [phone], phone; [phone], fax Sees patients 1st and 3rd Saturday of every month.  Must not qualify for public or private insurance (i.e. Medicaid, Medicare, [place] Health Choice, Veterans' Benefits)  Household income should be no more than 200% of the poverty level The clinic cannot treat you if you are pregnant or think you are pregnant  Sexually transmitted diseases are not treated at the clinic.    Dental Care: Organization         Address  Phone  Notes  [facility] Department of [facility] [address] [place], [place] [phone] Accepts children up to age 13 who are enrolled in [place] or [place] Health Choice; pregnant women with a Medicaid card; and children who have applied for Medicaid or [place] Health Choice, but were declined, whose parents can pay a reduced fee at time of service.  [facility]  [address], [place] [phone] Accepts children up to age 47 who are enrolled in [place] or [place] Health Choice; pregnant women with a Medicaid card; and children who have applied for Medicaid or [place] Health  Choice, but were declined, whose parents can pay a reduced fee at time of service.  [facility]  [address]  Lynne Logan [phone] Patients are seen by appointment only. Walk-ins are not accepted. [facility] will see patients 57 years of age and older. Monday - Tuesday (8am-5pm) Most Wednesdays (8:30-5pm) $30 per visit, cash only  [facility] Adult [facility] Access [facility]  [address], [facility] [phone] Patients are seen by appointment only. Walk-ins are not accepted. [facility] will see patients 67 years of age and older. One Wednesday Evening (Monthly: Volunteer Based).  $30 per visit, cash only  [employer]  [phone] for adults; Children under age 7, call Graduate Pediatric Dentistry at [phone]. Children aged 38-14, please call [phone] to request a pediatric application.  Dental services are provided in all areas of dental care including fillings, crowns and bridges, complete and partial dentures, implants, gum treatment, root canals, and extractions. Preventive care is also provided. Treatment is provided to both adults and children. Patients are selected via a lottery and there is often a waiting list.   [facility] [address] [place]  [phone] [email or web address]   [facility] [address] [place], [place] [phone], Ext. [phone] Second and Fourth Thursday of each month, opens at 6:30 AM; Clinic ends at 9 AM.  Patients are seen on a first-come first-served basis, and a limited number are seen during each clinic.   [facility]  [address] Ether Griffins [place], [place] [phone]   Eligibility Requirements You must have lived in [place], [place], or [place] counties for at least the last three months.   You cannot be eligible for state or federal sponsored [place], including [employer], [place], or [employer].   You  generally cannot be eligible for healthcare insurance through your employer.    How to apply: Eligibility screenings are held every Tuesday and Wednesday afternoon from 1:00 pm until 4:00 pm. You do not need an appointment for the interview!  [facility] [address], [place], [place] [phone]   [facility] Health Department  [phone]   [facility] Health Department  [phone]   [facility] Health Department  [phone]    Behavioral Health Resources in the Community: Intensive Outpatient Programs Organization         Address  Phone  Notes  [facility] [address] [place], [place] [phone]   [facility] Outpatient [address] [place], [place] [phone]   [facility] [address], [place], [place]  [phone]   [facility] Mental Health [address]  [place], [place] [phone] or [phone]   Substance Abuse Resources Organization         Address  Phone  Notes  [facility]  [phone]   [facility]  [phone]   [facility]  [phone]   Floydene Flock  [phone]   Residential & Outpatient Substance Abuse Program  [phone]   Psychological Services Organization         Address  Phone  Notes  [facility] Health  [phone]   [facility]  [phone]   [facility] Mental Health [address] [place] [phone] or [phone]    Mobile Crisis Teams Organization         Address  Phone  Notes  [facility], Mobile Crisis Care Unit  [phone]   [facility]  [address] [place], [place] [phone]   Doristine Locks [phone]  [address] [place] [phone]    Self-Help/Support Groups Organization         Address  Phone             Notes  [facility] [place] - variety of support groups  336- I7437963 Call for  more information  Narcotics Anonymous (NA), [facility] [address], [employer] [place]  2 meetings at this location   Statistician         Address  Phone  Notes  [facility] [address],    [place]  [phone]   [facility]  [address] [place] [address], [place], [place] [phone]   [facility] [address] [place], [place] [phone] Admissions: 8am-3pm M-F  [facility] [address]    [place], [place] [phone]   [facility] [address] [place], [place], [place] [phone]   [facility] [address]  [place], [place] [phone]   [facility] - Intensive Outpatient [address] Laurell Josephs [address], [place], [place] [phone]   [facility] ([facility] [address] [place]  [place], [place] [phone] or [phone]   [facility] ([facility]) [address] [place], [place] [phone] Accepts Medicaid  [facility] [address],  [place] [phone] Substance Abuse/Addiction Treatment   [facility] Organization         Address  Phone  Notes  [facility]  [phone]   Angie Fava, PhD [address] Ervin Knack [place], [place]   [phone] or [phone]   [facility] Behavioral   [address] [place], [place] [phone]   [facility] [address] [place], [place] [phone] Insurance/Medicaid/sponsorship through [facility] and Families [address]                                    [place], [place] [phone] Therapy/tele-psych/case  [facility] [address], [place] [phone]    Dr. Lolly Mustache  [phone]   [facility]  [employer] [facility] Dept. 1) [address] [place] 2) [address], [place] 3)  [address], [place] [phone]  [phone]   [facility] Child Abuse Hotline (339)071-8005 or [phone] (After Hours)

## 2014-11-09 NOTE — ED Provider Notes (Signed)
CSN: [id number]     Arrival date & time 11/09/14  1658 History   First MD Initiated Contact with Patient 11/09/14 1847     Chief Complaint  Patient presents with  . GI Bleeding     (Consider location/radiation/quality/duration/timing/severity/associated sxs/prior Treatment) The history is provided by the patient and medical records. No language interpreter was used.    Douglas Wong is a 33 y.o. male  with a hx of alcoholic fatty liver, hypertension presents to the Emergency Department complaining of gradual, persistent, progressively worsening epigastric and LUQ abdominal pain and abdominal distention onset approximately 1 week ago.  Patient reports he has a history of heavy alcohol usage but had to stop because he began to have problems with his liver.  Patient reports he only occasionally drinks a glass of wine. He reports he's had a persistent headache for approximately 3 weeks has not bothered him however he is been consistently taking Aleve and ibuprofen. Patient denies symptoms of reflux.  Patient reports he had a bowel movement this morning without straining and noted bright red blood along with brown colored stool. He reports that prior red blood was also in the toilet. He denies constipation or straining with bowel movements. He denies melena or hematemesis. No aggravating or alleviating factors. Patient denies fever, chills, headache, neck pain, chest pain, shortness of breath, nausea, vomiting, diarrhea, weakness and dizziness, syncope, dysuria, hematuria.  Patient is not anticoagulated.   Past Medical History  Diagnosis Date  . Alcoholic fatty liver   . Hypertension    History reviewed. No pertinent past surgical history. Family History  Problem Relation Age of Onset  . Diabetes Mother   . Hypertension Mother   . Cancer Father     liver  . Hypertension Father   . Cancer Paternal Uncle     coon cancer    History  Substance Use Topics  . Smoking status: Current Some Day  Smoker -- 0.25 packs/day for 14 years    Types: Cigarettes    Last Attempt to Quit: 10/24/2013  . Smokeless tobacco: Not on file  . Alcohol Use: No    Review of Systems  Constitutional: Negative for fever, diaphoresis, appetite change, fatigue and unexpected weight change.  HENT: Negative for mouth sores and trouble swallowing.   Respiratory: Negative for cough, chest tightness, shortness of breath, wheezing and stridor.   Cardiovascular: Negative for chest pain and palpitations.  Gastrointestinal: Positive for abdominal pain and blood in stool. Negative for nausea, vomiting, diarrhea, constipation, abdominal distention and rectal pain.  Genitourinary: Negative for dysuria, urgency, frequency, hematuria, flank pain and difficulty urinating.  Musculoskeletal: Negative for back pain, neck pain and neck stiffness.  Skin: Negative for rash.  Neurological: Negative for weakness.  Hematological: Negative for adenopathy.  Psychiatric/Behavioral: Negative for confusion.  All other systems reviewed and are negative.     Allergies  Review of patient's allergies indicates no known allergies.  Home Medications   Prior to Admission medications   Medication Sig Start Date End Date Taking? Authorizing Provider  aspirin-acetaminophen-caffeine (EXCEDRIN MIGRAINE) [phone] MG per tablet Take 2 tablets by mouth every 6 (six) hours as needed for headache (headache).   Yes Historical Provider, MD  gabapentin (NEURONTIN) 100 MG capsule Take 1 capsule (100 mg total) by mouth 3 (three) times daily. 09/02/14  Yes Doris Cheadle, MD  Multiple Vitamin (MULTIVITAMIN WITH MINERALS) TABS tablet Take 1 tablet by mouth every morning.   Yes Historical Provider, MD  Probiotic Product (ALIGN) 4  MG CAPS Take 4 mg by mouth every morning.    Yes Historical Provider, MD  fluticasone (FLONASE) 50 MCG/ACT nasal spray Place 2 sprays into both nostrils daily. Patient not taking: Reported on 11/09/2014 09/23/14   Courtney A  Forcucci, PA-C  meclizine (ANTIVERT) 25 MG tablet Take 1 tablet (25 mg total) by mouth 3 (three) times daily as needed for nausea. Patient not taking: Reported on 11/09/2014 09/02/14   Doris Cheadleeepak Advani, MD  methocarbamol (ROBAXIN) 500 MG tablet Take 1 tablet (500 mg total) by mouth 2 (two) times daily as needed for muscle spasms. Patient not taking: Reported on 11/09/2014 07/21/14   Earle GellBenjamin W Cartner, PA-C  naproxen (NAPROSYN) 500 MG tablet Take 1 tablet (500 mg total) by mouth 2 (two) times daily. Patient not taking: Reported on 11/09/2014 08/30/14   Earle GellBenjamin W Cartner, PA-C  omeprazole (PRILOSEC) 20 MG capsule Take 1 capsule (20 mg total) by mouth daily. 11/09/14   Brice Potteiger, PA-C  promethazine (PHENERGAN) 25 MG tablet Take 1 tablet (25 mg total) by mouth every 6 (six) hours as needed for nausea or vomiting. Patient not taking: Reported on 11/09/2014 09/16/14   Emilia BeckKaitlyn Szekalski, PA-C  traMADol (ULTRAM) 50 MG tablet Take 1 tablet (50 mg total) by mouth every 6 (six) hours as needed. Patient not taking: Reported on 11/09/2014 09/16/14   Emilia BeckKaitlyn Szekalski, PA-C  Vitamin D, Ergocalciferol, (DRISDOL) 50000 UNITS CAPS capsule Take 1 capsule (50,000 Units total) by mouth every 7 (seven) days. Patient not taking: Reported on 11/09/2014 08/01/14   Quentin Angstlugbemiga E Jegede, MD   BP 125/77 mmHg  Pulse 71  Temp(Src) 98.5 F (36.9 C) (Oral)  Resp 12  SpO2 100% Physical Exam  Constitutional: He appears well-developed and well-nourished. No distress.  Awake, alert, nontoxic appearance  HENT:  Head: Normocephalic and atraumatic.  Mouth/Throat: Oropharynx is clear and moist. No oropharyngeal exudate.  Eyes: Conjunctivae are normal. No scleral icterus.  Neck: Normal range of motion. Neck supple.  Cardiovascular: Normal rate, regular rhythm, normal heart sounds and intact distal pulses.   No murmur heard. Pulmonary/Chest: Effort normal and breath sounds normal. No respiratory distress. He has no  wheezes.  Equal chest expansion  Abdominal: Soft. Bowel sounds are normal. He exhibits no distension and no mass. There is generalized tenderness. There is no rebound, no guarding and no CVA tenderness.  Generalized abdominal tenderness worse in the left upper quadrant and epigastric region Rebound or peritoneal signs His CVA tenderness  Genitourinary: Prostate normal. Rectal exam shows no external hemorrhoid, no fissure, no mass, no tenderness and anal tone normal. Guaiac positive stool. Prostate is not enlarged and not tender.  Normal anal tone, no mass or tenderness, no visible external hemorrhoids and no anal fissure Prostate is not enlarged or boggy.  Musculoskeletal: Normal range of motion. He exhibits no edema.  Neurological: He is alert.  Speech is clear and goal oriented Moves extremities without ataxia  Skin: Skin is warm and dry. He is not diaphoretic. No erythema.  Psychiatric: He has a normal mood and affect.  Nursing note and vitals reviewed.   ED Course  Procedures (including critical care time) Labs Review Labs Reviewed  COMPREHENSIVE METABOLIC PANEL - Abnormal; Notable for the following:    AST 38 (*)    ALT 78 (*)    All other components within normal limits  POC OCCULT BLOOD, ED - Abnormal; Notable for the following:    Fecal Occult Bld POSITIVE (*)    All other components within  normal limits  CBC WITH DIFFERENTIAL  URINALYSIS, ROUTINE W REFLEX MICROSCOPIC    Imaging Review Ct Abdomen Pelvis W Contrast  11/09/2014   CLINICAL DATA:  Sharp pains in the right upper quadrant. For one week. Some blood in stool.  EXAM: CT ABDOMEN AND PELVIS WITH CONTRAST  TECHNIQUE: Multidetector CT imaging of the abdomen and pelvis was performed using the standard protocol following bolus administration of intravenous contrast.  CONTRAST:  100mL OMNIPAQUE IOHEXOL 300 MG/ML SOLN, 25mL OMNIPAQUE IOHEXOL 300 MG/ML SOLN  COMPARISON:  CT abdomen 02/04/1999 15  FINDINGS: Lower chest:   Lungs bases are clear.  Hepatobiliary: No focal hepatic lesion. No biliary duct dilatation. Gallbladder is normal. Common bile duct is normal.  Pancreas: Pancreas is normal. No ductal dilatation. No pancreatic inflammation.  Spleen: Normal spleen.  Adrenals/urinary tract: Normal adrenal glands. Kidneys, ureters, and bladder are normal.  Stomach/Bowel: Stomach, small bowel, appendix, cecum are normal. Colon and rectosigmoid colon are normal.  Vascular/Lymphatic: Abdominal aorta is normal caliber. There is no retroperitoneal or periportal lymphadenopathy. No pelvic lymphadenopathy.  Reproductive: Prostate gland is normal. No pelvic lymphadenopathy. No free fluid the pelvis.  Musculoskeletal: No aggressive osseous lesion.  IMPRESSION: 1. Normal appendix. 2. Gallbladder appears normal. 3. No bowel abnormality.   Electronically Signed   By: Genevive BiStewart  Edmunds M.D.   On: 11/09/2014 23:02     EKG Interpretation None      MDM   Final diagnoses:  Abdominal pain  Abdominal distension  Rectal bleeding   Douglas Wong presents with complaints of bright red blood per rectum today. He reports several days of epigastric and left upper quadrant abdominal pain. On exam he is tender throughout, worst in the left upper quadrant and epigastric region.  Patient denies alcoholism but does report regular usage of NSAIDs. Likely gastritis however with accompanying peripheral blood per rectum will obtain labs and CT.  11:26 PM Labs reassuring without anemia or leukocytosis. Mild elevation in AST and ALT consistent with patient's history of fatty liver. Fecal occult positive. Urinalysis without evidence of urinary tract infection or blood. CT scan reassuring without acute abnormality. Patient reports that he will be able to find a primary care physician next week. I also recommended that he follow-up with GI for further evaluation. He started to return to the emergency department for dizziness, weakness, syncope, near-syncope  or large amounts of bright red blood per rectum.  I have personally reviewed patient's vitals, nursing note and any pertinent labs or imaging.  I performed an focused physical exam; undressed when appropriate .    It has been determined that no acute conditions requiring further emergency intervention are present at this time. The patient/guardian have been advised of the diagnosis and plan. I reviewed any labs and imaging including any potential incidental findings. We have discussed signs and symptoms that warrant return to the ED and they are listed in the discharge instructions.    Vital signs are stable at discharge.   BP 125/77 mmHg  Pulse 71  Temp(Src) 98.5 F (36.9 C) (Oral)  Resp 12  SpO2 100%        Dierdre ForthHannah Ebert Forrester, PA-C 11/09/14 [id number]  Raeford RazorStephen Kohut, MD 11/11/14 1452

## 2014-12-04 ENCOUNTER — Encounter (HOSPITAL_COMMUNITY): Payer: Self-pay | Admitting: *Deleted

## 2014-12-04 ENCOUNTER — Emergency Department (HOSPITAL_COMMUNITY)
Admission: EM | Admit: 2014-12-04 | Discharge: 2014-12-04 | Disposition: A | Discharge status: Home / Self Care | Payer: No Typology Code available for payment source | Attending: Emergency Medicine | Admitting: Emergency Medicine

## 2014-12-04 ENCOUNTER — Emergency Department (HOSPITAL_COMMUNITY): Payer: No Typology Code available for payment source

## 2014-12-04 DIAGNOSIS — Y990 Civilian activity done for income or pay: Secondary | ICD-10-CM | POA: Diagnosis not present

## 2014-12-04 DIAGNOSIS — I10 Essential (primary) hypertension: Secondary | ICD-10-CM | POA: Insufficient documentation

## 2014-12-04 DIAGNOSIS — M898X1 Other specified disorders of bone, shoulder: Secondary | ICD-10-CM

## 2014-12-04 DIAGNOSIS — Z8719 Personal history of other diseases of the digestive system: Secondary | ICD-10-CM | POA: Insufficient documentation

## 2014-12-04 DIAGNOSIS — Z7982 Long term (current) use of aspirin: Secondary | ICD-10-CM | POA: Insufficient documentation

## 2014-12-04 DIAGNOSIS — Z720 Tobacco use: Secondary | ICD-10-CM | POA: Insufficient documentation

## 2014-12-04 DIAGNOSIS — Y9389 Activity, other specified: Secondary | ICD-10-CM | POA: Diagnosis not present

## 2014-12-04 DIAGNOSIS — Y9289 Other specified places as the place of occurrence of the external cause: Secondary | ICD-10-CM | POA: Insufficient documentation

## 2014-12-04 DIAGNOSIS — S299XXA Unspecified injury of thorax, initial encounter: Secondary | ICD-10-CM | POA: Insufficient documentation

## 2014-12-04 DIAGNOSIS — Z79899 Other long term (current) drug therapy: Secondary | ICD-10-CM | POA: Insufficient documentation

## 2014-12-04 DIAGNOSIS — X58XXXA Exposure to other specified factors, initial encounter: Secondary | ICD-10-CM | POA: Insufficient documentation

## 2014-12-04 DIAGNOSIS — G8929 Other chronic pain: Secondary | ICD-10-CM

## 2014-12-04 MED ORDER — METHOCARBAMOL 500 MG PO TABS
500.0000 mg | ORAL_TABLET | Freq: Once | ORAL | Status: AC
  Administered 2014-12-04: 500 mg via ORAL
  Filled 2014-12-04: qty 1

## 2014-12-04 MED ORDER — METHOCARBAMOL 500 MG PO TABS: 500.0000 mg | ORAL_TABLET | Freq: Three times a day (TID) | ORAL | Status: DC

## 2014-12-04 MED ORDER — METHOCARBAMOL 500 MG PO TABS: 500.0000 mg | ORAL_TABLET | Freq: Two times a day (BID) | ORAL | Status: DC | PRN

## 2014-12-04 MED ORDER — TRAMADOL HCL 50 MG PO TABS: 50.0000 mg | ORAL_TABLET | Freq: Four times a day (QID) | ORAL | Status: DC | PRN

## 2014-12-04 NOTE — ED Notes (Signed)
Pt complains of pain in upper back. Pt states he felt a pop along with sharp, pulling pain while bending over today. Pt states the pain in his back radiates to his left chest intermittently.

## 2014-12-04 NOTE — ED Provider Notes (Signed)
CSN: [id number]     Arrival date & time 12/04/14  1950 History  This chart was scribed for non-physician practitioner, Earley Favor, NP-C working with Gwyneth Sprout, MD, by Abel Presto, ED Scribe. This patient was seen in room WTR9/WTR9 and the patient's care was started at 8:25 PM.      Chief Complaint  Patient presents with  . Back Pain    The history is provided by the patient. No language interpreter was used.    HPI Comments: Douglas Wong is a 34 y.o. male with PMHx of HTN, alcoholic fatty liver who presents to the Emergency Department complaining of sharp upper back pain with onset today around 3 PM.  Pt notes he was bending over at work and felt a pop.  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 Pt notes pain radiates from back to chest. Pt states he has strained the same muscle multiple times in the last year. Pt is right handed. Pt denies taking medication for relief.  Pt denies any other injury.   Past Medical History  Diagnosis Date  . Alcoholic fatty liver   . Hypertension    History reviewed. No pertinent past surgical history. Family History  Problem Relation Age of Onset  . Diabetes Mother   . Hypertension Mother   . Cancer Father     liver  . Hypertension Father   . Cancer Paternal Uncle     coon cancer    History  Substance Use Topics  . Smoking status: Current Some Day Smoker -- 0.25 packs/day for 14 years    Types: Cigarettes    Last Attempt to Quit: 10/24/2013  . Smokeless tobacco: Not on file  . Alcohol Use: No    Review of Systems  Cardiovascular: Positive for chest pain.  Musculoskeletal: Positive for back pain.  All other systems reviewed and are negative.     Allergies  Review of patient's allergies indicates no known allergies.  Home Medications   Prior to Admission medications   Medication Sig Start Date End Date Taking? Authorizing Provider  aspirin-acetaminophen-caffeine (EXCEDRIN MIGRAINE) [phone] MG per tablet Take 2 tablets by  mouth every 6 (six) hours as needed for headache (headache).   Yes Historical Provider, MD  gabapentin (NEURONTIN) 100 MG capsule Take 1 capsule (100 mg total) by mouth 3 (three) times daily. 09/02/14  Yes Doris Cheadle, MD  Multiple Vitamin (MULTIVITAMIN WITH MINERALS) TABS tablet Take 1 tablet by mouth every morning.   Yes Historical Provider, MD  omeprazole (PRILOSEC) 20 MG capsule Take 1 capsule (20 mg total) by mouth daily. 11/09/14  Yes Hannah Muthersbaugh, PA-C  OVER THE COUNTER MEDICATION Place 1 drop into the right ear daily as needed (pain.).   Yes Historical Provider, MD  fluticasone (FLONASE) 50 MCG/ACT nasal spray Place 2 sprays into both nostrils daily. Patient not taking: Reported on 11/09/2014 09/23/14   Courtney A Forcucci, PA-C  meclizine (ANTIVERT) 25 MG tablet Take 1 tablet (25 mg total) by mouth 3 (three) times daily as needed for nausea. Patient not taking: Reported on 11/09/2014 09/02/14   Doris Cheadle, MD  methocarbamol (ROBAXIN) 500 MG tablet Take 1 tablet (500 mg total) by mouth 3 (three) times daily. 12/04/14   Arman Filter, NP  methocarbamol (ROBAXIN) 500 MG tablet Take 1 tablet (500 mg total) by mouth 2 (two) times daily as needed for muscle spasms. 12/04/14   Arman Filter, NP  naproxen (NAPROSYN) 500 MG tablet Take 1 tablet (500 mg  total) by mouth 2 (two) times daily. Patient not taking: Reported on 11/09/2014 08/30/14   Earle GellBenjamin W Cartner, PA-C  promethazine (PHENERGAN) 25 MG tablet Take 1 tablet (25 mg total) by mouth every 6 (six) hours as needed for nausea or vomiting. Patient not taking: Reported on 11/09/2014 09/16/14   Emilia BeckKaitlyn Szekalski, PA-C  traMADol (ULTRAM) 50 MG tablet Take 1 tablet (50 mg total) by mouth every 6 (six) hours as needed. 12/04/14   Arman FilterGail K Safina Huard, NP  Vitamin D, Ergocalciferol, (DRISDOL) 50000 UNITS CAPS capsule Take 1 capsule (50,000 Units total) by mouth every 7 (seven) days. Patient not taking: Reported on 11/09/2014 08/01/14   Quentin Angstlugbemiga E  Jegede, MD   BP 139/80 mmHg  Pulse 81  Temp(Src) 98.4 F (36.9 C) (Oral)  Resp 20  SpO2 99% Physical Exam  Constitutional: He is oriented to person, place, and time. He appears well-developed and well-nourished.  HENT:  Head: Normocephalic.  Eyes: Conjunctivae are normal. Pupils are equal, round, and reactive to light.  Neck: Normal range of motion. Neck supple.  Cardiovascular: Normal rate.   Pulmonary/Chest: Effort normal. No respiratory distress. He has no wheezes.  Musculoskeletal: Normal range of motion.  Neurological: He is alert and oriented to person, place, and time.  Skin: Skin is warm and dry.  Psychiatric: He has a normal mood and affect. His behavior is normal.  Nursing note and vitals reviewed.   ED Course  Procedures (including critical care time) DIAGNOSTIC STUDIES: Oxygen Saturation is 100% on room air, normal by my interpretation.    COORDINATION OF CARE: 8:29 PM Discussed treatment plan with patient at beside, the patient agrees with the plan and has no further questions at this time.   Labs Review Labs Reviewed - No data to display  Imaging Review Dg Chest 2 View  12/04/2014   CLINICAL DATA:  34 year old male with a history of upper back pain.  EXAM: CHEST - 2 VIEW  COMPARISON:  Chest x-ray 02/02/2014.  FINDINGS: Cardiomediastinal silhouette projects within normal limits in size and contour. No confluent airspace disease, pneumothorax, or pleural effusion.  No displaced fracture.  Unremarkable appearance of the upper abdomen.  IMPRESSION: No radiographic evidence of acute cardiopulmonary disease.  Signed,  Yvone NeuJaime S. Loreta AveWagner, DO  [facility]  [facility]   Electronically Signed   By: Gilmer MorJaime  Wagner D.O.   On: 12/04/2014 20:47     EKG Interpretation None     Does not appear to be in distress  MDM  I personally performed the services described in this documentation, which was scribed in my presence. The recorded  information has been reviewed and is accurate.    Arman FilterGail K Lucca Ballo, NP 12/04/14 2106  Arman FilterGail K Brett Soza, NP 12/04/14 2107  Gwyneth SproutWhitney Plunkett, MD 12/05/14 2308

## 2014-12-04 NOTE — Discharge Instructions (Signed)
Call and make an appointment with the orthopedist for further evaluation and physical therapy

## 2014-12-11 ENCOUNTER — Encounter: Payer: Self-pay | Admitting: Neurology

## 2014-12-12 ENCOUNTER — Ambulatory Visit (INDEPENDENT_AMBULATORY_CARE_PROVIDER_SITE_OTHER): Payer: No Typology Code available for payment source | Admitting: Neurology

## 2014-12-12 ENCOUNTER — Encounter: Payer: Self-pay | Admitting: Neurology

## 2014-12-12 VITALS — BP 141/87 | HR 79 | Temp 97.8°F | Ht 71.0 in | Wt 213.0 lb

## 2014-12-12 DIAGNOSIS — F418 Other specified anxiety disorders: Secondary | ICD-10-CM

## 2014-12-12 DIAGNOSIS — R29818 Other symptoms and signs involving the nervous system: Secondary | ICD-10-CM

## 2014-12-12 DIAGNOSIS — F419 Anxiety disorder, unspecified: Secondary | ICD-10-CM

## 2014-12-12 DIAGNOSIS — R41840 Attention and concentration deficit: Secondary | ICD-10-CM

## 2014-12-12 DIAGNOSIS — F329 Major depressive disorder, single episode, unspecified: Secondary | ICD-10-CM

## 2014-12-12 DIAGNOSIS — R258 Other abnormal involuntary movements: Secondary | ICD-10-CM

## 2014-12-12 DIAGNOSIS — R253 Fasciculation: Secondary | ICD-10-CM

## 2014-12-12 DIAGNOSIS — R2689 Other abnormalities of gait and mobility: Secondary | ICD-10-CM

## 2014-12-12 DIAGNOSIS — R51 Headache: Secondary | ICD-10-CM

## 2014-12-12 DIAGNOSIS — R519 Headache, unspecified: Secondary | ICD-10-CM

## 2014-12-12 NOTE — Progress Notes (Signed)
Subjective:    Patient ID: Douglas Wong is a 34 y.o. male.  HPI     Douglas Foley, MD, PhD [facility] [address] [address] [place], [place] [address]  Dear Douglas Wong,   I saw your patient, [name], upon your kind request in my neurologic clinic today for initial consultation of abnormal involuntary movements, concern for myoclonus. The patient is unaccompanied today. As you know, Mr. Douglas Wong is a 34 year old right-handed gentleman with an underlying medical history of smoking, anxiety and depression (on Prozac as a teenager), alcoholic fatty liver, and reflux disease, who reports a 2 year history of muscle twitching in various areas of his body, including thighs, shoulder areas and around the eyes, especially around the L eye in the last 4 months. He has not noted any muscle atrophy, no permanent or progressive weakness, or numbness. In addition, he reports several other issues for the past months or years including recurrent headaches in the right frontal area, feeling of dizziness and balance problems, feeling lightheaded and faint, concentration problems, focusing problems, feeling "out of it", depressive symptoms and anxiety issues. He has a history of migraines since he was a child he states. He has taken Excedrin Migraine for this. He has some associated phono and sonophobia but no nausea or vomiting. Headaches are particularly common in the right frontal area.  He was diagnosed and treated for depression when he was a teenager and was on Prozac at the time but did not continue the medication and did not go back to his doctor. He has not yet talked to his primary care physician about his anxiety, depression and focus problems he says. He denies a family history of ALS/Lou Gehrig's disease, myasthenia gravis, neuropathy or tic disorder or Tourette's syndrome. His symptoms come and go. Currently he does not feel any twitching. It can be on a day-to-day basis.  Twitching lasts for seconds at a time. It may return in the same area go to another muscle area. He has been less good with his exercise routine. He tries to drink water. He does not drink a lot of sodas or caffeine at this time. He may drink a soda every other week or so. He quit drinking alcohol altogether in March of last year. He was drinking regularly but was never considered a heavy drinker. He was drinking 1-2 beers per day typically at the time. He was told that he had either alcoholic fatty liver or fatty liver from taking too much Tylenol. He was taking Tylenol every day because of toothaches at the time. He does not take any illicit drugs and does not have a history of illicit drug use. He lives with his wife and 2 children. He has 2 other children who live with their mothers. He works 2 jobs. He does endorse some stress. He typically goes to bed around 1 AM and has a wake time of 7:30 AM. He estimates he gets about 6 hours of sleep on an average. For reflux symptoms he has been taking Prilosec OTC. He snores but denies any apneic pauses or gasping sensations while asleep. He denies suicidal or homicidal ideations.  I reviewed his electronic chart. In the past year and a half he had numerous emergency room and urgent care visits for various complaints including headaches, back pain, abdominal pain, sore throat, panic attack, numbness and tingling, twitching around his eyes as well as chest pains. I reviewed pertinent lab results which included normal vitamin  B12 level on 09/02/2014, normal hemoglobin A1c on 07/31/2014, normal TSH, vitamin D level was low on 07/31/2014 at 17, on 08/15/2014 he had negative hepatitis screen for hepatitis A,  B and C, negative HIV, negative ANA.  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  He was advised that there is typically no good treatment for this problem and it may resolve over time he was advised to avoid caffeine and nicotine.  His Past Medical History Is Significant For: Past Medical History  Diagnosis Date  . Alcoholic fatty liver   . Hypertension     His Past Surgical History Is Significant For: History reviewed. No pertinent past surgical history.  His Family History Is Significant For: Family History  Problem Relation Age of Onset  . Diabetes Mother   . Hypertension Mother   . Cancer Father     liver  . Hypertension Father   . Cancer Paternal Uncle     coon cancer     His Social History Is Significant For: History   Social History  . Marital Status: Married    Spouse Name: Heide Guile    Number of Children: 2  . Years of Education: GED   Occupational History  .      [employer]   Social History Main Topics  . Smoking status: Current Some Day Smoker -- 0.25 packs/day for 14 years    Types: Cigarettes    Last Attempt to Quit: 10/24/2013  . Smokeless tobacco: Never Used  . Alcohol Use: No  . Drug Use: No  . Sexual Activity: None   Other Topics Concern  . None   Social History Narrative   Consumes caffeine occas.    His Allergies Are:  No Known Allergies:   His Current Medications Are:  Outpatient Encounter Prescriptions as of 12/12/2014  Medication Sig  . methocarbamol (ROBAXIN) 500 MG tablet Take 1 tablet (500 mg total) by mouth 2 (two) times daily as needed for muscle spasms.  . Multiple Vitamin (MULTIVITAMIN WITH MINERALS) TABS tablet Take 1 tablet by mouth every morning.  Marland Kitchen omeprazole (PRILOSEC) 20 MG capsule Take 1 capsule (20 mg total) by mouth daily.  Marland Kitchen OVER THE COUNTER MEDICATION Place 1 drop into the right ear daily as needed (pain.).  Marland Kitchen promethazine (PHENERGAN) 25 MG tablet Take 1 tablet (25 mg total) by mouth every 6 (six) hours as needed for nausea or vomiting.  . traMADol (ULTRAM) 50 MG tablet Take 1 tablet (50 mg  total) by mouth every 6 (six) hours as needed.  . Vitamin D, Ergocalciferol, (DRISDOL) 50000 UNITS CAPS capsule Take 1 capsule (50,000 Units total) by mouth every 7 (seven) days.  . [DISCONTINUED] aspirin-acetaminophen-caffeine (EXCEDRIN MIGRAINE) 250-250-65 MG per tablet Take 2 tablets by mouth every 6 (six) hours as needed for headache (headache).  . [DISCONTINUED] fluticasone (FLONASE) 50 MCG/ACT nasal spray Place 2 sprays into both nostrils daily. (Patient not taking: Reported on 12/12/2014)  . [DISCONTINUED] gabapentin (NEURONTIN) 100 MG capsule Take 1 capsule (100 mg total) by mouth 3 (three) times daily. (Patient not taking: Reported on 12/12/2014)  . [DISCONTINUED] meclizine (ANTIVERT) 25 MG tablet Take 1 tablet (25 mg total) by mouth 3 (three) times daily as needed for nausea. (Patient not taking: Reported on 12/12/2014)  . [DISCONTINUED] methocarbamol (ROBAXIN) 500 MG  tablet Take 1 tablet (500 mg total) by mouth 3 (three) times daily. (Patient not taking: Reported on 12/12/2014)  . [DISCONTINUED] naproxen (NAPROSYN) 500 MG tablet Take 1 tablet (500 mg total) by mouth 2 (two) times daily. (Patient not taking: Reported on 12/12/2014)  :   Review of Systems:  Out of a complete 14 point review of systems, all are reviewed and negative with the exception of these symptoms as listed below:  Review of Systems  Cardiovascular: Positive for palpitations.  Neurological:       Slurred speech  Psychiatric/Behavioral: Positive for confusion.       Anxiety    Objective:  Neurologic Exam  Physical Exam Physical Examination:   Filed Vitals:   12/12/14 1311  BP: 141/87  Pulse: 79  Temp: 97.8 F (36.6 C)   General Examination: The patient is a 34 y.o. male in no acute distress. He appears well-developed and well-nourished and well groomed.   HEENT: Normocephalic, atraumatic, pupils are equal, round and reactive to light and accommodation. Funduscopic exam is normal with sharp disc margins  noted. I did not see any twitching around his eyes. He has no abnormal involuntary movements in his face. Extraocular tracking is good without limitation to gaze excursion or nystagmus noted. Normal smooth pursuit is noted. Hearing is grossly intact. Tympanic membranes are clear bilaterally. Face is symmetric with normal facial animation and normal facial sensation. Speech is clear with no dysarthria noted. There is no hypophonia. There is no lip, neck/head, jaw or voice tremor. Neck is supple with full range of passive and active motion. There are no carotid bruits on auscultation. Oropharynx exam reveals: mild mouth dryness, good dental hygiene and mild airway crowding. Mallampati is class I.   Chest: Clear to auscultation without wheezing, rhonchi or crackles noted.  Heart: S1+S2+0, regular and normal without murmurs, rubs or gallops noted.   Abdomen: Soft, non-tender and non-distended with normal bowel sounds appreciated on auscultation.  Extremities: There is no pitting edema in the distal lower extremities bilaterally. Pedal pulses are intact.  Skin: Warm and dry without trophic changes noted. There are no varicose veins.  Musculoskeletal: exam reveals no obvious joint deformities, tenderness or joint swelling or erythema.   Neurologically:  Mental status: The patient is awake, alert and oriented in all 4 spheres. His immediate and remote memory, attention, language skills and fund of knowledge are appropriate. There is no evidence of aphasia, agnosia, apraxia or anomia. Speech is clear with normal prosody and enunciation. Thought process is linear. Mood is decreased range and affect is blunted.  Cranial nerves II - XII are as described above under HEENT exam. In addition: shoulder shrug is normal with equal shoulder height noted. Motor exam: Normal bulk, strength and tone is noted. In particular, there is no atrophy, no fasciculations are noted and no myoclonus or athetoid movements, no  dystonic posturing. He has no pain on palpation of his arms and legs.  There is no drift, tremor or rebound. Romberg is negative. Reflexes are 2+ throughout. Babinski: Toes are flexor bilaterally. Fine motor skills and coordination: intact with normal finger taps, normal hand movements, normal rapid alternating patting, normal foot taps and normal foot agility.  Cerebellar testing: No dysmetria or intention tremor on finger to nose testing. Heel to shin is unremarkable bilaterally. There is no truncal or gait ataxia.  Sensory exam: intact to light touch, pinprick, vibration, temperature sense in the upper and lower extremities.  Gait, station and balance: He stands easily.  No veering to one side is noted. No leaning to one side is noted. Posture is age-appropriate and stance is narrow based. Gait shows normal stride length and normal pace. No problems turning are noted. He turns en bloc. Tandem walk is unremarkable. Intact toe and heel stance is noted.               Assessment and Plan:   In summary, Eloise Levelslex T Levitz is a very pleasant 34 y.o.-year old male with an underlying medical history of smoking, anxiety and depression (on Prozac as a teenager), alcoholic fatty liver, and reflux disease, who reports a 2 year history of muscle twitching in various areas of his body, including thighs, shoulder areas and around the eyes, especially around the L eye in the last 4 months. In addition, he has several other more vague complaints. He has a history of recurrent headaches since childhood. He reports problems focusing, problems with attention, problems with his balance, presyncopal symptoms, depressive and anxiety symptoms as well. I cannot tie in all of his symptoms together at this time. He is advised to talk to his primary care physician about his anxiety, depression, problems with attention and focus. His physical and particularly his neurological exam are completely nonfocal at this time and he is reassured in  that regard. Nevertheless, I will go ahead and do some additional blood work and I would also like to do a brain MRI without contrast. We will call him with his test results. I advised him that there is no specific medication that I would recommend at this time.  I asked him to stay well-hydrated and make sure he does get enough rest and enough sleep time. Sometimes sleep deprivation can cause muscle twitching and electrolyte disturbance can also cause intermittent vague fasciculations that are not of any other sinister consequence. At this juncture, I would see him back routinely for checkup in a couple months and we will keep them posted over the phone as far as his test results go. I answered all his questions today and he was in agreement.   Thank you very much for allowing me to participate in the care of this nice patient. If I can be of any further assistance to you please do not hesitate to call me at [phone].  Sincerely,   Douglas FoleySaima Yolando Gillum, MD, PhD

## 2014-12-12 NOTE — Patient Instructions (Signed)
Your neurological exam is normal, which is very reassuring. We will do some more testing, including blood work and an MRI brain for completion. I will see you in about 3 months, and we will keep you posted on the phone as far as your test results go.  Pls make an appointment with your primary doctor for your anxiety, depression, focus and attention problems.

## 2014-12-13 LAB — CBC WITH DIFFERENTIAL/PLATELET
Basophils Absolute: 0 10*3/uL (ref 0.0–0.2)
Basos: 0 %
EOS: 2 %
Eosinophils Absolute: 0.1 10*3/uL (ref 0.0–0.4)
HCT: 41.9 % (ref 37.5–51.0)
HEMOGLOBIN: 14.8 g/dL (ref 12.6–17.7)
IMMATURE GRANS (ABS): 0 10*3/uL (ref 0.0–0.1)
Immature Granulocytes: 0 %
Lymphocytes Absolute: 2.2 10*3/uL (ref 0.7–3.1)
Lymphs: 47 %
MCH: 30.2 pg (ref 26.6–33.0)
MCHC: 35.3 g/dL (ref 31.5–35.7)
MCV: 86 fL (ref 79–97)
Monocytes Absolute: 0.4 10*3/uL (ref 0.1–0.9)
Monocytes: 8 %
Neutrophils Absolute: 2.1 10*3/uL (ref 1.4–7.0)
Neutrophils Relative %: 43 %
Platelets: 203 10*3/uL (ref 150–379)
RBC: 4.9 x10E6/uL (ref 4.14–5.80)
RDW: 14 % (ref 12.3–15.4)
WBC: 4.8 10*3/uL (ref 3.4–10.8)

## 2014-12-13 LAB — SPECIMEN STATUS REPORT

## 2014-12-16 ENCOUNTER — Telehealth: Payer: Self-pay | Admitting: Neurology

## 2014-12-16 NOTE — Progress Notes (Signed)
Quick Note:  Labs are for the most part normal. However, vitamin B12 and B6 are elevated which indicates that he may be taking a vitamin B supplement. I would recommend that he stop it. I would recommend that he follow-up with his primary care physician. Vitamin D was borderline low but improved from before. He can continue with an over-the-counter vitamin D supplement and get his vitamin D level rechecked by his primary care physician in the next 3-6 months or so. He should to get his liver function rechecked at the time as well because one of his liver enzymes was mildly up, called ALT. Muscle enzymes called CK were mildly elevated. If his twitching is no better after he stops his vitamin B supplement and continues with his vitamin D supplement and the next couple of months, I would like for him to call back for an update. We will consider an electrical nerve and muscle test called EMG nerve conduction study at the time. Please have them keep me posted over the phone in 2 months. He can also email us back. Huston FoleySaima Kasen Adduci, MD, PhD [facility] ([facility]  ______

## 2014-12-16 NOTE — Telephone Encounter (Signed)
John/[facility] needs correct diagnosis code for ref [id number].  Please call

## 2014-12-17 NOTE — Telephone Encounter (Signed)
Called John at [facility] to get fax # where the code can be faxed,lt Vm message

## 2014-12-18 ENCOUNTER — Ambulatory Visit
Admission: RE | Admit: 2014-12-18 | Discharge: 2014-12-18 | Disposition: A | Discharge status: Home / Self Care | Payer: No Typology Code available for payment source | Source: Ambulatory Visit | Attending: Neurology | Admitting: Neurology

## 2014-12-18 DIAGNOSIS — R519 Headache, unspecified: Secondary | ICD-10-CM

## 2014-12-18 DIAGNOSIS — R51 Headache: Principal | ICD-10-CM

## 2014-12-18 DIAGNOSIS — R2689 Other abnormalities of gait and mobility: Secondary | ICD-10-CM

## 2014-12-18 DIAGNOSIS — R29818 Other symptoms and signs involving the nervous system: Secondary | ICD-10-CM

## 2014-12-18 DIAGNOSIS — R41840 Attention and concentration deficit: Secondary | ICD-10-CM

## 2014-12-18 DIAGNOSIS — F419 Anxiety disorder, unspecified: Secondary | ICD-10-CM

## 2014-12-18 DIAGNOSIS — F329 Major depressive disorder, single episode, unspecified: Secondary | ICD-10-CM

## 2014-12-18 DIAGNOSIS — R253 Fasciculation: Secondary | ICD-10-CM

## 2014-12-18 LAB — COMPREHENSIVE METABOLIC PANEL
ALBUMIN: 5 g/dL (ref 3.5–5.5)
ALK PHOS: 86 IU/L (ref 39–117)
ALT: 94 IU/L — ABNORMAL HIGH (ref 0–44)
AST: 36 IU/L (ref 0–40)
Albumin/Globulin Ratio: 1.7 (ref 1.1–2.5)
BUN/Creatinine Ratio: 15 (ref 8–19)
BUN: 16 mg/dL (ref 6–20)
CO2: 25 mmol/L (ref 18–29)
Calcium: 10 mg/dL (ref 8.7–10.2)
Chloride: 100 mmol/L (ref 97–108)
Creatinine, Ser: 1.08 mg/dL (ref 0.76–1.27)
GFR calc non Af Amer: 91 mL/min/{1.73_m2} (ref >59)
GFR, EST AFRICAN AMERICAN: 105 mL/min/{1.73_m2} (ref >59)
GLOBULIN, TOTAL: 3 g/dL (ref 1.5–4.5)
Glucose: 93 mg/dL (ref 65–99)
POTASSIUM: 4.4 mmol/L (ref 3.5–5.2)
SODIUM: 142 mmol/L (ref 134–144)
TOTAL PROTEIN: 8 g/dL (ref 6.0–8.5)
Total Bilirubin: 0.4 mg/dL (ref 0.0–1.2)

## 2014-12-18 LAB — VITAMIN B1, WHOLE BLOOD: THIAMINE: 110.4 nmol/L (ref 66.5–200.0)

## 2014-12-18 LAB — TSH: TSH: 1.21 u[IU]/mL (ref 0.450–4.500)

## 2014-12-18 LAB — HGB A1C W/O EAG: HEMOGLOBIN A1C: 5.6 % (ref 4.8–5.6)

## 2014-12-18 LAB — METHYLMALONIC ACID, SERUM: METHYLMALONIC ACID: 185 nmol/L (ref 0–378)

## 2014-12-18 LAB — VITAMIN D 25 HYDROXY (VIT D DEFICIENCY, FRACTURES): Vit D, 25-Hydroxy: 28.6 ng/mL — ABNORMAL LOW (ref 30.0–100.0)

## 2014-12-18 LAB — ANA W/REFLEX: Anti Nuclear Antibody(ANA): NEGATIVE

## 2014-12-18 LAB — RPR: RPR: NONREACTIVE

## 2014-12-18 LAB — CK: CK TOTAL: 410 U/L — AB (ref 24–204)

## 2014-12-18 LAB — B12 AND FOLATE PANEL
FOLATE: 14.9 ng/mL (ref >3.0)
VITAMIN B 12: 1037 pg/mL — AB (ref 211–946)

## 2014-12-18 LAB — COPPER, SERUM: Copper: 100 ug/dL (ref 72–166)

## 2014-12-18 LAB — C-REACTIVE PROTEIN: CRP: 3.6 mg/L (ref 0.0–4.9)

## 2014-12-18 LAB — VITAMIN B6: Vitamin B6: 61.9 ug/L — ABNORMAL HIGH (ref 5.3–46.7)

## 2014-12-18 LAB — CERULOPLASMIN: Ceruloplasmin: 21.4 mg/dL (ref 16.0–31.0)

## 2014-12-18 NOTE — Telephone Encounter (Signed)
Gave diagnosis code to Douglas Wong ([facility], he verbalized understanding

## 2014-12-19 NOTE — Telephone Encounter (Signed)
Patient calling for MRI results, results were abnormal, Dr Frances FurbishAthar please call patient with results.

## 2014-12-19 NOTE — Progress Notes (Signed)
Quick Note:  Please call patient regarding the recent brain MRI: The brain scan showed a normal structure of the brain and no volume loss or what we call atrophy. There were changes in the deeper structures of the brain, which we call white matter changes or microvascular changes. These were reported as very mild in His case. These are tiny white spots, that occur with time and are seen in a variety of conditions, including with normal aging, chronic hypertension, chronic headaches, especially migraine HAs, chronic diabetes, chronic hyperlipidemia. These are not strokes and no mass or lesion was seen which is reassuring. Again, there were no acute findings, such as a stroke, or mass or blood products. No further action is required on this test at this time, other than re-enforcing the importance of good blood pressure control, good cholesterol control, good blood sugar control, and weight management. Please remind patient to keep any upcoming appointments or tests and to call us with any interim questions, concerns, problems or updates. Thanks,  Huston FoleySaima Gianmarco Roye, MD, PhD    ______

## 2014-12-24 ENCOUNTER — Other Ambulatory Visit: Payer: Self-pay | Admitting: Internal Medicine

## 2015-02-12 ENCOUNTER — Encounter (HOSPITAL_COMMUNITY): Payer: Self-pay | Admitting: *Deleted

## 2015-02-12 ENCOUNTER — Emergency Department (HOSPITAL_COMMUNITY)
Admission: EM | Admit: 2015-02-12 | Discharge: 2015-02-12 | Disposition: A | Discharge status: Home / Self Care | Payer: No Typology Code available for payment source | Source: Home / Self Care | Attending: Family Medicine | Admitting: Family Medicine

## 2015-02-12 DIAGNOSIS — S80821A Blister (nonthermal), right lower leg, initial encounter: Secondary | ICD-10-CM | POA: Diagnosis not present

## 2015-02-12 NOTE — Discharge Instructions (Signed)
    Blisters Blisters are fluid-filled sacs that form within the skin. Common causes of blistering are friction, burns, and exposure to irritating chemicals. The fluid in the blister protects the underlying damaged skin. Most of the time it is not recommended that you open blisters. When a blister is opened, there is an increased chance for infection. Usually, a blister will open on its own. They then dry up and peel off within 10 days. If the blister is tense and uncomfortable (painful) the fluid may be drained. If it is drained the roof of the blister should be left intact. The draining should only be done by a medical professional under aseptic conditions. Poorly fitting shoes and boots can cause blisters by being too tight or too loose. Wearing extra socks or using tape, bandages, or pads over the blister-prone area helps prevent the problem by reducing friction. Blisters heal more slowly if you have diabetes or if you have problems with your circulation. You need to be careful about medical follow-up to prevent infection. HOME CARE INSTRUCTIONS  Protect areas where blisters have formed until the skin is healed. Use a special bandage with a hole cut in the middle around the blister. This reduces pressure and friction. When the blister breaks, trim off the loose skin and keep the area clean by washing it with soap daily. Soaking the blister or broken-open blister with diluted vinegar twice daily for 15 minutes will dry it up and speed the healing. Use 3 tablespoons of white vinegar per quart of water (45 mL white vinegar per liter of water). An antibiotic ointment and a bandage can be used to cover the area after soaking.  SEEK MEDICAL CARE IF:   You develop increased redness, pain, swelling, or drainage in the blistered area.  You develop a pus-like discharge from the blistered area, chills, or a fever. MAKE SURE YOU:   Understand these instructions.  Will watch your condition.  Will get help  right away if you are not doing well or get worse. Document Released: 12/09/2004 Document Revised: 01/24/2012 Document Reviewed: 11/06/2008 ExitCare Patient Information 2015 [employer]. This information is not intended to replace advice given to you by your health care provider. Make sure you discuss any questions you have with your health care provider.  

## 2015-02-12 NOTE — ED Notes (Signed)
Pt  Has  A   Swollen  Draining      Lesion to  Back of     r leg         Noticed  Last  Pm    The  Lesion  Is  Leaking  It  Appears       Almost  As  A  Blister

## 2015-02-12 NOTE — ED Provider Notes (Signed)
CSN: [id number]     Arrival date & time 02/12/15  1329 History   First MD Initiated Contact with Patient 02/12/15 1422     Chief Complaint  Patient presents with  . Leg Swelling   (Consider location/radiation/quality/duration/timing/severity/associated sxs/prior Treatment) HPI Comments: 34 year old male noticed a blister in the right posterior thigh last night. At some point it ruptured and drained clear fluid. Today it feels sore but there is no additional drainage.   Past Medical History  Diagnosis Date  . Alcoholic fatty liver   . Hypertension    History reviewed. No pertinent past surgical history. Family History  Problem Relation Age of Onset  . Diabetes Mother   . Hypertension Mother   . Cancer Father     liver  . Hypertension Father   . Cancer Paternal Uncle     coon cancer    History  Substance Use Topics  . Smoking status: Current Some Day Smoker -- 0.25 packs/day for 14 years    Types: Cigarettes    Last Attempt to Quit: 10/24/2013  . Smokeless tobacco: Never Used  . Alcohol Use: No    Review of Systems  Constitutional: Negative.   Skin: Positive for wound. Negative for color change, pallor and rash.  All other systems reviewed and are negative.   Allergies  Review of patient's allergies indicates no known allergies.  Home Medications   Prior to Admission medications   Medication Sig Start Date End Date Taking? Authorizing Provider  methocarbamol (ROBAXIN) 500 MG tablet Take 1 tablet (500 mg total) by mouth 2 (two) times daily as needed for muscle spasms. 12/04/14   Earley Favor, NP  Multiple Vitamin (MULTIVITAMIN WITH MINERALS) TABS tablet Take 1 tablet by mouth every morning.    Historical Provider, MD  omeprazole (PRILOSEC) 20 MG capsule Take 1 capsule (20 mg total) by mouth daily. 11/09/14   Hannah Muthersbaugh, PA-C  OVER THE COUNTER MEDICATION Place 1 drop into the right ear daily as needed (pain.).    Historical Provider, MD  promethazine  (PHENERGAN) 25 MG tablet Take 1 tablet (25 mg total) by mouth every 6 (six) hours as needed for nausea or vomiting. 09/16/14   Kaitlyn Szekalski, PA-C  traMADol (ULTRAM) 50 MG tablet Take 1 tablet (50 mg total) by mouth every 6 (six) hours as needed. 12/04/14   Earley Favor, NP  Vitamin D, Ergocalciferol, (DRISDOL) 50000 UNITS CAPS capsule Take 1 capsule (50,000 Units total) by mouth every 7 (seven) days. 08/01/14   Quentin Angst, MD   BP 135/83 mmHg  Pulse 67  Temp(Src) 98.4 F (36.9 C) (Oral)  Resp 16  SpO2 100% Physical Exam  Constitutional: He is oriented to person, place, and time. He appears well-developed and well-nourished. No distress.  Pulmonary/Chest: Effort normal. No respiratory distress.  Musculoskeletal: He exhibits no edema or tenderness.  Neurological: He is alert and oriented to person, place, and time.  Skin: Skin is warm and dry.  Superficial ruptured vesicle to the posterior right thigh. No erythema, drainage, lymphangitis, induration or signs of infection.  Psychiatric: He has a normal mood and affect.  Nursing note and vitals reviewed.   ED Course  Procedures (including critical care time) Labs Review Labs Reviewed - No data to display  Imaging Review No results found.   MDM   1. Arm pain, right   2. Contusion    Keep clean with soap and water Bacitracin and bandaid now Watch for infection and seek medical tx prn  Hayden Rasmussenavid Valari Taylor, NP 02/12/15 [phone]

## 2015-03-13 ENCOUNTER — Ambulatory Visit (INDEPENDENT_AMBULATORY_CARE_PROVIDER_SITE_OTHER): Payer: No Typology Code available for payment source | Admitting: Neurology

## 2015-03-13 ENCOUNTER — Encounter: Payer: Self-pay | Admitting: Neurology

## 2015-03-13 VITALS — BP 125/89 | HR 70 | Ht 71.0 in | Wt 210.0 lb

## 2015-03-13 DIAGNOSIS — R258 Other abnormal involuntary movements: Secondary | ICD-10-CM

## 2015-03-13 DIAGNOSIS — R519 Headache, unspecified: Secondary | ICD-10-CM

## 2015-03-13 DIAGNOSIS — R51 Headache: Secondary | ICD-10-CM

## 2015-03-13 DIAGNOSIS — R253 Fasciculation: Secondary | ICD-10-CM

## 2015-03-13 MED ORDER — BUTALBITAL-APAP-CAFFEINE 50-325-40 MG PO TABS: 1.0000 | ORAL_TABLET | Freq: Four times a day (QID) | ORAL | Status: DC | PRN

## 2015-03-13 NOTE — Patient Instructions (Signed)
We will recheck your muscle enzymes to see a trend. We will call you with your test results.   Please remember, common headache triggers are: sleep deprivation, dehydration, overheating, stress, hypoglycemia or skipping meals and blood sugar fluctuations, excessive pain medications or excessive alcohol use or caffeine withdrawal. Some people have food triggers such as aged cheese, orange juice or chocolate, especially dark chocolate, or MSG (monosodium glutamate). Try to avoid these headache triggers as much possible. It may be helpful to keep a headache diary to figure out what makes your headaches worse or brings them on and what alleviates them. Some people report headache onset after exercise but studies have shown that regular exercise may actually prevent headaches from coming. If you have exercise-induced headaches, please make sure that you drink plenty of fluid before and after exercising and that you do not over do it and do not overheat.  We will try fioricet as needed for headache. Do not take daily. Use sparingly.  Follow up in 3-4 months.

## 2015-03-13 NOTE — Progress Notes (Signed)
Subjective:    Patient ID: Douglas Wong is a 34 y.o. male.  HPI     Interim history:   Douglas Wong is a 34 year old right-handed gentleman with an underlying medical history of smoking, anxiety and depression (on Prozac as a teenager), alcoholic fatty liver, and reflux disease, who presents for follow-up consultation of his involuntary muscle twitching. The patient is unaccompanied today. I first met him on 12/12/2014 at the request of his ENT physician, at which time he reported a 2 year history of muscle twitching in various areas of his body, including his thighs, shoulders, around his eyes, but denied any weakness, atrophy, numbness or pain. I suggested further workup in the form of labs: CBC with differential was unremarkable, CMP unremarkable, B12 and folate unremarkable, RPR nonreactive, TSH normal, vitamin D normal, hemoglobin A1c normal, serum copper and ceruloplasmin normal, methylmalonic acid normal, vitamin B6 and B1 normal, CK level normal, CRP normal,  ANA normal. We called him with his test results. I also requested a brain MRI without contrast. He had this on 12/18/2014: Mildly abnormal MRI brain (without) demonstrating: 1. Scattered periventricular and subcortical and juxtacortical foci of T2 hyperintensities. These findings are non-specific and considerations include autoimmune, inflammatory, post-infectious, microvascular ischemic or migraine associated etiologies..   2. No acute findings. We called him with his test results. In addition, personally reviewed the images through the PACS system and I also shared some images on the computer with him.   Today, 03/13/2015: He reports feeling about the same. Perhaps his muscle twitching is a little bit better. He has noted some twitching lately around his left eye. He is bothered more by his recurrent headaches. Sometimes he feels this could be a sinus headache but he was checked for that with his ENT. He also reports some nausea with his  headaches and it seems to be on one side and around the eye at times. He has taken Advil migraine as needed for this. He was prescribed Phenergan about 6 or 7 months ago which he did not think was helpful for nausea. He has occasional nausea but no vomiting with the headache. He has no new symptoms.  Previously:   In addition, he reports several other issues for the past months or years including recurrent headaches in the right frontal area, feeling of dizziness and balance problems, feeling lightheaded and faint, concentration problems, focusing problems, feeling "out of it", depressive symptoms and anxiety issues. He has a history of migraines since he was a child he states. He has taken Excedrin Migraine for this. He has some associated phono and sonophobia but no nausea or vomiting. Headaches are particularly common in the right frontal area.   He was diagnosed and treated for depression when he was a teenager and was on Prozac at the time but did not continue the medication and did not go back to his doctor. He has not yet talked to his primary care physician about his anxiety, depression and focus problems he says. He denies a family history of ALS/Lou Gehrig's disease, myasthenia gravis, neuropathy or tic disorder or Tourette's syndrome. His symptoms come and go. Currently he does not feel any twitching. It can be on a day-to-day basis. Twitching lasts for seconds at a time. It may return in the same area go to another muscle area. He has been less good with his exercise routine. He tries to drink water. He does not drink a lot of sodas or caffeine at this time. He may  drink a soda every other week or so. He quit drinking alcohol altogether in March of last year. He was drinking regularly but was never considered a heavy drinker. He was drinking 1-2 beers per day typically at the time. He was told that he had either alcoholic fatty liver or fatty liver from taking too much Tylenol. He was taking Tylenol  every day because of toothaches at the time. He does not take any illicit drugs and does not have a history of illicit drug use. He lives with his wife and 2 children. He has 2 other children who live with their mothers. He works 2 jobs. He does endorse some stress. He typically goes to bed around 1 AM and has a wake time of 7:30 AM. He estimates he gets about 6 hours of sleep on an average. For reflux symptoms he has been taking Prilosec OTC. He snores but denies any apneic pauses or gasping sensations while asleep. He denies suicidal or homicidal ideations.  I reviewed his electronic chart. In the past year and a half he had numerous emergency room and urgent care visits for various complaints including headaches, back pain, abdominal pain, sore throat, panic attack, numbness and tingling, twitching around his eyes as well as chest pains. I reviewed pertinent lab results which included normal vitamin B12 level on 09/02/2014, normal hemoglobin A1c on 07/31/2014, normal TSH, vitamin D level was low on 07/31/2014 at 17, on 08/15/2014 he had negative hepatitis screen for hepatitis A,  B and C, negative HIV, negative ANA.   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 He was advised that there is typically no good treatment for this problem and it may resolve over time he was advised to avoid caffeine and nicotine.  His Past Medical History Is Significant For: Past Medical History  Diagnosis Date  . Alcoholic fatty liver   . Hypertension     His Past Surgical History Is Significant For: Past Surgical History  Procedure Laterality Date  . No past surgeries      His Family History Is Significant For: Family History  Problem Relation Age of Onset  . Diabetes Mother   . Hypertension Mother   . Cancer Father     liver   . Hypertension Father   . Cancer Paternal Uncle     coon cancer     His Social History Is Significant For: History   Social History  . Marital Status: Married    Spouse Name: Virgina Norfolk  . Number of Children: 4  . Years of Education: GED   Occupational History  .      [employer]   Social History Main Topics  . Smoking status: Current Some Day Smoker -- 0.25 packs/day for 14 years    Types: Cigarettes    Last Attempt to Quit: 10/24/2013  . Smokeless tobacco: Never Used  . Alcohol Use: No  . Drug Use: No  . Sexual Activity: Not on file   Other Topics Concern  . None   Social History Narrative   Patient lives at home with his wife Sharalyn Ink)   Patient works full time.   Education GED.   Right handed.   Caffeine sometimes not daily.    His Allergies Are:  No Known Allergies:   His Current Medications  Are:  Outpatient Encounter Prescriptions as of 03/13/2015  Medication Sig  . esomeprazole (NEXIUM) 20 MG capsule Take 20 mg by mouth daily at 12 noon.  . Multiple Vitamin (MULTIVITAMIN WITH MINERALS) TABS tablet Take 1 tablet by mouth every morning.  . propranolol (INDERAL) 20 MG tablet Take 20 mg by mouth daily.  . Vitamin D, Ergocalciferol, (DRISDOL) 50000 UNITS CAPS capsule Take 1 capsule (50,000 Units total) by mouth every 7 (seven) days.  . [DISCONTINUED] OVER THE COUNTER MEDICATION Place 1 drop into the right ear daily as needed (pain.).  Marland Kitchen butalbital-acetaminophen-caffeine (FIORICET, ESGIC) 50-325-40 MG per tablet Take 1 tablet by mouth every 6 (six) hours as needed for headache.  . [DISCONTINUED] methocarbamol (ROBAXIN) 500 MG tablet Take 1 tablet (500 mg total) by mouth 2 (two) times daily as needed for muscle spasms.  . [DISCONTINUED] omeprazole (PRILOSEC) 20 MG capsule Take 1 capsule (20 mg total) by mouth daily.  . [DISCONTINUED] promethazine (PHENERGAN) 25 MG tablet Take 1 tablet (25 mg total) by mouth every 6 (six) hours as needed for nausea or vomiting.  .  [DISCONTINUED] traMADol (ULTRAM) 50 MG tablet Take 1 tablet (50 mg total) by mouth every 6 (six) hours as needed.  :  Review of Systems:  Out of a complete 14 point review of systems, all are reviewed and negative with the exception of these symptoms as listed below:   Review of Systems  Neurological: Positive for headaches.       Muscle Twitching  Balance Problem Lack of concentration Anxiety and depression    Objective:  Neurologic Exam  Physical Exam Physical Examination:   Filed Vitals:   03/13/15 1059  BP: 125/89  Pulse: 70   General Examination: The patient is a 34 y.o. male in no acute distress. He appears well-developed and well-nourished and well groomed.   HEENT: Normocephalic, atraumatic, pupils are equal, round and reactive to light and accommodation. Funduscopic exam is normal with sharp disc margins noted. I did not see any twitching around his eyes. He has no abnormal involuntary movements in his face. Extraocular tracking is good without limitation to gaze excursion or nystagmus noted. Normal smooth pursuit is noted. Hearing is grossly intact. Face is symmetric with normal facial animation and normal facial sensation. Speech is clear with no dysarthria noted. There is no hypophonia. There is no lip, neck/head, jaw or voice tremor. Neck is supple with full range of passive and active motion. There are no carotid bruits on auscultation. Oropharynx exam reveals: mild mouth dryness, good dental hygiene and mild airway crowding. Mallampati is class I.   Chest: Clear to auscultation without wheezing, rhonchi or crackles noted.  Heart: S1+S2+0, regular and normal without murmurs, rubs or gallops noted.   Abdomen: Soft, non-tender and non-distended with normal bowel sounds appreciated on auscultation.  Extremities: There is no pitting edema in the distal lower extremities bilaterally. Pedal pulses are intact.  Skin: Warm and dry without trophic changes noted. There are no  varicose veins.  Musculoskeletal: exam reveals no obvious joint deformities, tenderness or joint swelling or erythema.   Neurologically:  Mental status: The patient is awake, alert and oriented in all 4 spheres. His immediate and remote memory, attention, language skills and fund of knowledge are appropriate. There is no evidence of aphasia, agnosia, apraxia or anomia. Speech is clear with normal prosody and enunciation. Thought process is linear. Mood is decreased range and affect is normal.   Cranial nerves II - XII are as described above  under HEENT exam. In addition: shoulder shrug is normal with equal shoulder height noted. Motor exam: Normal bulk, strength and tone is noted. In particular, there is no atrophy, no fasciculations are noted and no myoclonus or athetoid movements, no dystonic posturing. He has no pain on palpation of his arms and legs.  There is no drift, tremor or rebound. Romberg is negative. Reflexes are 2+ throughout. Babinski: Toes are flexor bilaterally. Fine motor skills and coordination: intact with normal finger taps, normal hand movements, normal rapid alternating patting, normal foot taps and normal foot agility.  Cerebellar testing: No dysmetria or intention tremor on finger to nose testing. Heel to shin is unremarkable bilaterally. There is no truncal or gait ataxia.  Sensory exam: intact to light touch, pinprick, vibration, temperature sense in the upper and lower extremities.  Gait, station and balance: He stands easily. No veering to one side is noted. No leaning to one side is noted. Posture is age-appropriate and stance is narrow based. Gait shows normal stride length and normal pace. No problems turning are noted. He turns en bloc. Tandem walk is unremarkable. Intact toe and heel stance is noted.               Assessment and Plan:   In summary, Douglas Wong is a very pleasant 34 year old male with an underlying medical history of smoking, anxiety and depression  (on Prozac as a teenager), alcoholic fatty liver, and reflux disease, who reports a 2 year history of muscle twitching in various areas of his body, including thighs, shoulder areas and around the eyes, especially around the L eye in the few months. In addition, he presents with recurrent headaches. He may have a combination of tension-type, sinus and migrainous headaches. He is advised not to take even over-the-counter medication on a day-to-day basis to avoid medication overuse or rebound headaches. He has limited his caffeine intake. We did blood work last time which showed a mildly elevated CK level. I would like to repeat this. I would like to add an aldolase level as well. We will see what the trend shows. We will also consider an EMG and nerve conduction test next. On examination he has a benign exam. I did not appreciate any atrophy, fasciculations, or myoclonus. I cannot tie in all of his symptoms together at this time. We will call him with his test results. I advised him that there is no specific medication that I would recommend at this time for his twitching. However, for his headaches have suggested a trial of Fioricet, but advised him that he should not take this on a daily basis and it can be addictive. It can also cause rebound headaches. We also talked about headache triggers. In addition, I asked him to stay well-hydrated and make sure he does get enough rest and enough sleep time. Sometimes sleep deprivation can cause muscle twitching and electrolyte disturbance can also cause intermittent vague fasciculations that are not of any other sinister consequence. At this juncture, I would see him back routinely for checkup in a few months and we will keep them posted over the phone as far as his test results go. I answered all his questions today and he was in agreement.  I spent 15 minutes in total face-to-face time with the patient, more than 50% of which was spent in counseling and coordination of  care, reviewing test results, reviewing medication and discussing or reviewing the diagnosis of muscle twitching and headaches, the prognosis and  treatment options.

## 2015-03-14 ENCOUNTER — Telehealth: Payer: Self-pay

## 2015-03-14 NOTE — Telephone Encounter (Signed)
I spoke to Douglas Wong and he is aware of results below.

## 2015-03-14 NOTE — Progress Notes (Signed)
Quick Note:  Pls call Douglas Wong: CK level still mildly elevated, but as discussed yesterday, we want to see a trend. It is down from 3 mo ago and we will keep an eye on it. No further action required. The other test, Aldolase, is not back yet, but just wanted to let Douglas Wong know that CK level looks better. We will probably repeat next time he comes to see us. I just wanted to clarify because it is not listed in his medicines, is he taking cholesterol medication such as Lipitor or Zocor or crestor? Sometimes cholesterol medications cause a mild increase in CK levels. Huston FoleySaima Dotsie Gillette, MD, PhD [facility] ([facility]  ______

## 2015-03-16 LAB — ALDOLASE: Aldolase: 8.1 U/L (ref 3.3–10.3)

## 2015-03-16 LAB — CK: CK TOTAL: 312 U/L — AB (ref 24–204)

## 2015-05-18 IMAGING — CR DG CHEST 2V
2 series · 2 of 2 positions shown · non-contrast
Comparison: None.

CLINICAL DATA: Chest pain and pressure, history of tobacco use

EXAM:
CHEST  2 VIEW

[view not recorded (1 of 2)]
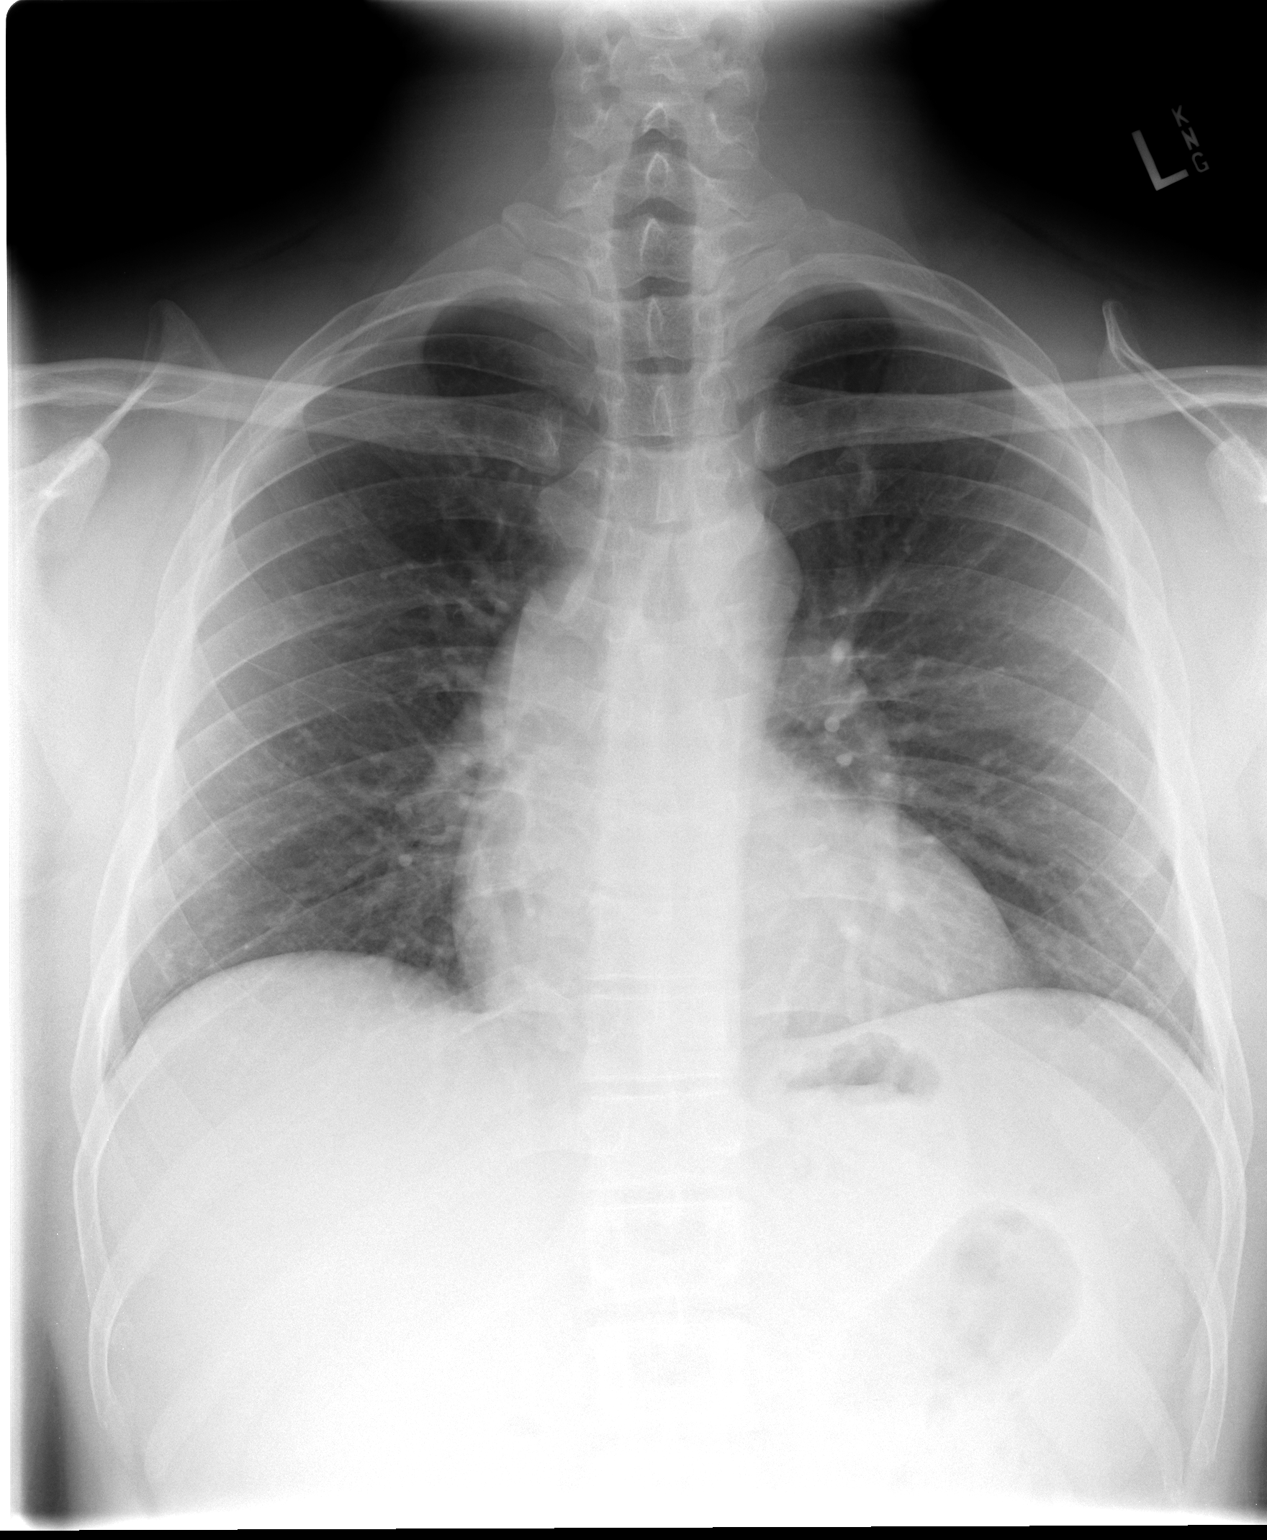

[view not recorded (2 of 2)]
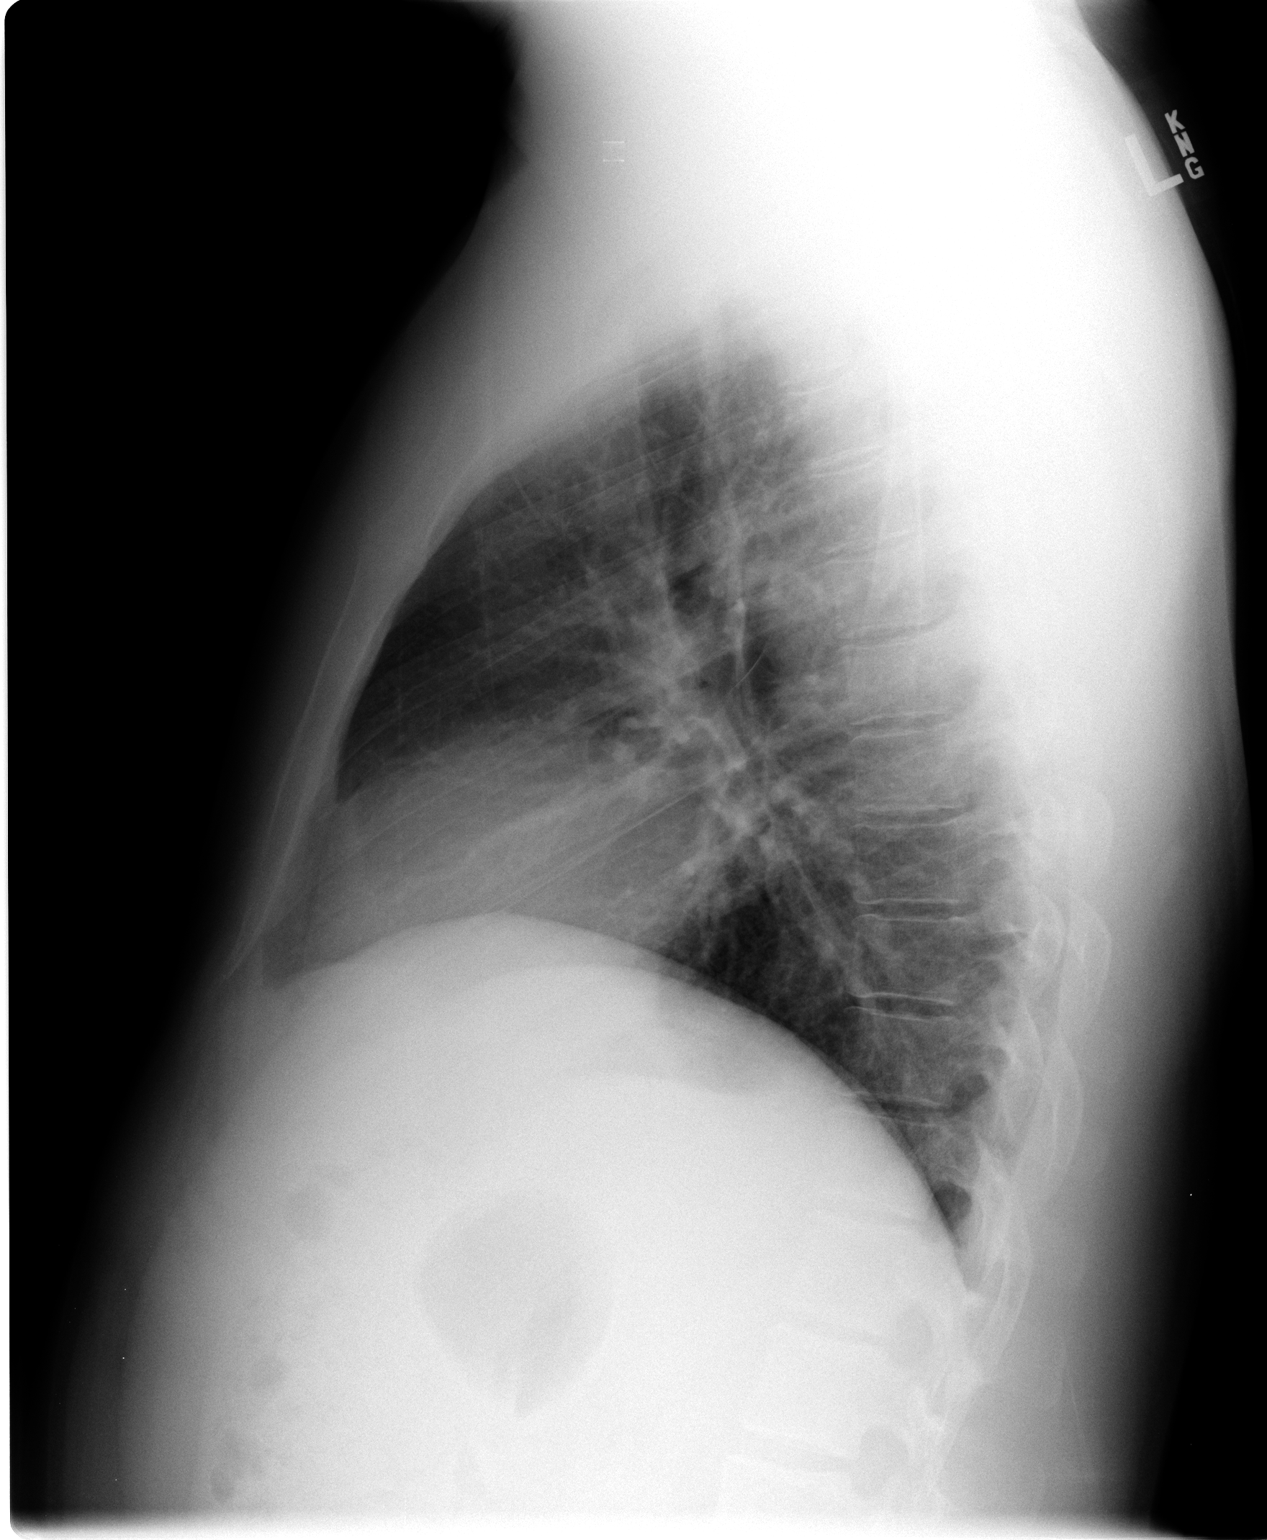

[2 of 2 positions shown; findings below may reference images not displayed]

FINDINGS: The lungs are adequately inflated and clear. The cardiopericardial
silhouette is normal in size. The mediastinum is normal in width.
There is no pleural effusion or pneumothorax or pneumomediastinum.
The observed portions of the bony thorax are normal.
IMPRESSION: There is no evidence of active cardiopulmonary disease.

## 2015-06-02 IMAGING — US US ABDOMEN LIMITED
1 series · 14 of 25 positions shown · non-contrast
Comparison: none

[Series 1: us abdomen limited · 0.27mm/px · 14 of 46 slices shown]
[im 1/46]
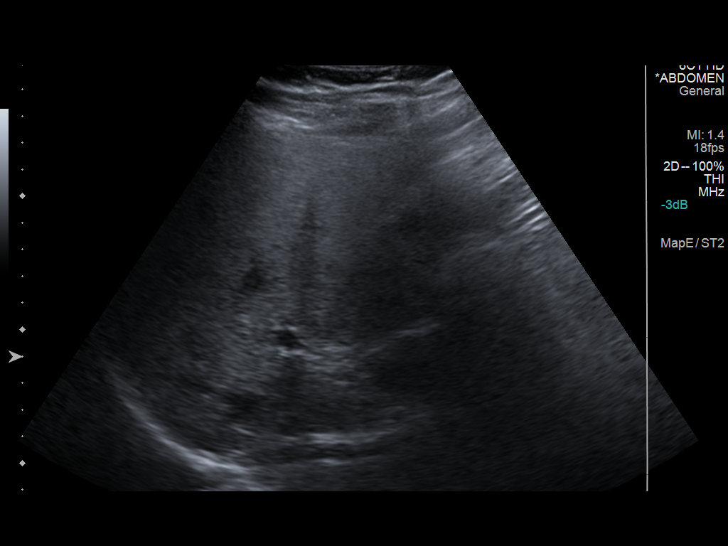
[im 4/46]
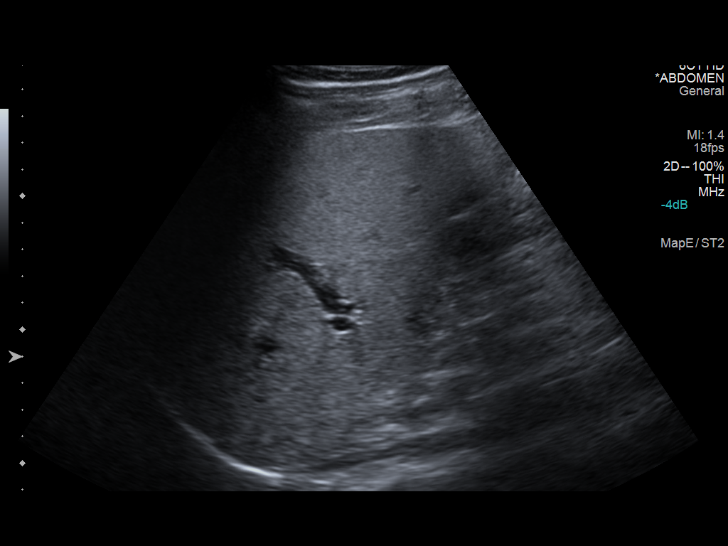
[im 8/46]
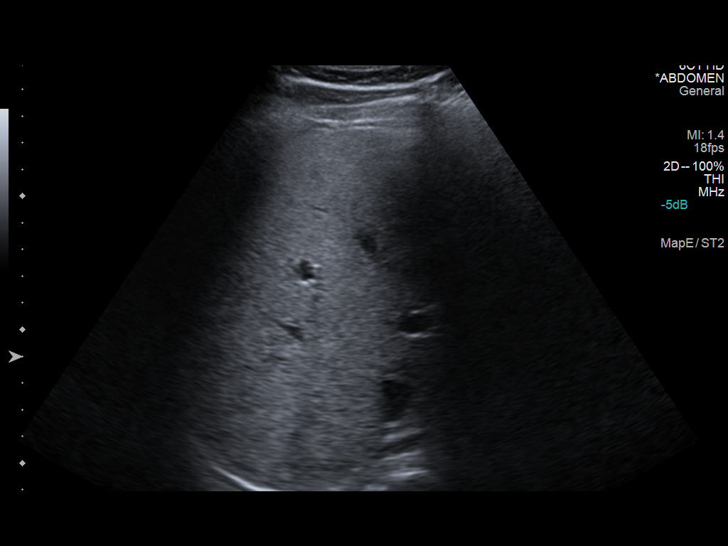
[im 12/46]
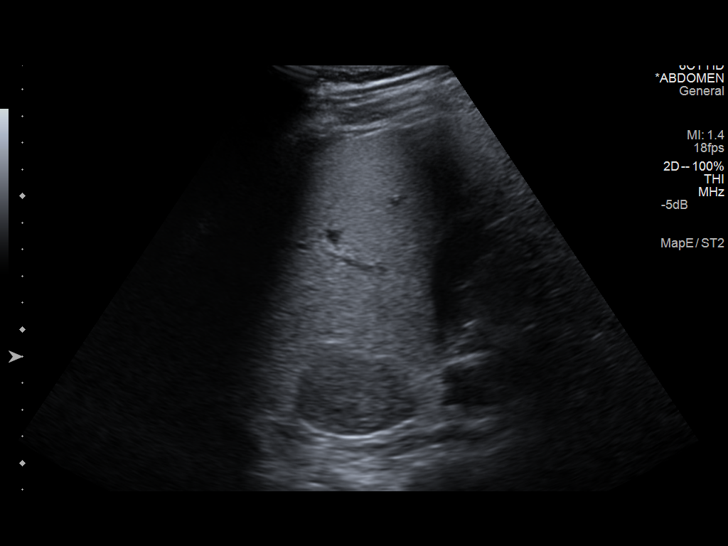
[im 16/46]
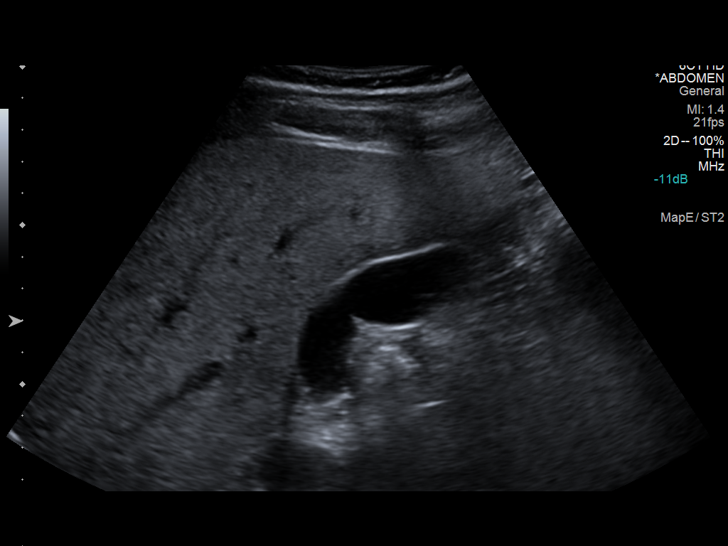
[im 17/46]
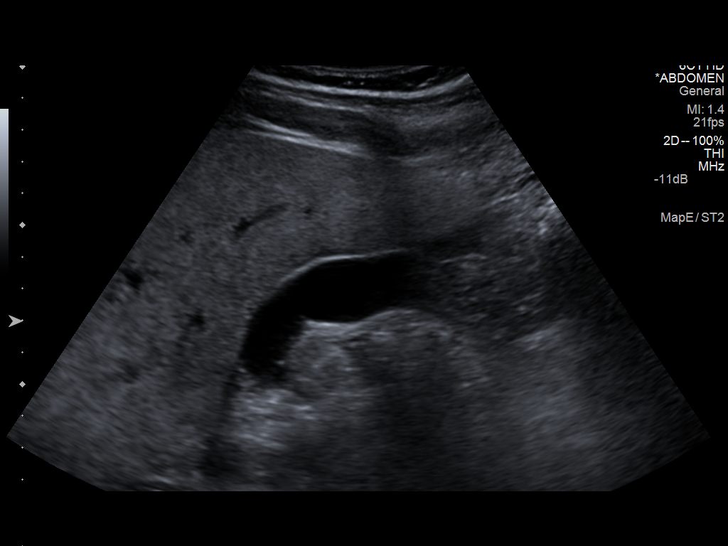
[im 21/46]
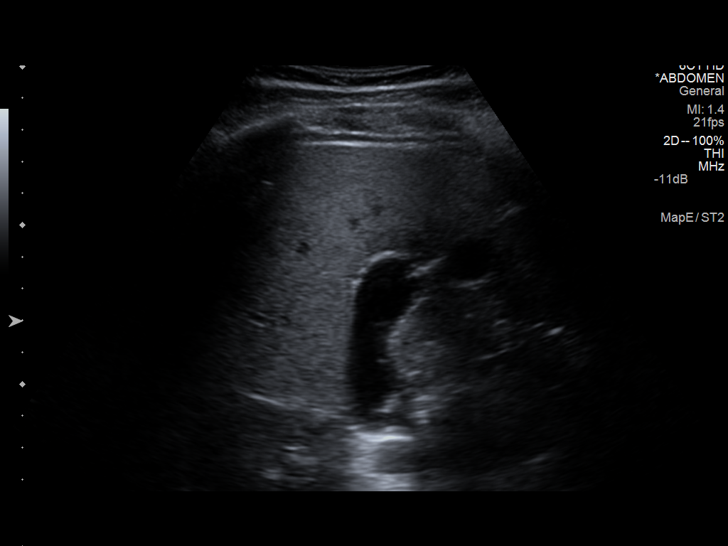
[im 25/46]
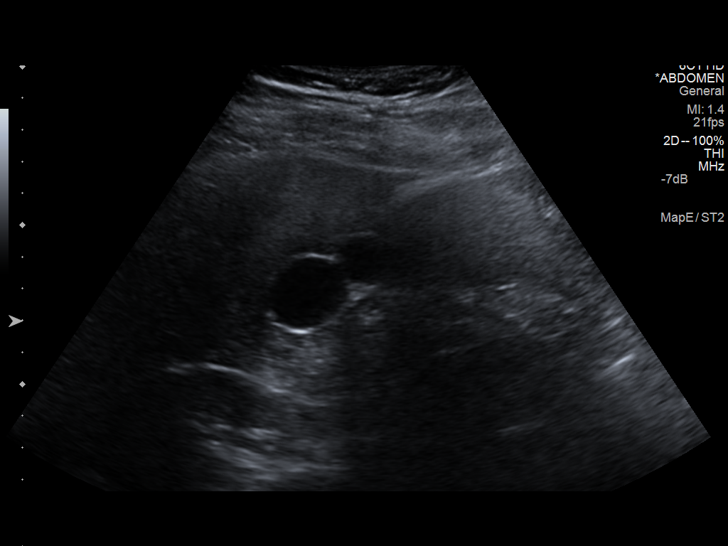
[im 29/46]
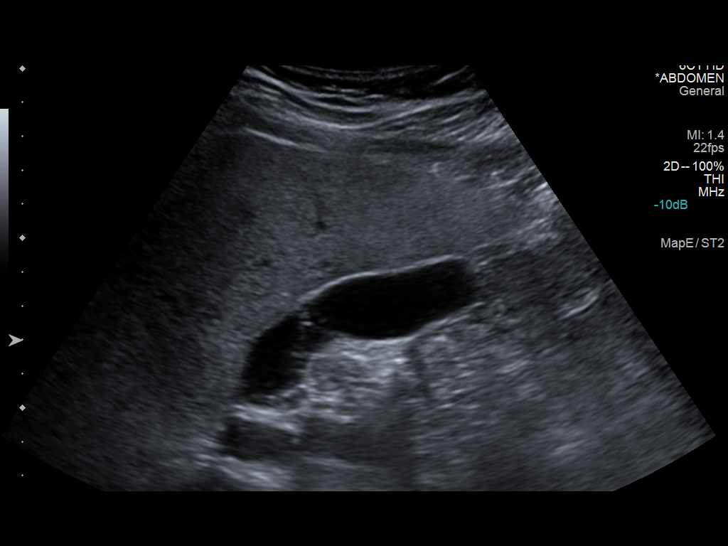
[im 31/46]
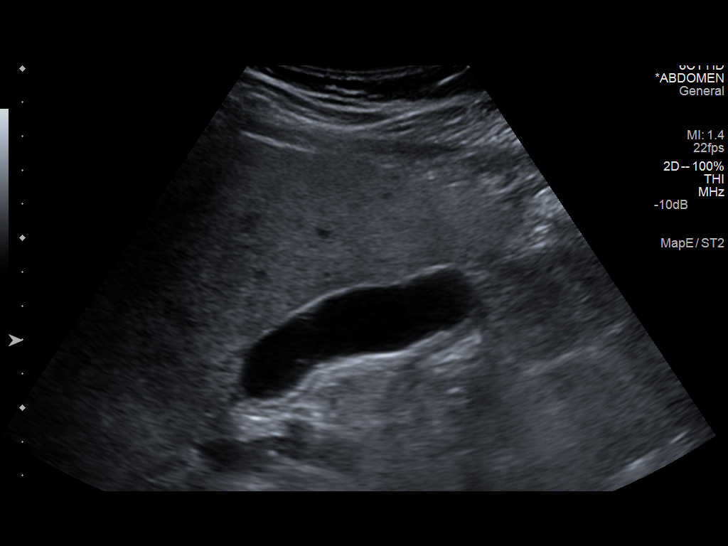
[im 34/46]
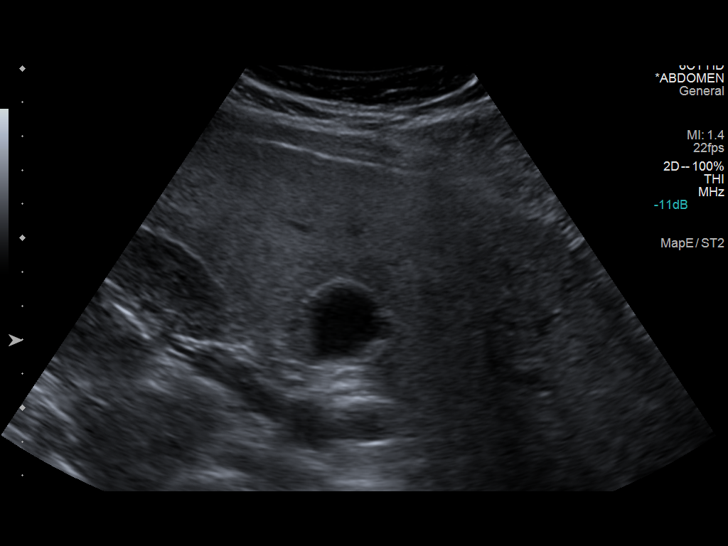
[im 38/46]
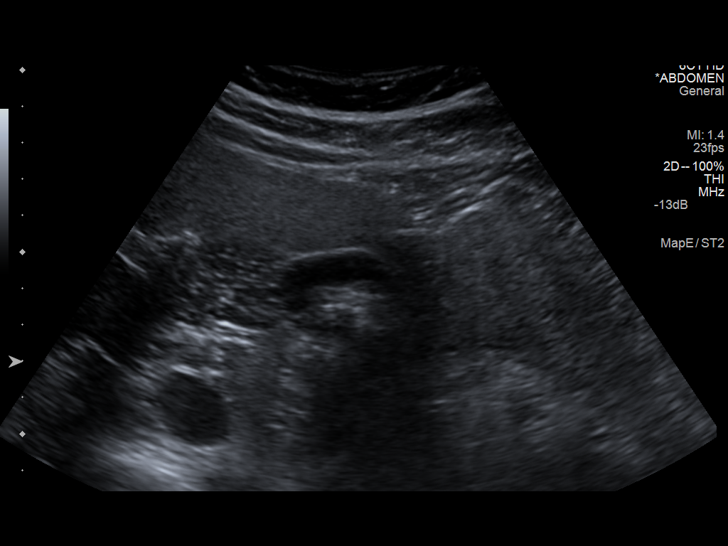
[im 42/46]
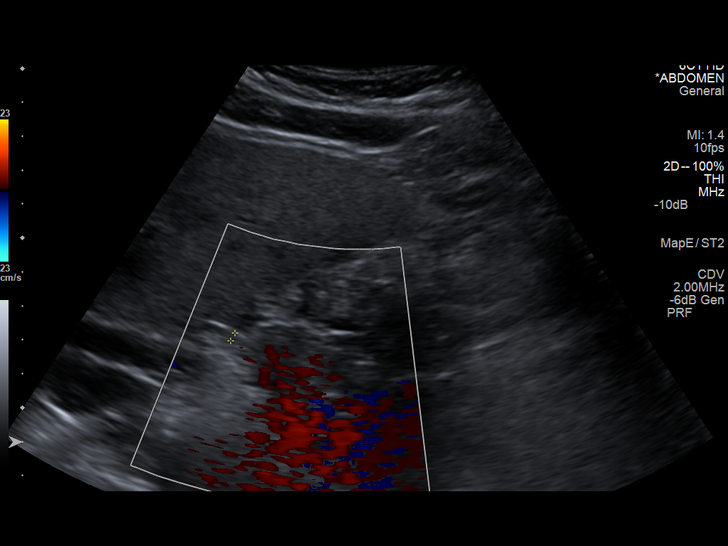
[im 46/46]
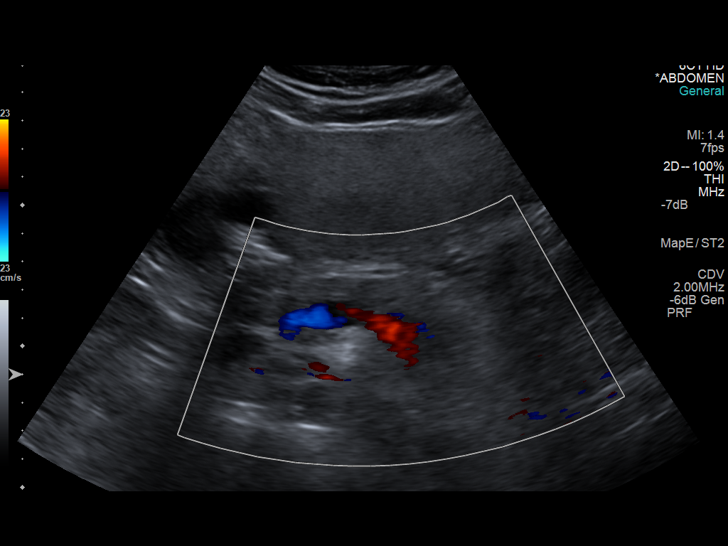

[14 of 25 positions shown; findings below may reference images not displayed]

CLINICAL DATA
Right upper quadrant and epigastric pain.

EXAM
US ABDOMEN LIMITED - RIGHT UPPER QUADRANT

COMPARISON
None.

FINDINGS
Gallbladder:

No gallstones or wall thickening visualized. No sonographic Murphy
sign noted.

Common bile duct:

Diameter: 2 mm, within normal limits.

Liver:

No focal lesion identified. Hepatic echogenicity mildly increased.
Focal lesion detection is limited in this setting.

IMPRESSION
No gallstones or sonographic evidence for cholecystitis.

Mildly increased hepatic echogenicity can be seen in the setting of
steatosis or hepatitis.

SIGNATURE

## 2015-07-15 ENCOUNTER — Encounter: Payer: Self-pay | Admitting: Neurology

## 2015-07-15 ENCOUNTER — Ambulatory Visit (INDEPENDENT_AMBULATORY_CARE_PROVIDER_SITE_OTHER): Payer: No Typology Code available for payment source | Admitting: Neurology

## 2015-07-15 VITALS — BP 128/82 | HR 72 | Resp 16 | Ht 71.0 in | Wt 220.0 lb

## 2015-07-15 DIAGNOSIS — R351 Nocturia: Secondary | ICD-10-CM | POA: Diagnosis not present

## 2015-07-15 DIAGNOSIS — R0683 Snoring: Secondary | ICD-10-CM

## 2015-07-15 DIAGNOSIS — R51 Headache: Secondary | ICD-10-CM

## 2015-07-15 DIAGNOSIS — R748 Abnormal levels of other serum enzymes: Secondary | ICD-10-CM

## 2015-07-15 DIAGNOSIS — R258 Other abnormal involuntary movements: Secondary | ICD-10-CM

## 2015-07-15 DIAGNOSIS — R519 Headache, unspecified: Secondary | ICD-10-CM

## 2015-07-15 DIAGNOSIS — R253 Fasciculation: Secondary | ICD-10-CM

## 2015-07-15 NOTE — Progress Notes (Signed)
Subjective:    Patient ID: Douglas Wong is a 34 y.o. male.  HPI     Interim history:   [name] is a 34 year old right-handed gentleman with an underlying medical history of smoking, anxiety and depression (on Prozac as a teenager), alcoholic fatty liver, and reflux disease, who presents for follow-up consultation of his involuntary muscle twitching. The patient is unaccompanied today. I last saw him on 03/13/2015, at which time he reported feeling about the same. He felt that his muscle twitching was a little better. He had noted some muscle twitching around his left eye. He was reporting recurrent headaches. He had seen ENT. He was taking Advil migraine as needed for this. He had tried Phenergan about 6 or 7 months prior which he did not think was helpful for nausea. He had occasional nausea but no vomiting with the headache. His exam was benign. I suggested Fioricet as needed for headaches but counseled him on headache triggers extensively. We checked a CK level on 03/13/2015, which was improved from last time down to 312 from previously 410, and we called him with his test results. His aldolase was normal at 8.1.   Today, 07/15/2015: He reports ongoing issues with HAs, but also nasal congestion, which started this year. He has muscle twitching in different places. He works out regularly. He does not drink caffeine every day. He does endorse snoring and morning headaches. His headache is typically a dull sensation in the frontal areas. He has occasional nausea but no vomiting. He has had some blurry vision but has had his eyes checked out. He was told he has mild astigmatism. He sometimes wakes up with a headache. He does not always wake up rested. He does admit that he does not always allow for enough sleep time averaging about 5 hours a night.  Previously:   I first met him on 12/12/2014 at the request of his ENT physician, at which time he reported a 2 year history of muscle twitching in  various areas of his body, including his thighs, shoulders, around his eyes, but denied any weakness, atrophy, numbness or pain. I suggested further workup in the form of labs: CBC with differential was unremarkable, CMP unremarkable, B12 and folate unremarkable, RPR nonreactive, TSH normal, vitamin D normal, hemoglobin A1c normal, serum copper and ceruloplasmin normal, methylmalonic acid normal, vitamin B6 and B1 normal, CK level normal, CRP normal,  ANA normal. We called him with his test results. I also requested a brain MRI without contrast. He had this on 12/18/2014: Mildly abnormal MRI brain (without) demonstrating: 1. Scattered periventricular and subcortical and juxtacortical foci of T2 hyperintensities. These findings are non-specific and considerations include autoimmune, inflammatory, post-infectious, microvascular ischemic or migraine associated etiologies..   2. No acute findings. We called him with his test results. In addition, personally reviewed the images through the PACS system and I also shared some images on the computer with him.   In addition, he reports several other issues for the past months or years including recurrent headaches in the right frontal area, feeling of dizziness and balance problems, feeling lightheaded and faint, concentration problems, focusing problems, feeling "out of it", depressive symptoms and anxiety issues. He has a history of migraines since he was a child he states. He has taken Excedrin Migraine for this. He has some associated phono and sonophobia but no nausea or vomiting. Headaches are particularly common in the right frontal area.   He was diagnosed and treated for depression when  he was a teenager and was on Prozac at the time but did not continue the medication and did not go back to his doctor. He has not yet talked to his primary care physician about his anxiety, depression and focus problems he says. He denies a family history of ALS/Lou Gehrig's  disease, myasthenia gravis, neuropathy or tic disorder or Tourette's syndrome. His symptoms come and go. Currently he does not feel any twitching. It can be on a day-to-day basis. Twitching lasts for seconds at a time. It may return in the same area go to another muscle area. He has been less good with his exercise routine. He tries to drink water. He does not drink a lot of sodas or caffeine at this time. He may drink a soda every other week or so. He quit drinking alcohol altogether in March of last year. He was drinking regularly but was never considered a heavy drinker. He was drinking 1-2 beers per day typically at the time. He was told that he had either alcoholic fatty liver or fatty liver from taking too much Tylenol. He was taking Tylenol every day because of toothaches at the time. He does not take any illicit drugs and does not have a history of illicit drug use. He lives with his wife and 2 children. He has 2 other children who live with their mothers. He works 2 jobs. He does endorse some stress. He typically goes to bed around 1 AM and has a wake time of 7:30 AM. He estimates he gets about 6 hours of sleep on an average. For reflux symptoms he has been taking Prilosec OTC. He snores but denies any apneic pauses or gasping sensations while asleep. He denies suicidal or homicidal ideations.  I reviewed his electronic chart. In the past year and a half he had numerous emergency room and urgent care visits for various complaints including headaches, back pain, abdominal pain, sore throat, panic attack, numbness and tingling, twitching around his eyes as well as chest pains. I reviewed pertinent lab results which included normal vitamin B12 level on 09/02/2014, normal hemoglobin A1c on 07/31/2014, normal TSH, vitamin D level was low on 07/31/2014 at 17, on 08/15/2014 he had negative hepatitis screen for hepatitis A,  B and C, negative HIV, negative ANA.   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 He was advised that there is typically no good treatment for this problem and it may resolve over time he was advised to avoid caffeine and nicotine.   His Past Medical History Is Significant For: Past Medical History  Diagnosis Date  . Alcoholic fatty liver   . Hypertension     His Past Surgical History Is Significant For: Past Surgical History  Procedure Laterality Date  . No past surgeries      His Family History Is Significant For: Family History  Problem Relation Age of Onset  . Diabetes Mother   . Hypertension Mother   . Cancer Father     liver  . Hypertension Father   . Cancer Paternal Uncle     coon cancer     His Social History Is Significant For: Social History   Social History  . Marital Status: Married    Spouse Name: Virgina Norfolk  . Number of Children: 4  .  Years of Education: GED   Occupational History  .      [employer]   Social History Main Topics  . Smoking status: Current Every Day Smoker -- 0.25 packs/day for 14 years    Types: Cigarettes  . Smokeless tobacco: Never Used  . Alcohol Use: No  . Drug Use: No  . Sexual Activity: Not Asked   Other Topics Concern  . None   Social History Narrative   Patient lives at home with his wife Sharalyn Ink)   Patient works full time.   Education GED.   Right handed.   Caffeine sometimes not daily.    His Allergies Are:  No Known Allergies:   His Current Medications Are:  Outpatient Encounter Prescriptions as of 07/15/2015  Medication Sig  . cholecalciferol (VITAMIN D) 1000 UNITS tablet Take 1,000 Units by mouth daily.  . Multiple Vitamin (MULTIVITAMIN WITH MINERALS) TABS tablet Take 1 tablet by mouth every morning.  . [DISCONTINUED] butalbital-acetaminophen-caffeine (FIORICET, ESGIC) 50-325-40 MG per tablet Take 1 tablet by  mouth every 6 (six) hours as needed for headache.  . [DISCONTINUED] esomeprazole (NEXIUM) 20 MG capsule Take 20 mg by mouth daily at 12 noon.  . [DISCONTINUED] propranolol (INDERAL) 20 MG tablet Take 20 mg by mouth daily.  . [DISCONTINUED] Vitamin D, Ergocalciferol, (DRISDOL) 50000 UNITS CAPS capsule Take 1 capsule (50,000 Units total) by mouth every 7 (seven) days.   No facility-administered encounter medications on file as of 07/15/2015.  :  Review of Systems:  Out of a complete 14 point review of systems, all are reviewed and negative with the exception of these symptoms as listed below:   Review of Systems  HENT: Positive for sinus pressure.   Neurological:       Patient reports pressure in the forehead area, states that it is affecting his vision. Pressure is constant. Continued muscle twitching especially in R side of face.     Objective:  Neurologic Exam  Physical Exam Physical Examination:   Filed Vitals:   07/15/15 1307  BP: 128/82  Pulse: 72  Resp: 16   General Examination: The patient is a 34 y.o. male in no acute distress. He appears well-developed and well-nourished and well groomed.   HEENT: Normocephalic, atraumatic, pupils are equal, round and reactive to light and accommodation. Funduscopic exam is normal with sharp disc margins noted. I did not see any twitching around his eyes or face. He has no abnormal involuntary movements in his face. Extraocular tracking is good without limitation to gaze excursion or nystagmus noted. Normal smooth pursuit is noted. Hearing is grossly intact. Face is symmetric with normal facial animation and normal facial sensation. Speech is clear with no dysarthria noted. There is no hypophonia. There is no lip, neck/head, jaw or voice tremor. Neck is supple with full range of passive and active motion. There are no carotid bruits on auscultation. Oropharynx exam reveals: mild mouth dryness, good dental hygiene and mild airway crowding,  secondary to narrow airway entry, tonsils are 1+ bilaterally. Uvula is thin and elongated. Mallampati is class I.   Chest: Clear to auscultation without wheezing, rhonchi or crackles noted.  Heart: S1+S2+0, regular and normal without murmurs, rubs or gallops noted.   Abdomen: Soft, non-tender and non-distended with normal bowel sounds appreciated on auscultation.  Extremities: There is no pitting edema in the distal lower extremities bilaterally. Pedal pulses are intact.  Skin: Warm and dry without trophic changes noted. There are no varicose veins.  Musculoskeletal: exam reveals  no obvious joint deformities, tenderness or joint swelling or erythema.   Neurologically:  Mental status: The patient is awake, alert and oriented in all 4 spheres. His immediate and remote memory, attention, language skills and fund of knowledge are appropriate. There is no evidence of aphasia, agnosia, apraxia or anomia. Speech is clear with normal prosody and enunciation. Thought process is linear. Mood is normal and affect is normal.   Cranial nerves II - XII are as described above under HEENT exam. In addition: shoulder shrug is normal with equal shoulder height noted. Motor exam: Normal bulk, strength and tone is noted. In particular, there is no atrophy, no fasciculations are noted and no myoclonus or athetoid movements, no dystonic posturing. He has no pain on palpation of his arms and legs.  There is no drift, tremor or rebound. Romberg is negative. Reflexes are 2+ throughout. Babinski: Toes are flexor bilaterally. Fine motor skills and coordination: intact with normal finger taps, normal hand movements, normal rapid alternating patting, normal foot taps and normal foot agility.  Cerebellar testing: No dysmetria or intention tremor on finger to nose testing. Heel to shin is unremarkable bilaterally. There is no truncal or gait ataxia.  Sensory exam: intact to light touch, pinprick, vibration, temperature sense  in the upper and lower extremities.  Gait, station and balance: He stands easily. No veering to one side is noted. No leaning to one side is noted. Posture is age-appropriate and stance is narrow based. Gait shows normal stride length and normal pace. No problems turning are noted. He turns en bloc. Tandem walk is unremarkable.   Assessment and Plan:   In summary, LADISLAO COHENOUR is a very pleasant 34 year old male with an underlying medical history of smoking, anxiety and depression (on Prozac as a teenager), (?alcoholic) fatty liver, and reflux disease, who reports a 2+ year history of muscle twitching in various areas of his body, including thighs, shoulder areas and around the eyes, and face, intermittent not necessarily progressive. In the recent past he has had recurrent headaches which are primarily frontal in location and described as dull and achy. He has some morning headaches. He does not always wake up rested and endorses snoring.  He may have a combination of tension-type, sinus and migrainous headaches. He is advised not to take even over-the-counter medication on a day-to-day basis to avoid medication overuse or rebound headaches. He has limited his caffeine intake and increased his water intake. His physical and neurological exam continued to be benign. CK level was improved last time we checked. We will recheck this today to look for trends. In addition, we will check his CMP. His vitamin D level has improved from a year ago to almost normal recently. He continues to take over-the-counter vitamin D. We had considered an EMG and nerve conduction test in the past and I would like to go ahead and schedule this for him. In addition, I suggested we proceed with a sleep study for risk of sleep apnea. On examination he has no obvious atrophy, fasciculations or myoclonus. I cannot tie in all of his symptoms together at this time. We will call him with his test results. I advised him that there is no  specific medication that I would recommend at this time for his twitching. He tried Fioricet for his headaches but he did not feel it was helpful. I asked him to stay well-hydrated and make sure he does get enough rest and enough sleep time. I will see him  back in 3-4 months, after these tests are completed. We will call him with his test results in the interim. I answered all his questions today and he was in agreement.  I spent 20 minutes in total face-to-face time with the patient, more than 50% of which was spent in counseling and coordination of care, reviewing test results, reviewing medication and discussing or reviewing the diagnosis of muscle twitching and headaches and OSA, the prognosis and treatment options.

## 2015-07-15 NOTE — Patient Instructions (Addendum)
We will recheck your CK level and liver function, kidney function and electrolytes today.  We will schedule a sleep study.  We will do a muscle and nerve electrical test, called EMG and nerve conduction test and call you with your results. Exam still good. Follow up in about 3-4 months.

## 2015-07-16 ENCOUNTER — Telehealth: Payer: Self-pay

## 2015-07-16 LAB — COMPREHENSIVE METABOLIC PANEL
ALBUMIN: 4.5 g/dL (ref 3.5–5.5)
ALT: 79 IU/L — ABNORMAL HIGH (ref 0–44)
AST: 33 IU/L (ref 0–40)
Albumin/Globulin Ratio: 1.6 (ref 1.1–2.5)
Alkaline Phosphatase: 81 IU/L (ref 39–117)
BUN / CREAT RATIO: 14 (ref 8–19)
BUN: 13 mg/dL (ref 6–20)
Bilirubin Total: 0.3 mg/dL (ref 0.0–1.2)
CALCIUM: 10 mg/dL (ref 8.7–10.2)
CO2: 23 mmol/L (ref 18–29)
CREATININE: 0.92 mg/dL (ref 0.76–1.27)
Chloride: 101 mmol/L (ref 97–108)
GFR calc Af Amer: 128 mL/min/{1.73_m2} (ref >59)
GFR, EST NON AFRICAN AMERICAN: 110 mL/min/{1.73_m2} (ref >59)
GLOBULIN, TOTAL: 2.8 g/dL (ref 1.5–4.5)
Glucose: 81 mg/dL (ref 65–99)
Potassium: 4.5 mmol/L (ref 3.5–5.2)
SODIUM: 141 mmol/L (ref 134–144)
TOTAL PROTEIN: 7.3 g/dL (ref 6.0–8.5)

## 2015-07-16 LAB — CK: Total CK: 329 U/L — ABNORMAL HIGH (ref 24–204)

## 2015-07-16 NOTE — Telephone Encounter (Signed)
-----   Message from Huston Foley, MD sent at 07/16/2015  8:01 AM EDT ----- Please call Douglas Wong: CK again mildly elevated, but overall fairly stable, liver function good, again ALT mildly up but stable compared to before at 79. Let's wait out the EMG and NCV test and take it from there. May ask for a second opinion from Dr. Lucia Gaskins, neuromuscular specialist.  Huston Foley, MD, PhD [facility])

## 2015-07-16 NOTE — Telephone Encounter (Signed)
Left message to call back for lab results.

## 2015-07-16 NOTE — Progress Notes (Signed)
Quick Note:  Please call [place]: CK again mildly elevated, but overall fairly stable, liver function good, again ALT mildly up but stable compared to before at 79. Let's wait out the EMG and NCV test and take it from there. May ask for a second opinion from Dr. Lucia Gaskins, neuromuscular specialist.  Huston Foley, MD, PhD [facility] ([facility])  ______

## 2015-07-17 NOTE — Telephone Encounter (Signed)
Patient called back and I gave him results below. He reports understanding of results and plan of care.

## 2015-08-07 ENCOUNTER — Ambulatory Visit: Payer: No Typology Code available for payment source | Admitting: Neurology

## 2015-08-10 NOTE — Progress Notes (Signed)
No show

## 2015-08-26 ENCOUNTER — Ambulatory Visit (INDEPENDENT_AMBULATORY_CARE_PROVIDER_SITE_OTHER): Payer: No Typology Code available for payment source | Admitting: Neurology

## 2015-08-26 DIAGNOSIS — G478 Other sleep disorders: Secondary | ICD-10-CM | POA: Diagnosis not present

## 2015-08-26 DIAGNOSIS — G4720 Circadian rhythm sleep disorder, unspecified type: Secondary | ICD-10-CM

## 2015-08-26 DIAGNOSIS — R0683 Snoring: Secondary | ICD-10-CM

## 2015-08-26 NOTE — Sleep Study (Signed)
Please see the scanned sleep study interpretation located in the procedure tab within the chart review section.   

## 2015-08-28 ENCOUNTER — Ambulatory Visit (INDEPENDENT_AMBULATORY_CARE_PROVIDER_SITE_OTHER): Payer: No Typology Code available for payment source | Admitting: Neurology

## 2015-08-28 ENCOUNTER — Ambulatory Visit (INDEPENDENT_AMBULATORY_CARE_PROVIDER_SITE_OTHER): Payer: Self-pay | Admitting: Neurology

## 2015-08-28 DIAGNOSIS — R258 Other abnormal involuntary movements: Secondary | ICD-10-CM

## 2015-08-28 DIAGNOSIS — Z0289 Encounter for other administrative examinations: Secondary | ICD-10-CM

## 2015-08-28 DIAGNOSIS — R253 Fasciculation: Secondary | ICD-10-CM

## 2015-08-28 DIAGNOSIS — R519 Headache, unspecified: Secondary | ICD-10-CM

## 2015-08-28 DIAGNOSIS — R51 Headache: Principal | ICD-10-CM

## 2015-08-28 DIAGNOSIS — R748 Abnormal levels of other serum enzymes: Secondary | ICD-10-CM

## 2015-08-28 DIAGNOSIS — R351 Nocturia: Secondary | ICD-10-CM

## 2015-08-28 DIAGNOSIS — R0683 Snoring: Secondary | ICD-10-CM

## 2015-08-28 NOTE — Progress Notes (Signed)
See procedure note.

## 2015-08-28 NOTE — Procedures (Signed)
[facility]  Provider:  Dr Lucia GaskinsAhern Referring Provider: Dr. Frances FurbishAthar Primary Care Physician:  Doris CheadleADVANI, DEEPAK, MD  History:  Douglas Wong is a 34 y.o. male here as a referral from Dr. Frances FurbishAthar for muscle twitching.Symptoms started 2 years ago in various parts of his body. No fasciculations on exam.   Summary  Nerve conduction studies were performed on the left upper and left lower extremities:  The left Median motor nerve showed normal conductions with normal F Wave latency The left Ulnar motor nerve showed normal conductions with normal F Wave latency The left Peroneal motor nerve showed normal conductions with normal F Wave latency The left Tibial motor nerve showed normal conductions with normal F Wave latency The left second-digit Median sensory nerve conduction was within normal limits The left fifth-digit Ulnar sensory nerve conduction was within normal limits The left Sural sensory nerve conduction was within normal limits The left Superficial Peroneal sensory nerve conduction was within normal limits Left H Reflex showed normal latency  EMG Needle study was performed on selected bilateral upper and left lower extremity muscles:   One fasciculation was seen in the right Deltoid. The left Deltoid,  Bilateral  Triceps, Bilateral  Pronator Teres, Bilateral  Opponens Pollicis, Bilateral  First Dorsal interosseous, left Iliopsoas, left Vastus Medialis, left Anterior Tibialis, left Medial Gastrocnemius, left Extensor Hallucis Longus muscles were within normal limits.  Conclusion: No electrophysiologic evidence for ulnar or median neuropathy, peripheral polyneuropathy, radiculopathy, myopathy/myositis or motor neuron disease. One fasciculation was seen in the right deltoid. 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   Naomie DeanAntonia Ahern, MD  [facility] [address] [place], [place] [id number]  Phone  [phone] Fax [phone]

## 2015-08-28 NOTE — Progress Notes (Signed)
Quick Note:  Please call and advise the patient that the recent EMG and nerve conduction velocity test, which is the electrical nerve and muscle test we we performed, was reported as within normal limits. We checked for abnormal electrical discharges in the muscles or nerves and the report suggested normal and physiological findings, slight twitching noted in the right arm. No further action is required on this test at this time. Please remind patient to keep any upcoming appointments or tests and to call us with any interim questions, concerns, problems or updates. Thanks,   Huston FoleySaima Zamarion Longest, MD, PhD   ______

## 2015-08-28 NOTE — Progress Notes (Signed)
[facility]  Provider:  Dr Dawne Casali Referring Provider: Dr. Athar Primary Care Physician:  ADVANI, DEEPAK, MD  History:  Douglas Wong is a 33 y.o. male here as a referral from Dr. Athar for muscle twitching.Symptoms started 2 years ago in various parts of his body. No fasciculations on exam.   Summary  Nerve conduction studies were performed on the left upper and left lower extremities:  The left Median motor nerve showed normal conductions with normal F Wave latency The left Ulnar motor nerve showed normal conductions with normal F Wave latency The left Peroneal motor nerve showed normal conductions with normal F Wave latency The left Tibial motor nerve showed normal conductions with normal F Wave latency The left second-digit Median sensory nerve conduction was within normal limits The left fifth-digit Ulnar sensory nerve conduction was within normal limits The left Sural sensory nerve conduction was within normal limits The left Superficial Peroneal sensory nerve conduction was within normal limits Left H Reflex showed normal latency  EMG Needle study was performed on selected bilateral upper and left lower extremity muscles:   One fasciculation was seen in the right Deltoid. The left Deltoid,  Bilateral  Triceps, Bilateral  Pronator Teres, Bilateral  Opponens Pollicis, Bilateral  First Dorsal interosseous, left Iliopsoas, left Vastus Medialis, left Anterior Tibialis, left Medial Gastrocnemius, left Extensor Hallucis Longus muscles were within normal limits.  Conclusion: No electrophysiologic evidence for ulnar or median neuropathy, peripheral polyneuropathy, radiculopathy, myopathy/myositis or motor neuron disease. One fasciculation was seen in the right deltoid. 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   Trishna Cwik, MD  [facility] [address] [place], [place] [address]  Phone  [phone] Fax [phone] 

## 2015-08-29 ENCOUNTER — Telehealth: Payer: Self-pay | Admitting: Neurology

## 2015-08-29 NOTE — Telephone Encounter (Signed)
Patient had diagnostic PSG on 08/26/15.   Please call and notify the patient that the recent sleep study did not show any significant obstructive sleep apnea. No significant drops in oxygen were noted, no significant organic sleep disorder seen. Please inform patient that he can keep our follow up appointment which is scheduled for Jan.    Thanks,  Huston FoleySaima Keiton Cosma, MD, PhD [facility])

## 2015-09-01 ENCOUNTER — Telehealth: Payer: Self-pay

## 2015-09-01 NOTE — Telephone Encounter (Signed)
I spoke patient and gave results of sleep study. He reports understanding and will keep appt in January. He will call back sooner if needed. I will fax report to PCP.

## 2015-09-01 NOTE — Telephone Encounter (Signed)
-----   Message from Huston FoleySaima Athar, MD sent at 08/28/2015  4:56 PM EDT ----- Please call and advise the patient that the recent EMG and nerve conduction velocity test, which is the electrical nerve and muscle test we we performed, was reported as within normal limits. We checked for abnormal electrical discharges in the muscles or nerves and the report suggested normal and physiological findings, slight twitching noted in the right arm. No further action is required on this test at this time. Please remind patient to keep any upcoming appointments or tests and to call us with any interim questions, concerns, problems or updates. Thanks,   Huston FoleySaima Athar, MD, PhD

## 2015-09-01 NOTE — Telephone Encounter (Signed)
I spoke to patient and gave results and recommendation. He voiced understanding and will keep January appt. He will call back sooner if needed.

## 2015-09-05 ENCOUNTER — Emergency Department (HOSPITAL_COMMUNITY)
Admission: EM | Admit: 2015-09-05 | Discharge: 2015-09-05 | Disposition: A | Discharge status: Home / Self Care | Payer: No Typology Code available for payment source | Attending: Emergency Medicine | Admitting: Emergency Medicine

## 2015-09-05 ENCOUNTER — Encounter (HOSPITAL_COMMUNITY): Payer: Self-pay | Admitting: Emergency Medicine

## 2015-09-05 DIAGNOSIS — I10 Essential (primary) hypertension: Secondary | ICD-10-CM | POA: Diagnosis not present

## 2015-09-05 DIAGNOSIS — K625 Hemorrhage of anus and rectum: Secondary | ICD-10-CM | POA: Diagnosis not present

## 2015-09-05 DIAGNOSIS — Z79899 Other long term (current) drug therapy: Secondary | ICD-10-CM | POA: Diagnosis not present

## 2015-09-05 DIAGNOSIS — R1084 Generalized abdominal pain: Secondary | ICD-10-CM | POA: Diagnosis present

## 2015-09-05 DIAGNOSIS — Z720 Tobacco use: Secondary | ICD-10-CM | POA: Insufficient documentation

## 2015-09-05 HISTORY — DX: Migraine, unspecified, not intractable, without status migrainosus: G43.909

## 2015-09-05 LAB — I-STAT CHEM 8, ED
BUN: 15 mg/dL (ref 6–20)
CHLORIDE: 102 mmol/L (ref 101–111)
Calcium, Ion: 1.19 mmol/L (ref 1.12–1.23)
Creatinine, Ser: 1 mg/dL (ref 0.61–1.24)
Glucose, Bld: 99 mg/dL (ref 65–99)
HCT: 46 % (ref 39.0–52.0)
Hemoglobin: 15.6 g/dL (ref 13.0–17.0)
POTASSIUM: 4.2 mmol/L (ref 3.5–5.1)
SODIUM: 139 mmol/L (ref 135–145)
TCO2: 26 mmol/L (ref 0–100)

## 2015-09-05 NOTE — ED Notes (Signed)
Patient c/o left sided abd pain this am, states it is now resolved. Patient states he saw blood in his stool this am. Patient states about 1 week ago he had blood in his stool x1 occurrence.

## 2015-09-05 NOTE — ED Provider Notes (Signed)
CSN: [id number]     Arrival date & time 09/05/15  1042 History   First MD Initiated Contact with Patient 09/05/15 1051     Chief Complaint  Patient presents with  . Abdominal Pain    left side  . Rectal Bleeding     (Consider location/radiation/quality/duration/timing/severity/associated sxs/prior Treatment) Patient is a 34 y.o. male presenting with abdominal pain and hematochezia. The history is provided by the patient.  Abdominal Pain Pain location:  Generalized Pain quality: cramping   Pain radiates to:  Does not radiate Pain severity:  Mild Onset quality:  Gradual Duration:  1 day Timing:  Sporadic Progression:  Resolved Chronicity:  New Context: not alcohol use, not suspicious food intake and not trauma   Relieved by:  Nothing Worsened by:  Nothing tried Ineffective treatments:  None tried Associated symptoms: hematochezia   Associated symptoms: no constipation, no diarrhea, no fever, no shortness of breath and no vomiting   Rectal Bleeding Associated symptoms: abdominal pain   Associated symptoms: no fever and no vomiting     Past Medical History  Diagnosis Date  . Alcoholic fatty liver   . Hypertension   . Migraines    Past Surgical History  Procedure Laterality Date  . No past surgeries     Family History  Problem Relation Age of Onset  . Diabetes Mother   . Hypertension Mother   . Cancer Father     liver  . Hypertension Father   . Cancer Paternal Uncle     coon cancer    Social History  Substance Use Topics  . Smoking status: Current Every Day Smoker -- 0.30 packs/day for 14 years    Types: Cigarettes  . Smokeless tobacco: Never Used  . Alcohol Use: No    Review of Systems  Constitutional: Negative for fever.  Respiratory: Negative for shortness of breath.   Gastrointestinal: Positive for abdominal pain and hematochezia. Negative for vomiting, diarrhea and constipation.  All other systems reviewed and are negative.     Allergies   Review of patient's allergies indicates no known allergies.  Home Medications   Prior to Admission medications   Medication Sig Start Date End Date Taking? Authorizing Provider  cholecalciferol (VITAMIN D) 1000 UNITS tablet Take 1,000 Units by mouth daily.    Historical Provider, MD  Multiple Vitamin (MULTIVITAMIN WITH MINERALS) TABS tablet Take 1 tablet by mouth every morning.    Historical Provider, MD   BP 151/92 mmHg  Pulse 89  Temp(Src) 97.5 F (36.4 C) (Oral)  Resp 16  Ht 5\' 11"  (1.803 m)  Wt 215 lb (97.523 kg)  BMI 30.00 kg/m2  SpO2 100% Physical Exam  Constitutional: He is oriented to person, place, and time. He appears well-developed and well-nourished. No distress.  HENT:  Head: Normocephalic and atraumatic.  Eyes: Conjunctivae are normal.  Neck: Neck supple. No tracheal deviation present.  Cardiovascular: Normal rate, regular rhythm and normal heart sounds.   Pulmonary/Chest: Effort normal and breath sounds normal. No respiratory distress.  Abdominal: Soft. Bowel sounds are normal. He exhibits no distension. There is no tenderness. There is no rebound and no guarding.  Neurological: He is alert and oriented to person, place, and time.  Skin: Skin is warm and dry.  Psychiatric: He has a normal mood and affect.    ED Course  Procedures (including critical care time) Labs Review Labs Reviewed  I-STAT CHEM 8, ED    Imaging Review No results found. I have personally reviewed and evaluated  these images and lab results as part of my medical decision-making.   EKG Interpretation None      MDM   Final diagnoses:  BRBPR (bright red blood per rectum)    34 year old male presents with small amount of bright red blood per rectum on 2 occasions in the last 2 weeks. Likely internal hemorrhoid versus fissure. Had some mild abdominal pain that has resolved completely. Primary care follow-up recommended, hemoglobin is stable from previous readings.    Lyndal Pulley, MD 09/05/15 [phone]

## 2015-09-05 NOTE — ED Notes (Signed)
Patient is alert and oriented x3.  He was given DC instructions and follow up visit instructions.  Patient gave verbal understanding.  He was DC ambulatory under his own power to home.  V/S stable.  He was not showing any signs of distress on DC 

## 2015-09-05 NOTE — ED Notes (Signed)
Patient is alert and oriented x4.  Patient states that he had a sharp pain at home and noticed that he was  Having some bright red rectal bleeding and felt he needed to get it checked out.  Patient currently denies any pain.

## 2015-09-05 NOTE — Discharge Instructions (Signed)
Gastrointestinal Bleeding °Gastrointestinal bleeding is bleeding somewhere along the path that food travels through the body (digestive tract). This path is anywhere between the mouth and the opening of the butt (anus). You may have blood in your throw up (vomit) or in your poop (stools). If there is a lot of bleeding, you may need to stay in the hospital. °HOME CARE °· Only take medicine as told by your doctor. °· Eat foods with fiber such as whole grains, fruits, and vegetables. You can also try eating 1 to 3 prunes a day. °· Drink enough fluids to keep your pee (urine) clear or pale yellow. °GET HELP RIGHT AWAY IF:  °· Your bleeding gets worse. °· You feel dizzy, weak, or you pass out (faint). °· You have bad cramps in your back or belly (abdomen). °· You have large blood clumps (clots) in your poop. °· Your problems are getting worse. °MAKE SURE YOU:  °· Understand these instructions. °· Will watch your condition. °· Will get help right away if you are not doing well or get worse. °  °This information is not intended to replace advice given to you by your health care provider. Make sure you discuss any questions you have with your health care provider. °  °Document Released: 08/10/2008 Document Revised: 10/18/2012 Document Reviewed: 04/21/2015 °Elsevier Interactive Patient Education ©2016 Elsevier Inc. ° °

## 2015-09-05 NOTE — ED Notes (Signed)
Bed: WA07 Expected date:  Expected time:  Means of arrival:  Comments: 

## 2015-09-20 ENCOUNTER — Emergency Department (HOSPITAL_COMMUNITY)
Admission: EM | Admit: 2015-09-20 | Discharge: 2015-09-21 | Disposition: A | Discharge status: Home / Self Care | Payer: No Typology Code available for payment source | Attending: Emergency Medicine | Admitting: Emergency Medicine

## 2015-09-20 ENCOUNTER — Encounter (HOSPITAL_COMMUNITY): Payer: Self-pay | Admitting: Oncology

## 2015-09-20 DIAGNOSIS — I10 Essential (primary) hypertension: Secondary | ICD-10-CM | POA: Diagnosis not present

## 2015-09-20 DIAGNOSIS — R1031 Right lower quadrant pain: Secondary | ICD-10-CM | POA: Diagnosis not present

## 2015-09-20 DIAGNOSIS — Z792 Long term (current) use of antibiotics: Secondary | ICD-10-CM | POA: Insufficient documentation

## 2015-09-20 DIAGNOSIS — Z720 Tobacco use: Secondary | ICD-10-CM | POA: Diagnosis not present

## 2015-09-20 DIAGNOSIS — R112 Nausea with vomiting, unspecified: Secondary | ICD-10-CM | POA: Insufficient documentation

## 2015-09-20 DIAGNOSIS — Z79899 Other long term (current) drug therapy: Secondary | ICD-10-CM | POA: Diagnosis not present

## 2015-09-20 DIAGNOSIS — Z8719 Personal history of other diseases of the digestive system: Secondary | ICD-10-CM | POA: Diagnosis not present

## 2015-09-20 LAB — COMPREHENSIVE METABOLIC PANEL
ALT: 114 U/L — AB (ref 17–63)
AST: 58 U/L — AB (ref 15–41)
Albumin: 5.1 g/dL — ABNORMAL HIGH (ref 3.5–5.0)
Alkaline Phosphatase: 81 U/L (ref 38–126)
Anion gap: 11 (ref 5–15)
BUN: 13 mg/dL (ref 6–20)
CALCIUM: 10.1 mg/dL (ref 8.9–10.3)
CO2: 27 mmol/L (ref 22–32)
CREATININE: 1.04 mg/dL (ref 0.61–1.24)
Chloride: 103 mmol/L (ref 101–111)
Glucose, Bld: 95 mg/dL (ref 65–99)
Potassium: 3.6 mmol/L (ref 3.5–5.1)
Sodium: 141 mmol/L (ref 135–145)
Total Bilirubin: 0.8 mg/dL (ref 0.3–1.2)
Total Protein: 8.9 g/dL — ABNORMAL HIGH (ref 6.5–8.1)

## 2015-09-20 LAB — URINALYSIS, ROUTINE W REFLEX MICROSCOPIC
BILIRUBIN URINE: NEGATIVE
GLUCOSE, UA: NEGATIVE mg/dL
Hgb urine dipstick: NEGATIVE
Ketones, ur: NEGATIVE mg/dL
Leukocytes, UA: NEGATIVE
Nitrite: NEGATIVE
PH: 5.5 (ref 5.0–8.0)
Protein, ur: NEGATIVE mg/dL
Specific Gravity, Urine: 1.01 (ref 1.005–1.030)
Urobilinogen, UA: 0.2 mg/dL (ref 0.0–1.0)

## 2015-09-20 LAB — CBC
HCT: 42.3 % (ref 39.0–52.0)
Hemoglobin: 15 g/dL (ref 13.0–17.0)
MCH: 30.3 pg (ref 26.0–34.0)
MCHC: 35.5 g/dL (ref 30.0–36.0)
MCV: 85.5 fL (ref 78.0–100.0)
PLATELETS: 195 10*3/uL (ref 150–400)
RBC: 4.95 MIL/uL (ref 4.22–5.81)
RDW: 12.6 % (ref 11.5–15.5)
WBC: 5.2 10*3/uL (ref 4.0–10.5)

## 2015-09-20 LAB — LIPASE, BLOOD: LIPASE: 38 U/L (ref 11–51)

## 2015-09-20 NOTE — ED Notes (Signed)
Pt got home from work and drank one beer and began to vomit.  Per pt he vomited x 3.  Pt states small seeing small amount of blood in emesis. Denies abdominal pain.  Seen last week for GI bleed has not followed up.

## 2015-09-20 NOTE — ED Provider Notes (Signed)
CSN: [id number]     Arrival date & time 09/20/15  2139 History   First MD Initiated Contact with Patient 09/20/15 2242     Chief Complaint  Patient presents with  . Nausea     (Consider location/radiation/quality/duration/timing/severity/associated sxs/prior Treatment) HPI Douglas Wong is a 34 y.o. male who comes in for evaluation of nausea. Patient reports around a close evening became suddenly nauseous and threw up 3 times. Patient reports he saw a little bit of blood at the end of his emesis after the third time throwing up. Denies any abdominal pain, fevers, chills, urinary symptoms, constipation or diarrhea. He does report he ate a shrimp quesadilla earlier and is unsure if that is contributing to his symptoms. Patient does not admit this to me, but reports to the nurse that he got home from work and drank 1 beer and began to vomit. He hasn't tried anything to improve his symptoms. Nothing seems to make it better or worse. He has not thrown up or see nauseous in the ED now. No other modifying factors. Past Medical History  Diagnosis Date  . Alcoholic fatty liver   . Hypertension   . Migraines    Past Surgical History  Procedure Laterality Date  . No past surgeries     Family History  Problem Relation Age of Onset  . Diabetes Mother   . Hypertension Mother   . Cancer Father     liver  . Hypertension Father   . Cancer Paternal Uncle     coon cancer    Social History  Substance Use Topics  . Smoking status: Current Every Day Smoker -- 0.30 packs/day for 14 years    Types: Cigarettes  . Smokeless tobacco: Never Used  . Alcohol Use: No    Review of Systems A 10 point review of systems was completed and was negative except for pertinent positives and negatives as mentioned in the history of present illness     Allergies  Review of patient's allergies indicates no known allergies.  Home Medications   Prior to Admission medications   Medication Sig Start Date End Date  Taking? Authorizing Provider  cholecalciferol (VITAMIN D) 1000 UNITS tablet Take 1,000 Units by mouth daily.   Yes Historical Provider, MD  lactobacillus acidophilus & bulgar (LACTINEX) chewable tablet Chew 1 tablet by mouth 2 (two) times daily.   Yes Historical Provider, MD  doxycycline (VIBRA-TABS) 100 MG tablet Take 100 mg by mouth 2 (two) times daily. 09/03/15   Historical Provider, MD  ondansetron (ZOFRAN) 4 MG tablet Take 1 tablet (4 mg total) by mouth every 6 (six) hours. 09/21/15   Honesty Menta, PA-C   BP 142/92 mmHg  Pulse 84  Temp(Src) 97.9 F (36.6 C) (Oral)  Resp 18  Ht 5\' 11"  (1.803 m)  Wt 217 lb (98.431 kg)  BMI 30.28 kg/m2  SpO2 100% Physical Exam  Constitutional: He is oriented to person, place, and time. He appears well-developed and well-nourished.  HENT:  Head: Normocephalic and atraumatic.  Mouth/Throat: Oropharynx is clear and moist.  Eyes: Conjunctivae are normal. Pupils are equal, round, and reactive to light. Right eye exhibits no discharge. Left eye exhibits no discharge. No scleral icterus.  Neck: Neck supple.  Cardiovascular: Normal rate, regular rhythm and normal heart sounds.   Pulmonary/Chest: Effort normal and breath sounds normal. No respiratory distress. He has no wheezes. He has no rales.  Abdominal: Soft.  Very Mild right lower quadrant tenderness. Abdomen is otherwise soft, nondistended  and nontender with no lesions or deformities. No rebound or guarding  Musculoskeletal: He exhibits no tenderness.  Neurological: He is alert and oriented to person, place, and time.  Cranial Nerves II-XII grossly intact  Skin: Skin is warm and dry. No rash noted.  Psychiatric: He has a normal mood and affect.  Nursing note and vitals reviewed.   ED Course  Procedures (including critical care time) Labs Review Labs Reviewed  COMPREHENSIVE METABOLIC PANEL - Abnormal; Notable for the following:    Total Protein 8.9 (*)    Albumin 5.1 (*)    AST 58 (*)     ALT 114 (*)    All other components within normal limits  LIPASE, BLOOD  CBC  URINALYSIS, ROUTINE W REFLEX MICROSCOPIC (NOT AT [facility])    Imaging Review No results found. I have personally reviewed and evaluated these images and lab results as part of my medical decision-making.   EKG Interpretation None     Filed Vitals:   09/20/15 2147 09/20/15 2355 09/21/15 0015  BP: 154/93 146/98 142/92  Pulse: 83 88 84  Temp: 97.9 F (36.6 C)    TempSrc: Oral    Resp: Height:  (1.803 m)    Weight: 217 lb (98.431 kg)    SpO2: 100% 100% 100%   Meds given in ED:  Medications - No data to display  Discharge Medication List as of 09/21/2015 12:10 AM    START taking these medications   Details  ondansetron (ZOFRAN) 4 MG tablet Take 1 tablet (4 mg total) by mouth every 6 (six) hours., Starting 09/21/2015, Until Discontinued, Print         MDM  Douglas Wong is a 34 y.o. male who comes in for evaluation of an isolated episode of nausea and vomiting. Patient has not had any nausea or vomiting in the ED and is currently drinking ginger ale without any problems. Very mild right lower quadrant pain. I have low suspicion for appendicitis at this time, however I gave strict return precautions and patient verbalizes understanding. Labs are noncontributory, no leukocytosis. Will write for anti-emetics and encourage continued fluid rehydration at home. Patient verbalizes understanding and agrees with this plan. Overall, patient appears well, nontoxic, hemodynamically stable with normal vital signs and is appropriate for discharge. Final diagnoses:  Non-intractable vomiting with nausea, vomiting of unspecified type        Joycie Peek, PA-C 09/21/15 1631  Donnetta Hutching, MD 09/21/15 1857

## 2015-09-20 NOTE — ED Notes (Signed)
Around 2000 tonight pt became nauseous and threw up 3 times. Pt states he saw some blood in his emesis, but he suspects it was related to throwing up. Pt is relaxed and texting during assessment. Pt denies any pain at this time

## 2015-09-21 MED ORDER — ONDANSETRON HCL 4 MG PO TABS: 4.0000 mg | ORAL_TABLET | Freq: Four times a day (QID) | ORAL | Status: DC

## 2015-09-21 NOTE — Discharge Instructions (Signed)
There does not appear to be any emergent causes for your symptoms at this time. Preseizure antinausea medicine as needed. It is important to return to your PCP in 2-3 days for reevaluation of your abdominal discomfort. Return to ED for worsening abdominal pain, fevers, worsening nausea or vomiting as this can be signs of early appendicitis.  Nausea and Vomiting Nausea is a sick feeling that often comes before throwing up (vomiting). Vomiting is a reflex where stomach contents come out of your mouth. Vomiting can cause severe loss of body fluids (dehydration). Children and elderly adults can become dehydrated quickly, especially if they also have diarrhea. Nausea and vomiting are symptoms of a condition or disease. It is important to find the cause of your symptoms. CAUSES   Direct irritation of the stomach lining. This irritation can result from increased acid production (gastroesophageal reflux disease), infection, food poisoning, taking certain medicines (such as nonsteroidal anti-inflammatory drugs), alcohol use, or tobacco use.  Signals from the brain.These signals could be caused by a headache, heat exposure, an inner ear disturbance, increased pressure in the brain from injury, infection, a tumor, or a concussion, pain, emotional stimulus, or metabolic problems.  An obstruction in the gastrointestinal tract (bowel obstruction).  Illnesses such as diabetes, hepatitis, gallbladder problems, appendicitis, kidney problems, cancer, sepsis, atypical symptoms of a heart attack, or eating disorders.  Medical treatments such as chemotherapy and radiation.  Receiving medicine that makes you sleep (general anesthetic) during surgery. DIAGNOSIS Your caregiver may ask for tests to be done if the problems do not improve after a few days. Tests may also be done if symptoms are severe or if the reason for the nausea and vomiting is not clear. Tests may include:  Urine tests.  Blood tests.  Stool  tests.  Cultures (to look for evidence of infection).  X-rays or other imaging studies. Test results can help your caregiver make decisions about treatment or the need for additional tests. TREATMENT You need to stay well hydrated. Drink frequently but in small amounts.You may wish to drink water, sports drinks, clear broth, or eat frozen ice pops or gelatin dessert to help stay hydrated.When you eat, eating slowly may help prevent nausea.There are also some antinausea medicines that may help prevent nausea. HOME CARE INSTRUCTIONS   Take all medicine as directed by your caregiver.  If you do not have an appetite, do not force yourself to eat. However, you must continue to drink fluids.  If you have an appetite, eat a normal diet unless your caregiver tells you differently.  Eat a variety of complex carbohydrates (rice, wheat, potatoes, bread), lean meats, yogurt, fruits, and vegetables.  Avoid high-fat foods because they are more difficult to digest.  Drink enough water and fluids to keep your urine clear or pale yellow.  If you are dehydrated, ask your caregiver for specific rehydration instructions. Signs of dehydration may include:  Severe thirst.  Dry lips and mouth.  Dizziness.  Dark urine.  Decreasing urine frequency and amount.  Confusion.  Rapid breathing or pulse. SEEK IMMEDIATE MEDICAL CARE IF:   You have blood or brown flecks (like coffee grounds) in your vomit.  You have black or bloody stools.  You have a severe headache or stiff neck.  You are confused.  You have severe abdominal pain.  You have chest pain or trouble breathing.  You do not urinate at least once every 8 hours.  You develop cold or clammy skin.  You continue to vomit for longer  than 24 to 48 hours.  You have a fever. MAKE SURE YOU:   Understand these instructions.  Will watch your condition.  Will get help right away if you are not doing well or get worse.   This  information is not intended to replace advice given to you by your health care provider. Make sure you discuss any questions you have with your health care provider.   Document Released: 11/01/2005 Document Revised: 01/24/2012 Document Reviewed: 03/31/2011 Elsevier Interactive Patient Education [employer]2016 Elsevier Inc.

## 2015-09-30 ENCOUNTER — Telehealth: Payer: Self-pay | Admitting: Neurology

## 2015-09-30 NOTE — Telephone Encounter (Signed)
Patient called to request appointment with Dr. Terrace ArabiaYan for muscle twitches. Has a friend who saw Dr. Terrace ArabiaYan and seemed to help him a little better.

## 2015-09-30 NOTE — Telephone Encounter (Signed)
Please can you look into this, Dr. Terrace ArabiaYan, patient is requesting to see you. Please review chart and perhaps provide a second opinion?

## 2015-10-01 NOTE — Telephone Encounter (Signed)
Marcelino DusterMichelle, would you please put him on my schedule. Thanks.

## 2015-10-01 NOTE — Telephone Encounter (Signed)
Spoke to patient - scheduled for 10/20/15.

## 2015-10-20 ENCOUNTER — Encounter: Payer: Self-pay | Admitting: Neurology

## 2015-10-20 ENCOUNTER — Ambulatory Visit (INDEPENDENT_AMBULATORY_CARE_PROVIDER_SITE_OTHER): Payer: No Typology Code available for payment source | Admitting: Neurology

## 2015-10-20 VITALS — BP 140/87 | HR 80 | Ht 71.0 in | Wt 216.0 lb

## 2015-10-20 DIAGNOSIS — R748 Abnormal levels of other serum enzymes: Secondary | ICD-10-CM

## 2015-10-20 DIAGNOSIS — R253 Fasciculation: Secondary | ICD-10-CM | POA: Insufficient documentation

## 2015-10-20 MED ORDER — GABAPENTIN 100 MG PO CAPS: 100.0000 mg | ORAL_CAPSULE | Freq: Three times a day (TID) | ORAL | Status: DC

## 2015-10-20 NOTE — Progress Notes (Signed)
PATIENT: Douglas Wong  DOB: 1981-01-21  Chief Complaint  Patient presents with  . Muscle Twitching    He is here for a second opinion for his intermittent, full body muscle twitching.     HISTORICAL  Douglas Wong is a 34 years old right-handed male, seen in refer by Dr.Athar for evaluation of frequent muscle twitching, mild elevated CPK 329  Since 2014, he noticed upper back area deep muscle achy pain, but denies significant low back pain, no radiating pain to bilateral lower extremity, he denies muscle weakness, no gait difficulty, around that time, he also noticed intermittent muscle twitching, initially involving his leg, couple times a day, lasting for few seconds, since 2015, he has increased intermittent muscle twitching, involving different body parts randomly, arms, abdomen muscles, short lasting, no significant limb muscle weakness, no weakness, but he complains feeling tired all the time  He also complains symptoms of depression anxiety, he used to drink beer moderately daily, has quit since 2015,  He had extensive evaluation over the past few months, EMG nerve conduction study in August 28 2015 was normal Sleep study was normal  I have reviewed laboratory evaluation, normal or negative CBC, CMP, RPR, TSH, vitamin D, A1c, serum corpora, ceruloplasmin level, methylmalonic level, vitamin B1 and B6, C reactive protein, ANA,  The only abnormalities mild elevated CPK, most recent was 329.  I also personally reviewed MRI of the brain February 2016, slight supratentorium small vessel disease no acute abnormality.  REVIEW OF SYSTEMS: Full 14 system review of systems performed and notable only for dizziness, headaches, numbness, depression anxiety back pain  ALLERGIES: No Known Allergies  HOME MEDICATIONS: Current Outpatient Prescriptions  Medication Sig Dispense Refill  . cholecalciferol (VITAMIN D) 1000 UNITS tablet Take 1,000 Units by mouth daily.     No current  facility-administered medications for this visit.    PAST MEDICAL HISTORY: Past Medical History  Diagnosis Date  . Alcoholic fatty liver   . Hypertension   . Migraines     PAST SURGICAL HISTORY: Past Surgical History  Procedure Laterality Date  . No past surgeries      FAMILY HISTORY: Family History  Problem Relation Age of Onset  . Diabetes Mother   . Hypertension Mother   . Cancer Father     liver  . Hypertension Father   . Cancer Paternal Uncle     coon cancer     SOCIAL HISTORY:  Social History   Social History  . Marital Status: Married    Spouse Name: Douglas Wong  . Number of Children: 4  . Years of Education: GED   Occupational History  .      [employer]   Social History Main Topics  . Smoking status: Current Every Day Smoker -- 0.30 packs/day for 14 years    Types: Cigarettes  . Smokeless tobacco: Never Used  . Alcohol Use: No  . Drug Use: No  . Sexual Activity: Not on file   Other Topics Concern  . Not on file   Social History Narrative   Patient lives at home with his wife Douglas Bongo(Jalisa)   Patient works full time.   Education GED.   Right handed.   Caffeine sometimes not daily.     PHYSICAL EXAM   Filed Vitals:   10/20/15 0730  BP: 140/87  Pulse: 80  Height: 5\' 11"  (1.803 m)  Weight: 216 lb (97.977 kg)    Not recorded      Body  mass index is 30.14 kg/(m^2).  PHYSICAL EXAMNIATION:  Gen: NAD, conversant, well nourised, obese, well groomed                     Cardiovascular: Regular rate rhythm, no peripheral edema, warm, nontender. Eyes: Conjunctivae clear without exudates or hemorrhage Neck: Supple, no carotid bruise. Pulmonary: Clear to auscultation bilaterally   NEUROLOGICAL EXAM:  MENTAL STATUS: Speech:    Speech is normal; fluent and spontaneous with normal comprehension.  Cognition:     Orientation to time, place and person     Normal recent and remote memory     Normal Attention span and concentration     Normal  Language, naming, repeating,spontaneous speech     Fund of knowledge   CRANIAL NERVES: CN II: Visual fields are full to confrontation. Fundoscopic exam is normal with sharp discs and no vascular changes. Pupils are round equal and briskly reactive to light. CN III, IV, VI: extraocular movement are normal. No ptosis. CN V: Facial sensation is intact to pinprick in all 3 divisions bilaterally. Corneal responses are intact.  CN VII: Face is symmetric with normal eye closure and smile. CN VIII: Hearing is normal to rubbing fingers CN IX, X: Palate elevates symmetrically. Phonation is normal. CN XI: Head turning and shoulder shrug are intact CN XII: Tongue is midline with normal movements and no atrophy.  MOTOR: There is no pronator drift of out-stretched arms. Muscle bulk and tone are normal. Muscle strength is normal.  REFLEXES: Reflexes are 2+ and symmetric at the biceps, triceps, knees, and ankles. Plantar responses are flexor.  SENSORY: Intact to light touch, pinprick, position sense, and vibration sense are intact in fingers and toes.  COORDINATION: Rapid alternating movements and fine finger movements are intact. There is no dysmetria on finger-to-nose and heel-knee-shin.    GAIT/STANCE: Posture is normal. Gait is steady with normal steps, base, arm swing, and turning. Heel and toe walking are normal. Tandem gait is normal.  Romberg is absent.   DIAGNOSTIC DATA (LABS, IMAGING, TESTING) - I reviewed patient records, labs, notes, testing and imaging myself where available.  ASSESSMENT AND PLAN  Douglas Wong is a 34 y.o. male   Benign muscle fasciculations  There was no evidence of muscle atrophy, or weakness  Extensive evaluation detailed above includes laboratory evaluation, EMG nerve conduction study failed to demonstrate etiology   Have suggested gabapentin 100 mg 3 times a day, for symptomatic control Mild elevated CPK  Most recent CPK level was 329, this is most likely  related to the fact that he is African-American male, with big body muscle mass  There was no evidence of inflammatory myopathy.  He is to continue follow-up with Dr. Frances Furbish as previously scheduled  Levert Feinstein, M.D. Ph.D.  [facility] [address], [address] [place], [place] [address] Ph: [phone] Fax: [phone]  CC: Referring Provider

## 2015-10-22 DIAGNOSIS — R748 Abnormal levels of other serum enzymes: Secondary | ICD-10-CM | POA: Insufficient documentation

## 2015-11-18 ENCOUNTER — Telehealth: Payer: Self-pay

## 2015-11-18 ENCOUNTER — Ambulatory Visit: Payer: No Typology Code available for payment source | Admitting: Neurology

## 2015-11-18 NOTE — Telephone Encounter (Signed)
Patient was just seen here recently for 2nd opinion. Per Dr. Frances FurbishAthar, patient does not need to be seen until April. I spoke with patient and he had no new concerns and was agreeable to reschedule. Appt set in April.

## 2016-02-16 ENCOUNTER — Ambulatory Visit (INDEPENDENT_AMBULATORY_CARE_PROVIDER_SITE_OTHER): Payer: BLUE CROSS/BLUE SHIELD | Admitting: Neurology

## 2016-02-16 ENCOUNTER — Encounter: Payer: Self-pay | Admitting: Neurology

## 2016-02-16 VITALS — BP 128/91 | HR 72 | Resp 16 | Ht 71.0 in | Wt 209.0 lb

## 2016-02-16 DIAGNOSIS — R258 Other abnormal involuntary movements: Secondary | ICD-10-CM | POA: Diagnosis not present

## 2016-02-16 DIAGNOSIS — M549 Dorsalgia, unspecified: Secondary | ICD-10-CM

## 2016-02-16 DIAGNOSIS — R253 Fasciculation: Secondary | ICD-10-CM

## 2016-02-16 NOTE — Progress Notes (Signed)
Subjective:    Patient ID: Douglas Wong is a 35 y.o. male.  HPI     Interim history:   Douglas Wong is a 34 year old right-handed gentleman with an underlying medical history of smoking, anxiety and depression (on Prozac as a teenager), alcoholic fatty liver, and reflux disease, who presents for follow-up consultation of his involuntary muscle twitching. The patient is unaccompanied today. I last saw him on 07/15/2015, at which time he reported ongoing issues with HAs, but also nasal congestion, which started in 2016. He had muscle twitching in different places. He worked out regularly. He endorsed snoring and morning headaches, which were dull, in the frontal areas. He did not always wake up rested, averaging about 5 hours a night. I suggested repeat EMG/NVC testing and a sleep study as well as a second opinion with one of our neuromuscular specialists.   He had EMG/NCV testing on 08/28/15 with Dr. Jaynee Eagles, which I reviewed with him today: No electrophysiologic evidence for ulnar or median neuropathy, peripheral polyneuropathy, radiculopathy, myopathy/myositis or motor neuron disease. One fasciculation was seen in the right deltoid. 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  He had a sleep study on 08/27/2015 and I reviewed the test results with him today: Sleep latency was prolonged at 45 minutes, sleep efficiency was 80.2%. Wake after sleep onset was 29 minutes with mild sleep fragmentation noted. He had an increased percentage of stage II sleep at 85.2%, absence of slow-wave sleep and a decreased percentage of REM sleep at 13.3% with a mildly prolonged REM latency of 129.5 minutes. He had no significant PLMS, no significant EKG or EEG changes. He had mild to moderate intermittent snoring. Total AHI was 0.4 per hour, average oxygen saturation 96%, nadir was 89%.   He also had an office visit with Dr. Krista Blue on 10/20/2015 and I reviewed the office note. I appreciate  her input. Her impression was benign fasciculation syndrome.  Today, 02/16/2016: He reports feeling about the same, as far as the muscle twitching is concerned. For the past 2 years he has had midback pain off and on. There is a particular spot below his left shoulder blade that bothers him. He has been to a Restaurant manager, fast food. He was given a prescription for each of the lack. He says he does not like to take medications. Very occasionally will he take an over-the-counter medication. He feels less bothered by back pain on his days off. Otherwise, he has no new symptoms. Overall, he feels he has done better regarding his muscle twitching, occasionally he will have a twitch in his face. He does sometimes wake up with tightness in his jaw and some muscle soreness in his temples.  Previously:    I saw him on 03/13/2015, at which time he reported feeling about the same. He felt that his muscle twitching was a little better. He had noted some muscle twitching around his left eye. He was reporting recurrent headaches. He had seen ENT. He was taking Advil migraine as needed for this. He had tried Phenergan about 6 or 7 months prior which he did not think was helpful for nausea. He had occasional nausea but no vomiting with the headache. His exam was benign. I suggested Fioricet as needed for headaches but counseled him on headache triggers extensively. We checked a CK level on 03/13/2015, which was improved from last time down to 312 from previously 410, and we called him with his test results. His aldolase was normal at 8.1.  I first met him on 12/12/2014 at the request of his ENT physician, at which time he reported a 2 year history of muscle twitching in various areas of his body, including his thighs, shoulders, around his eyes, but denied any weakness, atrophy, numbness or pain. I suggested further workup in the form of labs: CBC with differential was unremarkable, CMP unremarkable, B12 and folate unremarkable,  RPR nonreactive, TSH normal, vitamin D normal, hemoglobin A1c normal, serum copper and ceruloplasmin normal, methylmalonic acid normal, vitamin B6 and B1 normal, CK level normal, CRP normal,  ANA normal. We called him with his test results. I also requested a brain MRI without contrast. He had this on 12/18/2014: Mildly abnormal MRI brain (without) demonstrating: 1. Scattered periventricular and subcortical and juxtacortical foci of T2 hyperintensities. These findings are non-specific and considerations include autoimmune, inflammatory, post-infectious, microvascular ischemic or migraine associated etiologies..   2. No acute findings. We called him with his test results. In addition, personally reviewed the images through the PACS system and I also shared some images on the computer with him.   In addition, he reports several other issues for the past months or years including recurrent headaches in the right frontal area, feeling of dizziness and balance problems, feeling lightheaded and faint, concentration problems, focusing problems, feeling "out of it", depressive symptoms and anxiety issues. He has a history of migraines since he was a child he states. He has taken Excedrin Migraine for this. He has some associated phono and sonophobia but no nausea or vomiting. Headaches are particularly common in the right frontal area.   He was diagnosed and treated for depression when he was a teenager and was on Prozac at the time but did not continue the medication and did not go back to his doctor. He has not yet talked to his primary care physician about his anxiety, depression and focus problems he says. He denies a family history of ALS/Lou Gehrig's disease, myasthenia gravis, neuropathy or tic disorder or Tourette's syndrome. His symptoms come and go. Currently he does not feel any twitching. It can be on a day-to-day basis. Twitching lasts for seconds at a time. It may return in the same area go to another  muscle area. He has been less good with his exercise routine. He tries to drink water. He does not drink a lot of sodas or caffeine at this time. He may drink a soda every other week or so. He quit drinking alcohol altogether in March of last year. He was drinking regularly but was never considered a heavy drinker. He was drinking 1-2 beers per day typically at the time. He was told that he had either alcoholic fatty liver or fatty liver from taking too much Tylenol. He was taking Tylenol every day because of toothaches at the time. He does not take any illicit drugs and does not have a history of illicit drug use. He lives with his wife and 2 children. He has 2 other children who live with their mothers. He works 2 jobs. He does endorse some stress. He typically goes to bed around 1 AM and has a wake time of 7:30 AM. He estimates he gets about 6 hours of sleep on an average. For reflux symptoms he has been taking Prilosec OTC. He snores but denies any apneic pauses or gasping sensations while asleep. He denies suicidal or homicidal ideations.  I reviewed his electronic chart. In the past year and a half he had numerous emergency room and  urgent care visits for various complaints including headaches, back pain, abdominal pain, sore throat, panic attack, numbness and tingling, twitching around his eyes as well as chest pains. I reviewed pertinent lab results which included normal vitamin B12 level on 09/02/2014, normal hemoglobin A1c on 07/31/2014, normal TSH, vitamin D level was low on 07/31/2014 at 17, on 08/15/2014 he had negative hepatitis screen for hepatitis A,  B and C, negative HIV, negative ANA.   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 He was advised that there is typically no good treatment for this problem and it may  resolve over time he was advised to avoid caffeine and nicotine.  His Past Medical History Is Significant For: Past Medical History  Diagnosis Date  . Alcoholic fatty liver   . Hypertension   . Migraines     His Past Surgical History Is Significant For: Past Surgical History  Procedure Laterality Date  . No past surgeries      His Family History Is Significant For: Family History  Problem Relation Age of Onset  . Diabetes Mother   . Hypertension Mother   . Cancer Father     liver  . Hypertension Father   . Cancer Paternal Uncle     coon cancer     His Social History Is Significant For: Social History   Social History  . Marital Status: Married    Spouse Name: Douglas Wong  . Number of Children: 4  . Years of Education: GED   Occupational History  .      [place]   Social History Main Topics  . Smoking status: Current Every Day Smoker -- 0.30 packs/day for 14 years    Types: Cigarettes  . Smokeless tobacco: Never Used  . Alcohol Use: No  . Drug Use: No  . Sexual Activity: Not Asked   Other Topics Concern  . None   Social History Narrative   Patient lives at home with his wife Douglas Wong)   Patient works full time.   Education GED.   Right handed.   Caffeine sometimes not daily.    His Allergies Are:  No Known Allergies:   His Current Medications Are:  Outpatient Encounter Prescriptions as of 02/16/2016  Medication Sig  . cholecalciferol (VITAMIN D) 1000 UNITS tablet Take 1,000 Units by mouth daily.  Marland Kitchen etodolac (LODINE) 400 MG tablet   . gabapentin (NEURONTIN) 100 MG capsule Take 1 capsule (100 mg total) by mouth 3 (three) times daily.   No facility-administered encounter medications on file as of 02/16/2016.  :  Review of Systems:  Out of a complete 14 point review of systems, all are reviewed and negative with the exception of these symptoms as listed below:   Review of Systems  Neurological:       Patient reports that he is having some back pain,  wonders if it is muscle or nerve related?  Still having all over muscle twitching.     Objective:  Neurologic Exam  Physical Exam Physical Examination:   Filed Vitals:   02/16/16 0942  BP: 128/91  Pulse: 72  Resp: 16   General Examination: The patient is a 35 y.o. male in no acute distress. He appears well-developed and well-nourished and well groomed. He is in good spirits today.  HEENT: Normocephalic,  atraumatic, pupils are equal, round and reactive to light and accommodation. Funduscopic exam is normal with sharp disc margins noted. I did not see any twitching around his eyes or face. He has no abnormal involuntary movements in his face. Extraocular tracking is good without limitation to gaze excursion or nystagmus noted. Normal smooth pursuit is noted. Hearing is grossly intact. Face is symmetric with normal facial animation and normal facial sensation. Speech is clear with no dysarthria noted. There is no hypophonia. There is no lip, neck/head, jaw or voice tremor. Neck is supple with full range of passive and active motion. There are no carotid bruits on auscultation. Oropharynx exam reveals: mild mouth dryness, good dental hygiene and mild airway crowding, secondary to narrow airway entry, tonsils are 1+ bilaterally. He has mild pharyngeal erythema. Uvula is thin and elongated. Mallampati is class I.   Chest: Clear to auscultation without wheezing, rhonchi or crackles noted.  Heart: S1+S2+0, regular and normal without murmurs, rubs or gallops noted.   Abdomen: Soft, non-tender and non-distended with normal bowel sounds appreciated on auscultation.  Extremities: There is no pitting edema in the distal lower extremities bilaterally. Pedal pulses are intact.  Skin: Warm and dry without trophic changes noted. There are no varicose veins.  Musculoskeletal: exam reveals no obvious joint deformities, tenderness or joint swelling or erythema.   Neurologically:  Mental status: The  patient is awake, alert and oriented in all 4 spheres. His immediate and remote memory, attention, language skills and fund of knowledge are appropriate. There is no evidence of aphasia, agnosia, apraxia or anomia. Speech is clear with normal prosody and enunciation. Thought process is linear. Mood is normal and affect is normal.   Cranial nerves II - XII are as described above under HEENT exam. In addition: shoulder shrug is normal with equal shoulder height noted. Motor exam: Normal bulk, strength and tone is noted. In particular, there is no atrophy, no fasciculations are noted and no myoclonus or athetoid movements, no dystonic posturing. He has no pain on palpation of his arms and legs. Findings are stable. There is no drift, tremor or rebound. Romberg is negative. Reflexes are 2+ throughout. Fine motor skills and coordination: intact with normal finger taps, normal hand movements, normal rapid alternating patting, normal foot taps and normal foot agility.  Cerebellar testing: No dysmetria or intention tremor on finger to nose testing. Heel to shin is unremarkable bilaterally. There is no truncal or gait ataxia.  Sensory exam: intact to light touch, pinprick, vibration, temperature sense in the upper and lower extremities.  Gait, station and balance: He stands easily. No veering to one side is noted. No leaning to one side is noted. Posture is age-appropriate and stance is narrow based. Gait shows normal stride length and normal pace. No problems turning are noted. He turns en bloc. Tandem walk is unremarkable.   Assessment and Plan:   In summary, SHELDEN RABORN is a very pleasant 35 year old male with an underlying medical history of smoking, anxiety and depression (on Prozac as a teenager), (?alcoholic) fatty liver, and reflux disease, who reports a 2 to 3 + year history of muscle twitching in various areas of his body, including thighs, shoulder areas and around the eyes, and face, intermittent, and  not progressive. He had a benign sleep study. We talked about test results. We talked about his EMG and nerve conduction testing as well. We talked about his symptoms. He feels somewhat improved in the recent past. He has had  some dull headaches and jaw tightness and tenderness in his temporal muscles at times first thing in the morning, he may be grinding his teeth. We talked about his mid back pain as well. He has had this intermittently for 2 years. He is advised to try that etodolic and he was given and consider using a microwavable heat pad. He is strongly advised not to use an electric heat pad at night as he may be at risk of falling asleep on it. He can sustain burns if he falls asleep on an electric heat pad.  He is advised to stay well-hydrated. He has been on a vitamin D supplement. He has limited his caffeine intake and increase his water intake. He tries to be active physically and exercises regularly.  He has a benign exam today and had a benign workup. 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 At this juncture, I suggested a one-year checkup, sooner if needed. I answered all his questions today and he was in agreement.  I spent 20 minutes in total face-to-face time with the patient, more than 50% of which was spent in counseling and coordination of care, reviewing test results, reviewing medication and discussing or reviewing the diagnosis of muscle twitching and mid back pain, the prognosis and treatment options.

## 2016-02-16 NOTE — Patient Instructions (Addendum)
Your exam looks good.  For occasional neck and mid back pain, try over the counter Advil and a microwave heat pad.  If you have more prominent back pain, you can see your primary care physician too.  I can see you back infrequently as you have done well from my end of things.  Let's make an appointment for 1 year, we can move it up, if needed.

## 2016-04-12 IMAGING — CR DG CHEST 2V
2 series · 2 of 2 positions shown · non-contrast
Comparison: Chest x-ray 02/02/2014.

CLINICAL DATA: 33-year-old male with a history of upper back pain.

EXAM:
CHEST - 2 VIEW

[w chest pa]
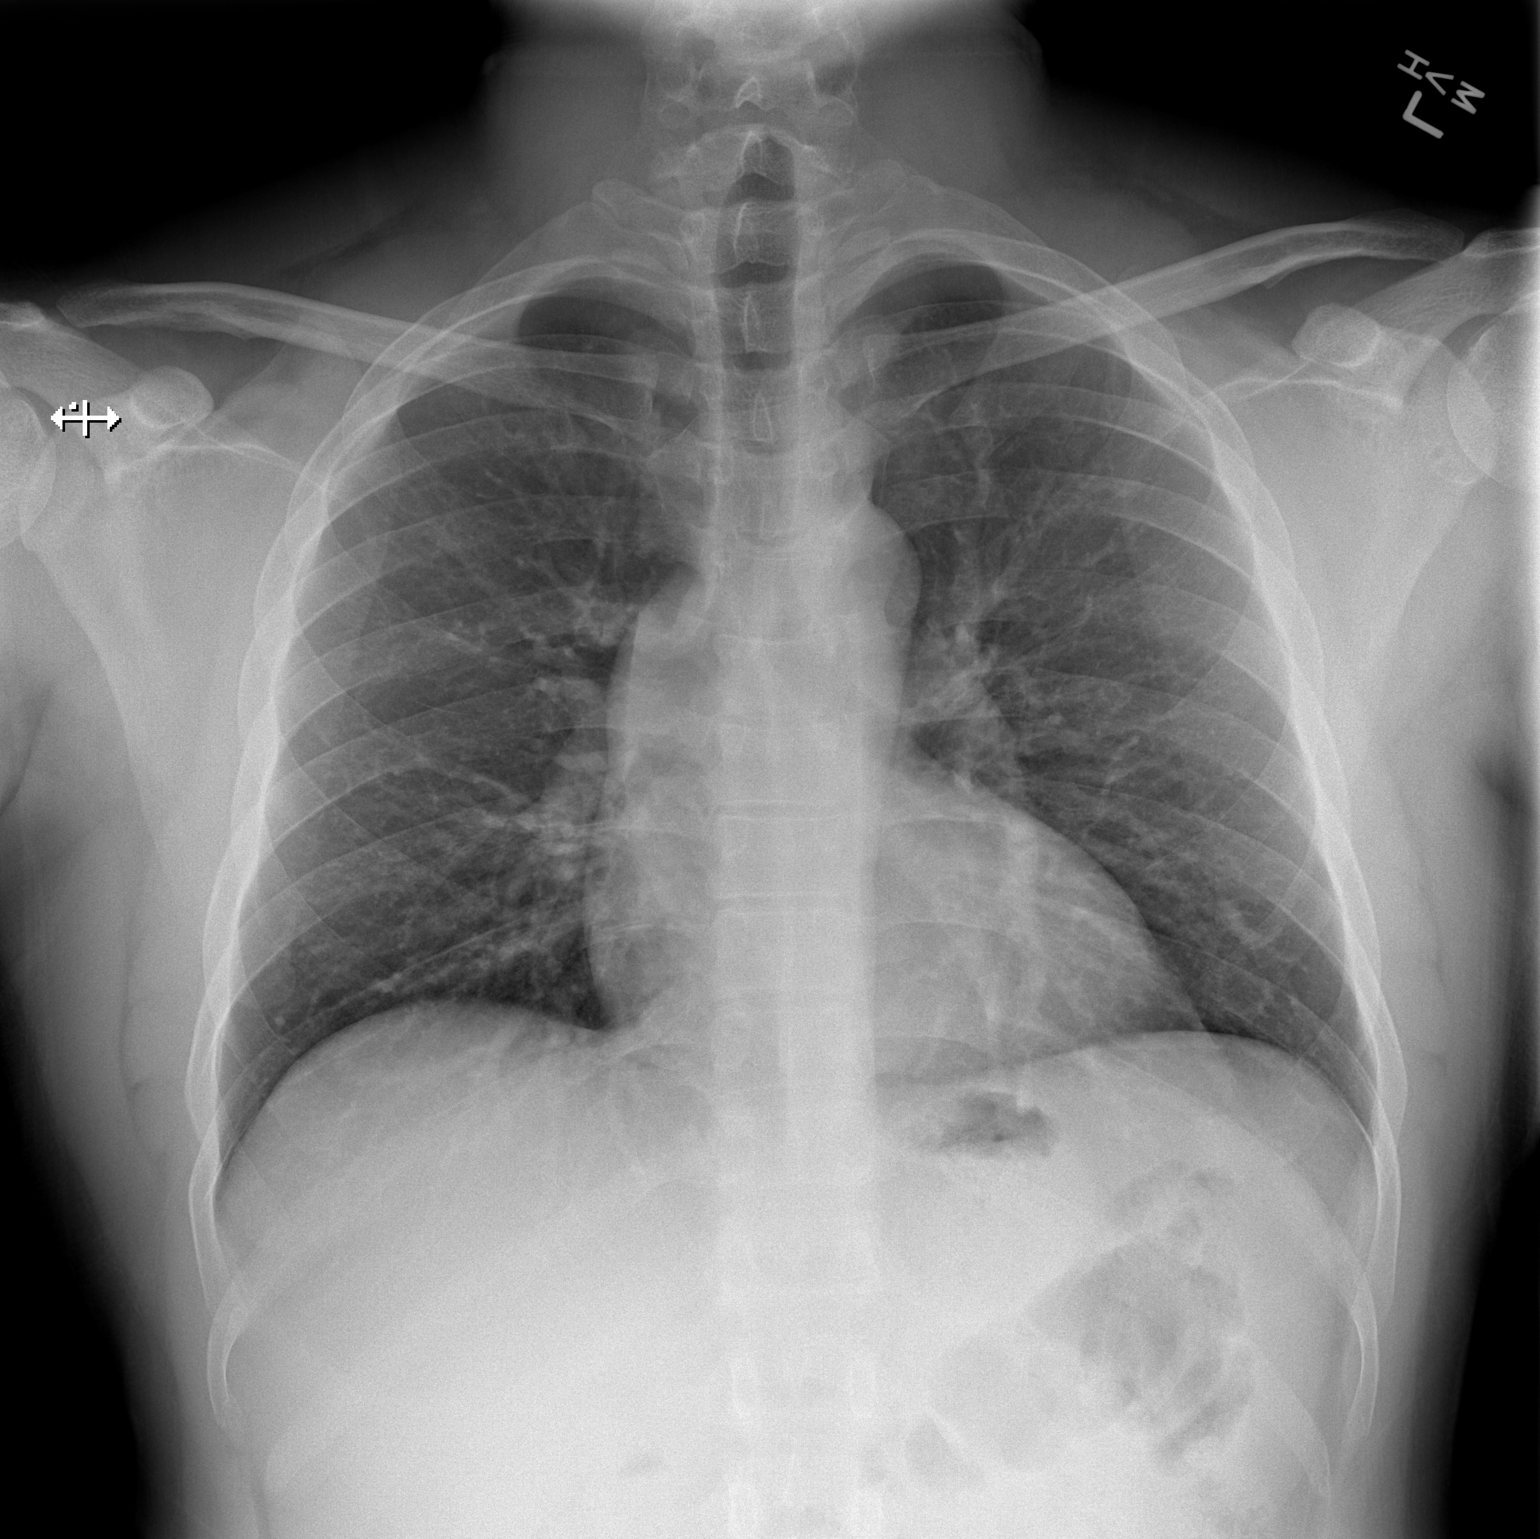

[w chest lat]
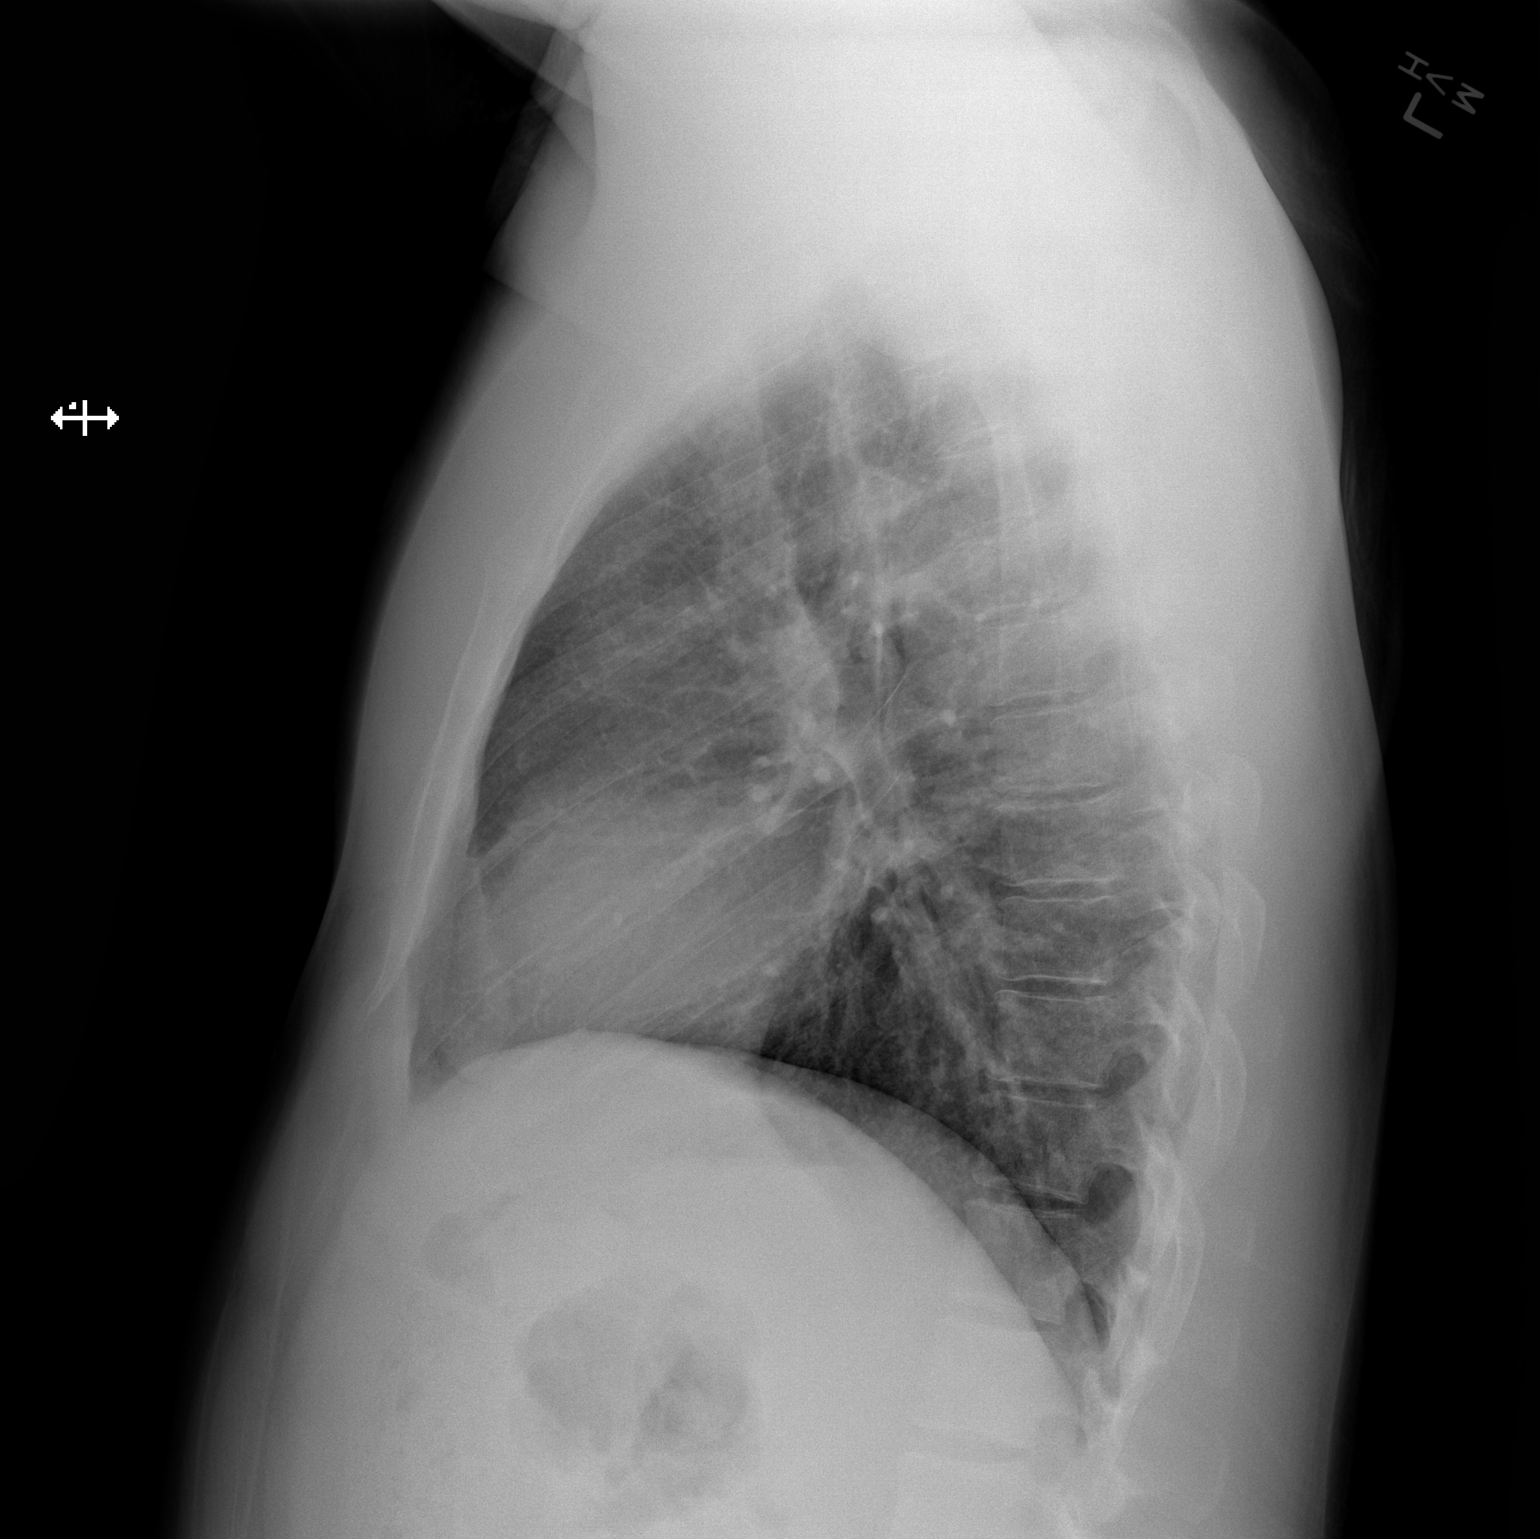

[2 of 2 positions shown; findings below may reference images not displayed]

FINDINGS: Cardiomediastinal silhouette projects within normal limits in size
and contour. No confluent airspace disease, pneumothorax, or pleural
effusion.

No displaced fracture.

Unremarkable appearance of the upper abdomen.
IMPRESSION: No radiographic evidence of acute cardiopulmonary disease.

## 2016-06-08 ENCOUNTER — Telehealth: Payer: Self-pay

## 2016-06-08 ENCOUNTER — Ambulatory Visit: Payer: BLUE CROSS/BLUE SHIELD | Admitting: Neurology

## 2016-06-08 NOTE — Telephone Encounter (Signed)
Patient did not show to appt today  

## 2016-06-09 ENCOUNTER — Encounter: Payer: Self-pay | Admitting: Neurology

## 2016-06-25 ENCOUNTER — Emergency Department (HOSPITAL_COMMUNITY): Payer: BLUE CROSS/BLUE SHIELD

## 2016-06-25 ENCOUNTER — Encounter (HOSPITAL_COMMUNITY): Payer: Self-pay | Admitting: Emergency Medicine

## 2016-06-25 ENCOUNTER — Emergency Department (HOSPITAL_COMMUNITY)
Admission: EM | Admit: 2016-06-25 | Discharge: 2016-06-25 | Disposition: A | Discharge status: Home / Self Care | Payer: BLUE CROSS/BLUE SHIELD | Attending: Emergency Medicine | Admitting: Emergency Medicine

## 2016-06-25 DIAGNOSIS — F17210 Nicotine dependence, cigarettes, uncomplicated: Secondary | ICD-10-CM | POA: Insufficient documentation

## 2016-06-25 DIAGNOSIS — Z79899 Other long term (current) drug therapy: Secondary | ICD-10-CM | POA: Insufficient documentation

## 2016-06-25 DIAGNOSIS — R0789 Other chest pain: Secondary | ICD-10-CM | POA: Insufficient documentation

## 2016-06-25 DIAGNOSIS — I10 Essential (primary) hypertension: Secondary | ICD-10-CM | POA: Insufficient documentation

## 2016-06-25 LAB — CBC
HEMATOCRIT: 41.1 % (ref 39.0–52.0)
HEMOGLOBIN: 14.2 g/dL (ref 13.0–17.0)
MCH: 30.6 pg (ref 26.0–34.0)
MCHC: 34.5 g/dL (ref 30.0–36.0)
MCV: 88.6 fL (ref 78.0–100.0)
Platelets: 183 10*3/uL (ref 150–400)
RBC: 4.64 MIL/uL (ref 4.22–5.81)
RDW: 13.4 % (ref 11.5–15.5)
WBC: 5.6 10*3/uL (ref 4.0–10.5)

## 2016-06-25 LAB — BASIC METABOLIC PANEL
Anion gap: 6 (ref 5–15)
BUN: 16 mg/dL (ref 6–20)
CALCIUM: 9.2 mg/dL (ref 8.9–10.3)
CO2: 26 mmol/L (ref 22–32)
Chloride: 107 mmol/L (ref 101–111)
Creatinine, Ser: 0.97 mg/dL (ref 0.61–1.24)
GFR calc Af Amer: >60 mL/min (ref >60)
GFR calc non Af Amer: >60 mL/min (ref >60)
Glucose, Bld: 99 mg/dL (ref 65–99)
Potassium: 3.9 mmol/L (ref 3.5–5.1)
Sodium: 139 mmol/L (ref 135–145)

## 2016-06-25 LAB — I-STAT TROPONIN, ED: TROPONIN I, POC: 0.02 ng/mL (ref 0.00–0.08)

## 2016-06-25 NOTE — ED Provider Notes (Signed)
[facility]-EMERGENCY DEPT Provider Note   CSN: [id number] Arrival date & time: 06/25/16  [id number]  First Provider Contact:  First MD Initiated Contact with Patient 06/25/16 0930        History   Chief Complaint Chief Complaint  Patient presents with  . Chest Pain    HPI Eloise Levelslex T Andreen is a 35 y.o. male.  The history is provided by the patient.  Chest Pain   This is a new problem. The problem occurs constantly. The problem has not changed since onset.The pain is associated with movement. The pain is present in the lateral region. The quality of the pain is described as sharp (started as dull pain in back). The pain radiates to the left arm. Duration of episode(s) is 2 days. The symptoms are aggravated by certain positions. Pertinent negatives include no abdominal pain, no cough, no fever and no lower extremity edema. Treatments tried: aleve. The treatment provided no relief. Risk factors include male gender, smoking/tobacco exposure and alcohol intake.  Pertinent negatives for past medical history include no CAD.  His family medical history is significant for hypertension.    Past Medical History:  Diagnosis Date  . Alcoholic fatty liver   . Hypertension   . Migraines     Patient Active Problem List   Diagnosis Date Noted  . Elevated CPK 10/22/2015  . Fasciculations 10/20/2015  . Dizziness and giddiness 09/03/2014  . Numbness and tingling 09/03/2014  . Blurry vision 07/31/2014  . Elevated BP 07/31/2014  . Scapular dyskinesis 07/11/2014    Past Surgical History:  Procedure Laterality Date  . NO PAST SURGERIES         Home Medications    Prior to Admission medications   Medication Sig Start Date End Date Taking? Authorizing Provider  atorvastatin (LIPITOR) 10 MG tablet Take 10 mg by mouth every evening. 06/07/16  Yes Historical Provider, MD  losartan (COZAAR) 50 MG tablet Take 50 mg by mouth daily. 06/05/16  Yes Historical Provider, MD  valACYclovir (VALTREX) 1000 MG tablet  Take 1 g by mouth daily. 05/21/16  Yes Historical Provider, MD    Family History Family History  Problem Relation Age of Onset  . Diabetes Mother   . Hypertension Mother   . Cancer Father     liver  . Hypertension Father   . Cancer Paternal Uncle     coon cancer     Social History Social History  Substance Use Topics  . Smoking status: Current Every Day Smoker    Packs/day: 0.30    Years: 14.00    Types: Cigarettes  . Smokeless tobacco: Never Used  . Alcohol use 0.0 oz/week     Comment: social     Allergies   Review of patient's allergies indicates no known allergies.   Review of Systems Review of Systems  Constitutional: Negative for fever.  Respiratory: Negative for cough.   Cardiovascular: Positive for chest pain.  Gastrointestinal: Negative for abdominal pain.  All other systems reviewed and are negative.    Physical Exam Updated Vital Signs BP 126/96 (BP Location: Right Arm)   Pulse 77   Temp 97.8 F (36.6 C) (Oral)   Resp 16   Ht 5\' 11"  (1.803 m)   Wt 213 lb (96.6 kg)   SpO2 100%   BMI 29.71 kg/m   Physical Exam  Constitutional: He is oriented to person, place, and time. He appears well-developed and well-nourished. No distress.  HENT:  Head: Normocephalic and atraumatic.  Eyes: Conjunctivae  are normal.  Neck: Neck supple. No tracheal deviation present.  Cardiovascular: Normal rate, regular rhythm and normal heart sounds.   Pulmonary/Chest: Effort normal and breath sounds normal. No respiratory distress. He has no wheezes. He has no rales. He exhibits no tenderness.  Abdominal: Soft. He exhibits no distension.  Neurological: He is alert and oriented to person, place, and time.  Skin: Skin is warm and dry.  Psychiatric: He has a normal mood and affect.  Vitals reviewed.    ED Treatments / Results  Labs (all labs ordered are listed, but only abnormal results are displayed) Labs Reviewed  BASIC METABOLIC PANEL  CBC  I-STAT TROPOININ, ED     EKG  EKG Interpretation  Date/Time:  Friday June 25 2016 08:58:02 EDT Ventricular Rate:  69 PR Interval:    QRS Duration: 82 QT Interval:  360 QTC Calculation: 386 R Axis:   38 Text Interpretation:  Sinus rhythm ST elev, probable normal early repol pattern No significant change since last tracing Confirmed by Brae Gartman MD, Reuel Boom [phone]) on 06/25/2016 9:30:57 AM       Radiology Dg Chest 2 View  Result Date: 06/25/2016 CLINICAL DATA:  New mid chest discomfort since yesterday, fluttering sensation in the chest, nonsmoker, history of hypertension. EXAM: CHEST  2 VIEW COMPARISON:  PA and lateral chest x-ray of December 04, 2014 FINDINGS: The lungs are adequately inflated and clear. The heart and pulmonary vascularity are normal. The mediastinum is normal in width. There is no pleural effusion. The bony thorax exhibits no acute abnormality. IMPRESSION: There is no active cardiopulmonary disease. Electronically Signed   By: David  [place] M.D.   On: 06/25/2016 09:20    Procedures Procedures (including critical care time)  Medications Ordered in ED Medications - No data to display   Initial Impression / Assessment and Plan / ED Course  I have reviewed the triage vital signs and the nursing notes.  Pertinent labs & imaging results that were available during my care of the patient were reviewed by me and considered in my medical decision making (see chart for details).  Clinical Course   35 y.o. male presents with chest pain that is highly atypical and has been ongoing for last 2 days. Negative troponin and heart score is low risk, no risk factors for PE. 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 Patient was recommended to take short course of scheduled NSAIDs and engage in early mobility as definitive treatment.   Final Clinical Impressions(s) / ED Diagnoses   Final diagnoses:  Chest wall pain    New Prescriptions Discharge Medication List as of 06/25/2016 10:36  AM       Lyndal Pulley, MD 06/25/16 [phone]

## 2016-06-25 NOTE — ED Triage Notes (Addendum)
Pt complaint of intermittent left chest pain radiating left arm/left back; described as tightness onset 2 days ago.

## 2016-08-06 ENCOUNTER — Ambulatory Visit (HOSPITAL_COMMUNITY)
Admission: EM | Admit: 2016-08-06 | Discharge: 2016-08-06 | Disposition: A | Discharge status: Home / Self Care | Payer: BLUE CROSS/BLUE SHIELD | Attending: Family Medicine | Admitting: Family Medicine

## 2016-08-06 ENCOUNTER — Encounter (HOSPITAL_COMMUNITY): Payer: Self-pay | Admitting: Emergency Medicine

## 2016-08-06 DIAGNOSIS — H109 Unspecified conjunctivitis: Secondary | ICD-10-CM

## 2016-08-06 MED ORDER — TOBRAMYCIN 0.3 % OP SOLN: 1.0000 [drp] | Freq: Four times a day (QID) | OPHTHALMIC | 0 refills | Status: DC

## 2016-08-06 NOTE — ED Triage Notes (Signed)
Pt is here for left eye pain onset 3 days associated w/redness, swelling around eye, and watery  Denies inj/trauma  A&O x4... NAD

## 2016-08-06 NOTE — ED Provider Notes (Signed)
[facility]    CSN: [id number] Arrival date & time: 08/06/16  1332  First Provider Contact:  First MD Initiated Contact with Patient 08/06/16 1420        History   Chief Complaint Chief Complaint  Patient presents with  . Eye Problem    HPI Douglas Wong is a 35 y.o. male.   This is a 35 year old man works in [employer] who has a left eye irritation for 3 days. Does not recall any foreign body getting in, but the eyes continue to fill globally irritated without pain. He has had no lid swelling or discharge.  Vision is unaffected.  Right eye is unaffected.      Past Medical History:  Diagnosis Date  . Alcoholic fatty liver   . Hypertension   . Migraines     Patient Active Problem List   Diagnosis Date Noted  . Elevated CPK 10/22/2015  . Fasciculations 10/20/2015  . Dizziness and giddiness 09/03/2014  . Numbness and tingling 09/03/2014  . Blurry vision 07/31/2014  . Elevated BP 07/31/2014  . Scapular dyskinesis 07/11/2014    Past Surgical History:  Procedure Laterality Date  . NO PAST SURGERIES         Home Medications    Prior to Admission medications   Medication Sig Start Date End Date Taking? Authorizing Provider  atorvastatin (LIPITOR) 10 MG tablet Take 10 mg by mouth every evening. 06/07/16   Historical Provider, MD  losartan (COZAAR) 50 MG tablet Take 50 mg by mouth daily. 06/05/16   Historical Provider, MD  tobramycin (TOBREX) 0.3 % ophthalmic solution Place 1 drop into the left eye every 6 (six) hours. 08/06/16   Elvina SidleKurt Nikea Settle, MD  valACYclovir (VALTREX) 1000 MG tablet Take 1 g by mouth daily. 05/21/16   Historical Provider, MD    Family History Family History  Problem Relation Age of Onset  . Diabetes Mother   . Hypertension Mother   . Cancer Father     liver  . Hypertension Father   . Cancer Paternal Uncle     coon cancer     Social History Social History  Substance Use Topics  . Smoking status: Current Every Day  Smoker    Packs/day: 0.30    Years: 14.00    Types: Cigarettes  . Smokeless tobacco: Never Used  . Alcohol use 0.0 oz/week     Comment: social     Allergies   Review of patient's allergies indicates no known allergies.   Review of Systems Review of Systems  Constitutional: Negative.   HENT: Negative.   Eyes: Positive for redness and itching. Negative for photophobia, pain, discharge and visual disturbance.     Physical Exam Triage Vital Signs ED Triage Vitals  Enc Vitals Group     BP 08/06/16 1346 122/72     Pulse Rate 08/06/16 1346 71     Resp 08/06/16 1346 16     Temp 08/06/16 1346 99 F (37.2 C)     Temp Source 08/06/16 1346 Oral     SpO2 08/06/16 1346 100 %     Weight --      Height --      Head Circumference --      Peak Flow --      Pain Score 08/06/16 1418 3     Pain Loc --      Pain Edu? --      Excl. in GC? --    No data found.  Updated Vital Signs BP 122/72 (BP Location: Left Arm)   Pulse 71   Temp 99 F (37.2 C) (Oral)   Resp 16   SpO2 100%   Visual Acuity Right Eye Distance: 20/25 Left Eye Distance: 20/50 Bilateral Distance: 20/25      Physical Exam  Constitutional: He is oriented to person, place, and time. He appears well-developed and well-nourished.  HENT:  Head: Normocephalic.  Right Ear: External ear normal.  Left Ear: External ear normal.  Mouth/Throat: Oropharynx is clear and moist.  Eyes: Pupils are equal, round, and reactive to light. Right eye exhibits no discharge. Left eye exhibits no discharge. No scleral icterus.  Left eye is injected diffusely. Lid eversion shows no foreign body. Fundi are normal  Neck: Normal range of motion. Neck supple.  Pulmonary/Chest: Effort normal.  Musculoskeletal: Normal range of motion.  Neurological: He is alert and oriented to person, place, and time.  Nursing note and vitals reviewed.    UC Treatments / Results  Labs (all labs ordered are listed, but only abnormal results are  displayed) Labs Reviewed - No data to display  EKG  EKG Interpretation None       Radiology No results found.  Procedures Procedures (including critical care time)  Medications Ordered in UC Medications - No data to display   Initial Impression / Assessment and Plan / UC Course  I have reviewed the triage vital signs and the nursing notes.  Pertinent labs & imaging results that were available during my care of the patient were reviewed by me and considered in my medical decision making (see chart for details).  Clinical Course      Final Clinical Impressions(s) / UC Diagnoses   Final diagnoses:  Conjunctivitis of left eye    New Prescriptions New Prescriptions   TOBRAMYCIN (TOBREX) 0.3 % OPHTHALMIC SOLUTION    Place 1 drop into the left eye every 6 (six) hours.     Elvina Sidle, MD 08/06/16 [phone]

## 2016-08-30 NOTE — Discharge Instructions (Signed)
[facility] °[facility] °ENDOSCOPIC SINUS SURGERY °[facility] ° °What is Functional Endoscopic Sinus Surgery? ° The Surgery involves making the natural openings of the sinuses larger by removing the bony partitions that separate the sinuses from the nasal cavity.  The natural sinus lining is preserved as much as possible to allow the sinuses to resume normal function after the surgery.  In some patients nasal polyps (excessively swollen lining of the sinuses) may be removed to relieve obstruction of the sinus openings.  The surgery is performed through the nose using lighted scopes, which eliminates the need for incisions on the face.  A septoplasty is a different procedure which is sometimes performed with sinus surgery.  It involves straightening the boy partition that separates the two sides of your nose.  A crooked or deviated septum may need repair if is obstructing the sinuses or nasal airflow.  Turbinate reduction is also often performed during sinus surgery.  The turbinates are bony proturberances from the side walls of the nose which swell and can obstruct the nose in patients with sinus and allergy problems.  Their size can be surgically reduced to help relieve nasal obstruction. ° °What Can Sinus Surgery Do For Me? ° Sinus surgery can reduce the frequency of sinus infections requiring antibiotic treatment.  This can provide improvement in nasal congestion, post-nasal drainage, facial pressure and nasal obstruction.  Surgery will NOT prevent you from ever having an infection again, so it usually only for patients who get infections 4 or more times yearly requiring antibiotics, or for infections that do not clear with antibiotics.  It will not cure nasal allergies, so patients with allergies may still require medication to treat their allergies after surgery. Surgery may improve headaches related to sinusitis, however, some people will continue to  require medication to control sinus headaches related to allergies.  Surgery will do nothing for other forms of headache (migraine, tension or cluster). ° °What Are the Risks of Endoscopic Sinus Surgery? ° Current techniques allow surgery to be performed safely with little risk, however, there are rare complications that patients should be aware of.  Because the sinuses are located around the eyes, there is risk of eye injury, including blindness, though again, this would be quite rare. This is usually a result of bleeding behind the eye during surgery, which puts the vision oat risk, though there are treatments to protect the vision and prevent permanent disrupted by surgery causing a leak of the spinal fluid that surrounds the brain.  More serious complications would include bleeding inside the brain cavity or damage to the brain.  Again, all of these complications are uncommon, and spinal fluid leaks can be safely managed surgically if they occur.  The most common complication of sinus surgery is bleeding from the nose, which may require packing or cauterization of the nose.  Continued sinus have polyps may experience recurrence of the polyps requiring revision surgery.  Alterations of sense of smell or injury to the tear ducts are also rare complications.  ° °What is the Surgery Like, and what is the Recovery? ° The Surgery usually takes a couple of hours to perform, and is usually performed under a general anesthetic (completely asleep).  Patients are usually discharged home after a couple of hours.  Sometimes during surgery it is necessary to pack the nose to control bleeding, and the packing is left in place for 24 - 48 hours, and removed by your surgeon.    If a septoplasty was performed during the procedure, there is often a splint placed which must be removed after 5-7 days.   °Discomfort: Pain is usually mild to moderate, and can be controlled by prescription pain medication or acetaminophen (Tylenol).   Aspirin, Ibuprofen (Advil, Motrin), or Naprosyn (Aleve) should be avoided, as they can cause increased bleeding.  Most patients feel sinus pressure like they have a bad head cold for several days.  Sleeping with your head elevated can help reduce swelling and facial pressure, as can ice packs over the face.  A humidifier may be helpful to keep the mucous and blood from drying in the nose.  ° °Diet: There are no specific diet restrictions, however, you should generally start with clear liquids and a light diet of bland foods because the anesthetic can cause some nausea.  Advance your diet depending on how your stomach feels.  Taking your pain medication with food will often help reduce stomach upset which pain medications can cause. ° °Nasal Saline Irrigation: It is important to remove blood clots and dried mucous from the nose as it is healing.  This is done by having you irrigate the nose at least 3 - 4 times daily with a salt water solution.  We recommend using NeilMed Sinus Rinse (available at the drug store).  Fill the squeeze bottle with the solution, bend over a sink, and insert the tip of the squeeze bottle into the nose ½ of an inch.  Point the tip of the squeeze bottle towards the inside corner of the eye on the same side your irrigating.  Squeeze the bottle and gently irrigate the nose.  If you bend forward as you do this, most of the fluid will flow back out of the nose, instead of down your throat.   The solution should be warm, near body temperature, when you irrigate.   Each time you irrigate, you should use a full squeeze bottle.  ° °Note that if you are instructed to use Nasal Steroid Sprays at any time after your surgery, irrigate with saline BEFORE using the steroid spray, so you do not wash it all out of the nose. °Another product, Nasal Saline Gel (such as AYR Nasal Saline Gel) can be applied in each nostril 3 - 4 times daily to moisture the nose and reduce scabbing or crusting. ° °Bleeding:   Bloody drainage from the nose can be expected for several days, and patients are instructed to irrigate their nose frequently with salt water to help remove mucous and blood clots.  The drainage may be dark red or brown, though some fresh blood may be seen intermittently, especially after irrigation.  Do not blow you nose, as bleeding may occur. If you must sneeze, keep your mouth open to allow air to escape through your mouth. ° °If heavy bleeding occurs: Irrigate the nose with saline to rinse out clots, then spray the nose 3 - 4 times with Afrin Nasal Decongestant Spray.  The spray will constrict the blood vessels to slow bleeding.  Pinch the lower half of your nose shut to apply pressure, and lay down with your head elevated.  Ice packs over the nose may help as well. If bleeding persists despite these measures, you should notify your doctor.  Do not use the Afrin routinely to control nasal congestion after surgery, as it can result in worsening congestion and may affect healing.  ° ° ° °Activity: Return to work varies among patients. Most patients will be   out of work at least 5 - 7 days to recover.  Patient may return to work after they are off of narcotic pain medication, and feeling well enough to perform the functions of their job.  Patients must avoid heavy lifting (over 10 pounds) or strenuous physical for 2 weeks after surgery, so your employer may need to assign you to light duty, or keep you out of work longer if light duty is not possible.  NOTE: you should not drive, operate dangerous machinery, do any mentally demanding tasks or make any important legal or financial decisions while on narcotic pain medication and recovering from the general anesthetic.  °  °Call Your Doctor Immediately if You Have Any of the Following: °1. Bleeding that you cannot control with the above measures °2. Loss of vision, double vision, bulging of the eye or black eyes. °3. Fever over 101 degrees °4. Neck stiffness with  severe headache, fever, nausea and change in mental state. °You are always encourage to call anytime with concerns, however, please call with requests for pain medication refills during office hours. ° °Office Endoscopy: During follow-up visits your doctor will remove any packing or splints that may have been placed and evaluate and clean your sinuses endoscopically.  Topical anesthetic will be used to make this as comfortable as possible, though you may want to take your pain medication prior to the visit.  How often this will need to be done varies from patient to patient.  After complete recovery from the surgery, you may need follow-up endoscopy from time to time, particularly if there is concern of recurrent infection or nasal polyps. ° °General Anesthesia, Adult, Care After °Refer to this sheet in the next few weeks. These instructions provide you with information on caring for yourself after your procedure. Your health care provider may also give you more specific instructions. Your treatment has been planned according to current medical practices, but problems sometimes occur. Call your health care provider if you have any problems or questions after your procedure. °WHAT TO EXPECT AFTER THE PROCEDURE °After the procedure, it is typical to experience: °· Sleepiness. °· Nausea and vomiting. °HOME CARE INSTRUCTIONS °· For the first 24 hours after general anesthesia: °¨ Have a responsible person with you. °¨ Do not drive a car. If you are alone, do not take public transportation. °¨ Do not drink alcohol. °¨ Do not take medicine that has not been prescribed by your health care provider. °¨ Do not sign important papers or make important decisions. °¨ You may resume a normal diet and activities as directed by your health care provider. °· Change bandages (dressings) as directed. °· If you have questions or problems that seem related to general anesthesia, call the hospital and ask for the anesthetist or  anesthesiologist on call. °SEEK MEDICAL CARE IF: °· You have nausea and vomiting that continue the day after anesthesia. °· You develop a rash. °SEEK IMMEDIATE MEDICAL CARE IF:  °· You have difficulty breathing. °· You have chest pain. °· You have any allergic problems. °  °This information is not intended to replace advice given to you by your health care provider. Make sure you discuss any questions you have with your health care provider. °  °Document Released: 02/07/2001 Document Revised: 11/22/2014 Document Reviewed: 03/01/2012 °Elsevier Interactive Patient Education ©2016 [employer] ° °

## 2016-09-02 ENCOUNTER — Ambulatory Visit: Payer: BLUE CROSS/BLUE SHIELD | Admitting: Anesthesiology

## 2016-09-02 ENCOUNTER — Encounter
Admission: RE | Disposition: A | Discharge status: Home / Self Care | Payer: Self-pay | Source: Ambulatory Visit | Attending: Otolaryngology

## 2016-09-02 ENCOUNTER — Ambulatory Visit
Admission: RE | Admit: 2016-09-02 | Discharge: 2016-09-02 | Disposition: A | Discharge status: Home / Self Care | Payer: BLUE CROSS/BLUE SHIELD | Source: Ambulatory Visit | Attending: Otolaryngology | Admitting: Otolaryngology

## 2016-09-02 DIAGNOSIS — Z79899 Other long term (current) drug therapy: Secondary | ICD-10-CM | POA: Insufficient documentation

## 2016-09-02 DIAGNOSIS — E7800 Pure hypercholesterolemia, unspecified: Secondary | ICD-10-CM | POA: Diagnosis not present

## 2016-09-02 DIAGNOSIS — J342 Deviated nasal septum: Secondary | ICD-10-CM | POA: Diagnosis not present

## 2016-09-02 DIAGNOSIS — E785 Hyperlipidemia, unspecified: Secondary | ICD-10-CM | POA: Diagnosis not present

## 2016-09-02 DIAGNOSIS — J343 Hypertrophy of nasal turbinates: Secondary | ICD-10-CM | POA: Insufficient documentation

## 2016-09-02 HISTORY — DX: Dizziness and giddiness: R42

## 2016-09-02 HISTORY — DX: Hypertrophy of nasal turbinates: J34.3

## 2016-09-02 HISTORY — DX: Gastro-esophageal reflux disease without esophagitis: K21.9

## 2016-09-02 HISTORY — PX: SEPTOPLASTY: SHX2393

## 2016-09-02 HISTORY — DX: Tinnitus, unspecified ear: H93.19

## 2016-09-02 HISTORY — PX: TURBINATE REDUCTION: SHX6157

## 2016-09-02 HISTORY — DX: Hyperlipidemia, unspecified: E78.5

## 2016-09-02 HISTORY — DX: Deviated nasal septum: J34.2

## 2016-09-02 HISTORY — DX: Myoneural disorder, unspecified: G70.9

## 2016-09-02 SURGERY — SEPTOPLASTY, NOSE
Anesthesia: General | Laterality: Bilateral | Wound class: Clean Contaminated

## 2016-09-02 MED ORDER — ROCURONIUM BROMIDE 100 MG/10ML IV SOLN
INTRAVENOUS | Status: DC | PRN
  Administered 2016-09-02: 25 mg via INTRAVENOUS

## 2016-09-02 MED ORDER — HYDROMORPHONE HCL 1 MG/ML IJ SOLN: 0.2500 mg | INTRAMUSCULAR | Status: DC | PRN

## 2016-09-02 MED ORDER — PHENYLEPHRINE HCL 0.5 % NA SOLN
NASAL | Status: DC | PRN
  Administered 2016-09-02: 15 mL via TOPICAL

## 2016-09-02 MED ORDER — ONDANSETRON HCL 4 MG/2ML IJ SOLN
INTRAMUSCULAR | Status: DC | PRN
  Administered 2016-09-02: 4 mg via INTRAVENOUS

## 2016-09-02 MED ORDER — OXYMETAZOLINE HCL 0.05 % NA SOLN
2.0000 | Freq: Once | NASAL | Status: AC
  Administered 2016-09-02: 2 via NASAL

## 2016-09-02 MED ORDER — ONDANSETRON HCL 4 MG/2ML IJ SOLN: 4.0000 mg | Freq: Once | INTRAMUSCULAR | Status: DC | PRN

## 2016-09-02 MED ORDER — DEXAMETHASONE SODIUM PHOSPHATE 4 MG/ML IJ SOLN
INTRAMUSCULAR | Status: DC | PRN
  Administered 2016-09-02: 10 mg via INTRAVENOUS

## 2016-09-02 MED ORDER — OXYCODONE HCL 5 MG PO TABS
5.0000 mg | ORAL_TABLET | Freq: Once | ORAL | Status: AC | PRN
  Administered 2016-09-02: 5 mg via ORAL

## 2016-09-02 MED ORDER — MIDAZOLAM HCL 5 MG/5ML IJ SOLN
INTRAMUSCULAR | Status: DC | PRN
  Administered 2016-09-02: 2 mg via INTRAVENOUS

## 2016-09-02 MED ORDER — DEXTROSE 5 % IV SOLN
2000.0000 mg | Freq: Once | INTRAVENOUS | Status: AC
  Administered 2016-09-02: 2000 mg via INTRAVENOUS

## 2016-09-02 MED ORDER — OXYCODONE HCL 5 MG/5ML PO SOLN: 5.0000 mg | Freq: Once | ORAL | Status: AC | PRN

## 2016-09-02 MED ORDER — ACETAMINOPHEN 325 MG PO TABS: 325.0000 mg | ORAL_TABLET | ORAL | Status: DC | PRN

## 2016-09-02 MED ORDER — LIDOCAINE-EPINEPHRINE 1 %-1:100000 IJ SOLN
INTRAMUSCULAR | Status: DC | PRN
  Administered 2016-09-02: 6 mL

## 2016-09-02 MED ORDER — FENTANYL CITRATE (PF) 100 MCG/2ML IJ SOLN
INTRAMUSCULAR | Status: DC | PRN
  Administered 2016-09-02: 100 ug via INTRAVENOUS

## 2016-09-02 MED ORDER — GLYCOPYRROLATE 0.2 MG/ML IJ SOLN
INTRAMUSCULAR | Status: DC | PRN
Start: 2016-09-02 — End: 2016-09-02
  Administered 2016-09-02: .1 mg via INTRAVENOUS

## 2016-09-02 MED ORDER — ACETAMINOPHEN 160 MG/5ML PO SOLN: 325.0000 mg | ORAL | Status: DC | PRN

## 2016-09-02 MED ORDER — ACETAMINOPHEN 10 MG/ML IV SOLN
1000.0000 mg | Freq: Once | INTRAVENOUS | Status: AC
  Administered 2016-09-02: 1000 mg via INTRAVENOUS

## 2016-09-02 MED ORDER — PROPOFOL 10 MG/ML IV BOLUS
INTRAVENOUS | Status: DC | PRN
  Administered 2016-09-02: 200 mg via INTRAVENOUS

## 2016-09-02 MED ORDER — LACTATED RINGERS IV SOLN
INTRAVENOUS | Status: DC
  Administered 2016-09-02: 08:00:00 via INTRAVENOUS

## 2016-09-02 MED ORDER — LIDOCAINE HCL (CARDIAC) 20 MG/ML IV SOLN
INTRAVENOUS | Status: DC | PRN
  Administered 2016-09-02: 40 mg via INTRAVENOUS

## 2016-09-02 SURGICAL SUPPLY — 21 items
CANISTER SUCT 1200ML W/VALVE (MISCELLANEOUS) ×3 IMPLANT
COAGULATOR SUCT 8FR VV (MISCELLANEOUS) ×3 IMPLANT
DRAPE HEAD BAR (DRAPES) ×3 IMPLANT
GLOVE PI ULTRA LF STRL 7.5 (GLOVE) ×2 IMPLANT
GLOVE PI ULTRA NON LATEX 7.5 (GLOVE) ×4
KIT ROOM TURNOVER OR (KITS) ×3 IMPLANT
NEEDLE ANESTHESIA  27G X 3.5 (NEEDLE) ×2
NEEDLE ANESTHESIA 27G X 3.5 (NEEDLE) ×1 IMPLANT
NS IRRIG 500ML POUR BTL (IV SOLUTION) ×3 IMPLANT
PACK DRAPE NASAL/ENT (PACKS) ×3 IMPLANT
PAD GROUND ADULT SPLIT (MISCELLANEOUS) ×3 IMPLANT
PATTIES SURGICAL .5 X3 (DISPOSABLE) ×3 IMPLANT
SPLINT NASAL SEPTAL BLV .50 ST (MISCELLANEOUS) ×3 IMPLANT
STRAP BODY AND KNEE 60X3 (MISCELLANEOUS) ×3 IMPLANT
SUT CHROMIC 3-0 (SUTURE)
SUT CHROMIC 3-0 KS 27XMFL CR (SUTURE)
SUT ETHILON 3-0 KS 30 BLK (SUTURE) ×3 IMPLANT
SUT PLAIN GUT 4-0 (SUTURE) ×3 IMPLANT
SUTURE CHRMC 3-0 KS 27XMFL CR (SUTURE) IMPLANT
SYR 3ML LL SCALE MARK (SYRINGE) ×3 IMPLANT
TOWEL OR 17X26 4PK STRL BLUE (TOWEL DISPOSABLE) ×3 IMPLANT

## 2016-09-02 NOTE — Op Note (Signed)
09/02/2016  10:15 AM  [id number]   Pre-Op Dx:  Deviated Nasal Septum, Hypertrophic Inferior Turbinates  Post-op Dx: Same  Proc: Nasal Septoplasty, Partial Reduction right Inferior Turbinate  Surg:  Huie Ghuman H  Anes:  GOT  EBL:  30 mL  Comp:  None  Findings: Patient had a bony spur of the ethmoid plate that was pushing into the right middle turbinate. The rest the septum had some deviation at the vomer but the quadrangular plate was relatively straight  Procedure: With the patient in a comfortable supine position,  general orotracheal anesthesia was induced without difficulty.     The patient received preoperative Afrin spray for topical decongestion and vasoconstriction.  Intravenous prophylactic antibiotics were administered.  At an appropriate level, the patient was placed in a semi-sitting position.  Nasal vibrissae were trimmed.   1% Xylocaine with 1:100,000 epinephrine, 6 cc's, was infiltrated into the anterior floor of the nose, into the nasal spine region, into the membranous columella, and finally into the submucoperichondrial plane of the septum on both sides.  Several minutes were allowed for this to take effect.  Cottoniod pledgetts soaked in Afrin and 4% Xylocaine were placed into both nasal cavities and left while the patient was prepped and draped in the standard fashion.  The materials were removed from the nose and observed to be intact and correct in number.  The nose was inspected with a headlight and a 0 scope with the findings as described above.  A left Killian incision was sharply executed and carried down to the caudal edge of the quadrangular cartilage. The mucoperichondrium was elelvated along the quadrangular plate back to the bony-cartilaginous junction. The mucoperiostium was then elevated along the ethmoid plate and the vomer. The boney-catilaginous junction was then split with a freer elevator and the mucoperiosteum was elevated on the opposite side.  The mucoperiosteum was then elevated along the maxillary crest as needed to expose the crooked bone of the crest.  Boney spurs of the vomer and maxillary crest were removed with Lenoria Chimeakahashi forceps.  The cartilaginous plate was trimmed along its posterior and inferior borders of about 2 mm of cartilage to free it up inferiorly. Some of the deviated ethmoid plate was then fractured and removed with Takahashi forceps to free up the posterior border of the quadrangular plate and allow it to swing back to the midline. The large bony spur of the inferior ethmoid plate was fractured and removed with no longer impinged on the middle turbinate. The mucosal flaps were placed back into their anatomic position to allow visualization of the airways. The septum now sat in the midline with an improved airway.  The mucosal flaps are then sutured together using a through and through whip stitch of 4-0 Plain Gut and a mini-Keith needle was used to close the AdrianKillian incision as well.   The inferior turbinates were then inspected. An incision was created along the inferior aspect of the right inferior turbinate with removal of some of the inferior soft tissue and bone. Electrocautery was used to control bleeding in the area. The remaining turbinate was then outfractured to open up the airway further. There was no significant bleeding noted. The right turbinate was then outfractured to create a little more space.  The airways were then visualized and showed open passageways on both sides that were significantly improved compared to before surgery. There was no signifcant bleeding. Nasal splints were applied to both sides of the septum using Xomed 0.325mm regular sized splints that  were trimmed, and then held in position with a 3-0 Nylon through and through suture.  The patient was turned back over to anesthesia, and awakened, extubated, and taken to the PACU in satisfactory condition.  Dispo:   PACU to home  Plan: Ice,  elevation, narcotic analgesia, steroid taper, and prophylactic antibiotics for the duration of indwelling nasal foreign bodies.  We will reevaluate the patient in the office in 6 days and remove the septal splints.  Return to work in 10 days, strenuous activities in two weeks.   Shjon Lizarraga H 09/02/2016 10:15 AM

## 2016-09-02 NOTE — Anesthesia Postprocedure Evaluation (Signed)
Anesthesia Post Note  Patient: Douglas Wong  Procedure(s) Performed: Procedure(s) (LRB): SEPTOPLASTY (Bilateral) TURBINATE REDUCTION (Bilateral)  Patient location during evaluation: PACU Anesthesia Type: General Level of consciousness: awake and alert Pain management: pain level controlled Vital Signs Assessment: post-procedure vital signs reviewed and stable Respiratory status: spontaneous breathing, nonlabored ventilation, respiratory function stable and patient connected to nasal cannula oxygen Cardiovascular status: blood pressure returned to baseline and stable Postop Assessment: no signs of nausea or vomiting Anesthetic complications: no    Alta CorningBacon, Seabron Iannello S

## 2016-09-02 NOTE — Anesthesia Preprocedure Evaluation (Signed)
Anesthesia Evaluation  Patient identified by MRN, date of birth, ID band Patient awake    Reviewed: Allergy & Precautions, H&P , NPO status , Patient's Chart, lab work & pertinent test results, reviewed documented beta blocker date and time   Airway Mallampati: II  TM Distance: >3 FB Neck ROM: full    Dental no notable dental hx. (+) Chipped   Pulmonary Current Smoker,    Pulmonary exam normal breath sounds clear to auscultation       Cardiovascular Exercise Tolerance: Good hypertension,  Rhythm:regular Rate:Normal     Neuro/Psych negative neurological ROS  negative psych ROS   GI/Hepatic GERD  ,Alcoholic fatty liver   Endo/Other  negative endocrine ROS  Renal/GU negative Renal ROS  negative genitourinary   Musculoskeletal   Abdominal   Peds  Hematology negative hematology ROS (+)   Anesthesia Other Findings   Reproductive/Obstetrics negative OB ROS                             Anesthesia Physical Anesthesia Plan  ASA: II  Anesthesia Plan: General   Post-op Pain Management:    Induction:   Airway Management Planned:   Additional Equipment:   Intra-op Plan:   Post-operative Plan:   Informed Consent: I have reviewed the patients History and Physical, chart, labs and discussed the procedure including the risks, benefits and alternatives for the proposed anesthesia with the patient or authorized representative who has indicated his/her understanding and acceptance.   Dental Advisory Given  Plan Discussed with: CRNA and Anesthesiologist  Anesthesia Plan Comments:         Anesthesia Quick Evaluation

## 2016-09-02 NOTE — Transfer of Care (Signed)
Immediate Anesthesia Transfer of Care Note  Patient: Douglas Wong  Procedure(s) Performed: Procedure(s): SEPTOPLASTY (Bilateral) TURBINATE REDUCTION (Bilateral)  Patient Location: PACU  Anesthesia Type: General  Level of Consciousness: awake, alert  and patient cooperative  Airway and Oxygen Therapy: Patient Spontanous Breathing and Patient connected to supplemental oxygen  Post-op Assessment: Post-op Vital signs reviewed, Patient's Cardiovascular Status Stable, Respiratory Function Stable, Patent Airway and No signs of Nausea or vomiting  Post-op Vital Signs: Reviewed and stable  Complications: No apparent anesthesia complications

## 2016-09-02 NOTE — H&P (Signed)
  H&P has been reviewed and no changes necessary. To be downloaded later. 

## 2016-09-02 NOTE — Anesthesia Procedure Notes (Signed)
Procedure Name: Intubation Date/Time: 09/02/2016 9:29 AM Performed by: Jimmy PicketAMYOT, Martise Waddell Pre-anesthesia Checklist: Patient identified, Emergency Drugs available, Suction available, Patient being monitored and Timeout performed Patient Re-evaluated:Patient Re-evaluated prior to inductionOxygen Delivery Method: Circle system utilized Preoxygenation: Pre-oxygenation with 100% oxygen Intubation Type: IV induction Ventilation: Mask ventilation without difficulty Laryngoscope Size: Miller and 3 Grade View: Grade I Tube type: Oral Rae Tube size: 7.5 mm Number of attempts: 1 Placement Confirmation: ETT inserted through vocal cords under direct vision,  positive ETCO2 and breath sounds checked- equal and bilateral Tube secured with: Tape Dental Injury: Teeth and Oropharynx as per pre-operative assessment

## 2016-09-03 ENCOUNTER — Encounter: Payer: Self-pay | Admitting: Otolaryngology

## 2017-02-15 ENCOUNTER — Ambulatory Visit: Payer: BLUE CROSS/BLUE SHIELD | Admitting: Neurology

## 2017-08-22 ENCOUNTER — Ambulatory Visit: Payer: BLUE CROSS/BLUE SHIELD

## 2017-08-25 ENCOUNTER — Ambulatory Visit (INDEPENDENT_AMBULATORY_CARE_PROVIDER_SITE_OTHER): Payer: Self-pay | Admitting: Internal Medicine

## 2017-08-25 ENCOUNTER — Encounter: Payer: Self-pay | Admitting: Internal Medicine

## 2017-08-25 VITALS — BP 145/91 | HR 70 | Temp 97.8°F | Ht 72.0 in | Wt 218.8 lb

## 2017-08-25 DIAGNOSIS — K700 Alcoholic fatty liver: Secondary | ICD-10-CM

## 2017-08-25 DIAGNOSIS — R253 Fasciculation: Secondary | ICD-10-CM

## 2017-08-25 DIAGNOSIS — I10 Essential (primary) hypertension: Secondary | ICD-10-CM

## 2017-08-25 DIAGNOSIS — F17210 Nicotine dependence, cigarettes, uncomplicated: Secondary | ICD-10-CM

## 2017-08-25 DIAGNOSIS — Z720 Tobacco use: Secondary | ICD-10-CM | POA: Insufficient documentation

## 2017-08-25 DIAGNOSIS — E785 Hyperlipidemia, unspecified: Secondary | ICD-10-CM

## 2017-08-25 MED ORDER — LOSARTAN POTASSIUM-HCTZ 50-12.5 MG PO TABS: 1.0000 | ORAL_TABLET | Freq: Every day | ORAL | 5 refills | Status: DC

## 2017-08-25 MED ORDER — BUPROPION HCL ER (SR) 150 MG PO TB12: 150.0000 mg | ORAL_TABLET | Freq: Two times a day (BID) | ORAL | 2 refills | Status: DC

## 2017-08-25 MED ORDER — BUPROPION HCL ER (SR) 150 MG PO TB12: 150.0000 mg | ORAL_TABLET | Freq: Two times a day (BID) | ORAL | 3 refills | Status: DC

## 2017-08-25 NOTE — Progress Notes (Signed)
   CC: HTN follow up  HPI:  [name] is a 36 y.o. male with past medical history outlined below here for HTN follow up and to establish care. For the details of today's visit, please refer to the assessment and plan.  Past Medical History:  Diagnosis Date  . Alcoholic fatty liver   . Deviated nasal septum   . GERD (gastroesophageal reflux disease)   . Hyperlipidemia   . Hypertension    CONTROLLED ON MEDS  . Migraines    EVERY OTHER DAY  . Nasal turbinate hypertrophy   . Neuromuscular disorder (HCC)    MUSCLE TWITCHES OCCASIONALLY  . Tinnitus    RIGHT EAR  . Vertigo    HX OF    Review of Systems  Musculoskeletal:       Muscle twitching   Neurological: Positive for sensory change.    Family History: Mom with HTN and DM, Dad with HTN, Sister with HTN and DM   Social History: Smokes 1 pack every 3-4 days. Drinks twice a week, 3-4 beers at a time. Denies illicit substance use.   Physical Exam:  Vitals:   08/25/17 0906  BP: (!) 145/91  Pulse: 70  Temp: 97.8 F (36.6 C)  TempSrc: Oral  SpO2: 100%  Weight: 218 lb 12.8 oz (99.2 kg)  Height: 6' (1.829 m)    Constitutional: NAD, appears comfortable Cardiovascular: RRR, no murmurs, rubs, or gallops.  Pulmonary/Chest: CTAB, no wheezes, rales, or rhonchi.  Extremities: Warm and well perfused. No edema.  Psychiatric: Normal mood and affect  Assessment & Plan:   See Encounters Tab for problem based charting.  Patient discussed with Dr. Heide Spark

## 2017-08-25 NOTE — Assessment & Plan Note (Addendum)
Patient has a history of chronic muscle fasciculations, migraines, and chronically elevated CK for which he follows with neurology. He has been extensively worked up and evaluated by a Product/process development scientist. He had EMG and nerve conductions studies done in October 2016. Studies were negative for any evidence of neuropathy, radiculopathy, myopathy, myositis, or neuromuscular disease, however did have witnessed fasciculations during the study. He also endorses intermittent paresthesias and blurry vision. He had an MRI done in 2016 that revealed nonspecific scattered periventricular and subcortical hyperintensities possibly consistent with autoimmune, inflammatory, postinfectious, microvascular ischemic, or migraine associated changes. He has also had extensive lab work including CBC, CMP, ANA, A1c, B12, folate, vitamin B6, vitamin B-1, RPR, TSH, vitamin D, serum copper, ceruloplasmin, MMA, and CRP per neurology that were all normal/nonreactive. 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 Unfortunately he has lost insurance and will not be able to follow-up with neurology until January. Of note, patient reports that he has been on chronic daily Valtrex therapy for the past year. Per chart review it is unclear why this was prescribed and I cannot find a clear indication. 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 He denies ever having a herpes rash outbreak. I do not see any mention of the need for Valtrex therapy in neurology's documentation. We will discontinue today. -- F/u with neurology when able  -- Discontinue daily valtrex

## 2017-08-25 NOTE — Patient Instructions (Signed)
Mr. Salamone,  It was a pleasure to see you toady. I have sent refills of your blood pressure medication to your pharmacy. You may stop taking valtrex. I have also given you a prescription for Wellbutrin to help you stop smoking. Please take this twice a day for the next few months. Please follow up with [place] in 3 months. If you have any questions or concerns, call our clinic at [phone] or after hours call [phone] and ask for the internal medicine resident on call. Thank you!  - Dr. Antony Contras

## 2017-08-25 NOTE — Assessment & Plan Note (Addendum)
Patient is here for blood pressure follow-up and to establish care. Previously followed with [facility]. He is prescribed Hyzaar 50-12.5 mg daily but ran out of his medication 2 weeks ago. Blood pressure is elevated today, 145/91. -- Refilled Hyzaar 50-12.5 mg daily  -- F/u BMP  ADDENDUM: BMP within normal range. Called patient with results.

## 2017-08-25 NOTE — Assessment & Plan Note (Signed)
Patient is a smoker, currently smokes 1 pack every 3-4 days. He expresses a desire to quit. He has tried nicotine replacement therapy including patches and gum in the past without success. He is interested in pharmacological therapy. We discussed Chantix versus Wellbutrin. Given that he is currently uninsured we will start with Wellbutrin. This also may provide some benefit given his history of anxiety and depression. -- Wellbutrin 150 mg BID -- Encouraged cessation

## 2017-08-26 LAB — BMP8+ANION GAP
ANION GAP: 17 mmol/L (ref 10.0–18.0)
BUN/Creatinine Ratio: 10 (ref 9–20)
BUN: 10 mg/dL (ref 6–20)
CO2: 23 mmol/L (ref 20–29)
CREATININE: 1 mg/dL (ref 0.76–1.27)
Calcium: 9.5 mg/dL (ref 8.7–10.2)
Chloride: 99 mmol/L (ref 96–106)
GFR calc Af Amer: 114 mL/min/{1.73_m2} (ref >59)
GFR, EST NON AFRICAN AMERICAN: 98 mL/min/{1.73_m2} (ref >59)
GLUCOSE: 93 mg/dL (ref 65–99)
Potassium: 3.7 mmol/L (ref 3.5–5.2)
Sodium: 139 mmol/L (ref 134–144)

## 2017-08-26 NOTE — Progress Notes (Signed)
Internal Medicine Clinic Attending  Case discussed with Dr. Guilloud at the time of the visit.  We reviewed the resident's history and exam and pertinent patient test results.  I agree with the assessment, diagnosis, and plan of care documented in the resident's note.  

## 2017-11-22 ENCOUNTER — Encounter: Payer: Self-pay | Admitting: Internal Medicine

## 2017-12-06 ENCOUNTER — Encounter: Payer: Self-pay | Admitting: Internal Medicine

## 2018-01-23 ENCOUNTER — Encounter (HOSPITAL_COMMUNITY): Payer: Self-pay

## 2018-01-23 ENCOUNTER — Emergency Department (HOSPITAL_COMMUNITY): Payer: BLUE CROSS/BLUE SHIELD

## 2018-01-23 DIAGNOSIS — F17210 Nicotine dependence, cigarettes, uncomplicated: Secondary | ICD-10-CM | POA: Diagnosis not present

## 2018-01-23 DIAGNOSIS — R0789 Other chest pain: Secondary | ICD-10-CM | POA: Insufficient documentation

## 2018-01-23 DIAGNOSIS — R200 Anesthesia of skin: Secondary | ICD-10-CM | POA: Diagnosis not present

## 2018-01-23 DIAGNOSIS — Z79899 Other long term (current) drug therapy: Secondary | ICD-10-CM | POA: Diagnosis not present

## 2018-01-23 DIAGNOSIS — I10 Essential (primary) hypertension: Secondary | ICD-10-CM | POA: Diagnosis not present

## 2018-01-23 DIAGNOSIS — R202 Paresthesia of skin: Secondary | ICD-10-CM | POA: Diagnosis not present

## 2018-01-23 NOTE — ED Triage Notes (Signed)
Pt presents with c/o chest pain in the upper left side of his chest that started yesterday. Pt also c/o numbness in his fingertips that started yesterday as well. Pt reports some dizziness but denies any shortness of breath. Pt reports he has had this problem in the past on and off and it does eventually resolve itself. NAD.

## 2018-01-24 ENCOUNTER — Emergency Department (HOSPITAL_COMMUNITY): Payer: BLUE CROSS/BLUE SHIELD

## 2018-01-24 ENCOUNTER — Emergency Department (HOSPITAL_COMMUNITY)
Admission: EM | Admit: 2018-01-24 | Discharge: 2018-01-24 | Disposition: A | Discharge status: Home / Self Care | Payer: BLUE CROSS/BLUE SHIELD | Attending: Emergency Medicine | Admitting: Emergency Medicine

## 2018-01-24 DIAGNOSIS — R202 Paresthesia of skin: Secondary | ICD-10-CM

## 2018-01-24 DIAGNOSIS — R0789 Other chest pain: Secondary | ICD-10-CM

## 2018-01-24 DIAGNOSIS — R200 Anesthesia of skin: Secondary | ICD-10-CM

## 2018-01-24 LAB — I-STAT TROPONIN, ED: Troponin i, poc: 0 ng/mL (ref 0.00–0.08)

## 2018-01-24 LAB — BASIC METABOLIC PANEL
ANION GAP: 10 (ref 5–15)
BUN: 13 mg/dL (ref 6–20)
CHLORIDE: 104 mmol/L (ref 101–111)
CO2: 25 mmol/L (ref 22–32)
Calcium: 9.5 mg/dL (ref 8.9–10.3)
Creatinine, Ser: 1.04 mg/dL (ref 0.61–1.24)
GFR calc non Af Amer: >60 mL/min (ref >60)
Glucose, Bld: 90 mg/dL (ref 65–99)
POTASSIUM: 3.8 mmol/L (ref 3.5–5.1)
Sodium: 139 mmol/L (ref 135–145)

## 2018-01-24 LAB — CBC
HEMATOCRIT: 40 % (ref 39.0–52.0)
HEMOGLOBIN: 13.9 g/dL (ref 13.0–17.0)
MCH: 30.7 pg (ref 26.0–34.0)
MCHC: 34.8 g/dL (ref 30.0–36.0)
MCV: 88.3 fL (ref 78.0–100.0)
Platelets: 197 10*3/uL (ref 150–400)
RBC: 4.53 MIL/uL (ref 4.22–5.81)
RDW: 13.1 % (ref 11.5–15.5)
WBC: 5.6 10*3/uL (ref 4.0–10.5)

## 2018-01-24 MED ORDER — MELOXICAM 15 MG PO TABS
15.0000 mg | ORAL_TABLET | Freq: Once | ORAL | Status: AC
  Administered 2018-01-24: 15 mg via ORAL
  Filled 2018-01-24: qty 1

## 2018-01-24 NOTE — Discharge Instructions (Signed)
As discussed, your evaluation today has been largely reassuring.  But, it is important that you monitor your condition carefully, and do not hesitate to return to the ED if you develop new, or concerning changes in your condition.  Please take Naprosyn, 500 mg, twice daily for the next week.  Otherwise, please follow-up with your physician for appropriate ongoing care.

## 2018-01-24 NOTE — ED Provider Notes (Signed)
[facility]-EMERGENCY DEPT Provider Note   CSN: [id number] Arrival date & time: 01/23/18  2318     History   Chief Complaint Chief Complaint  Patient presents with  . Chest Pain    HPI Douglas Wong is a 37 y.o. male.  HPI Patient presents with concern of chest pain, and arm numbness and tingling. He notes that the arm tingling has been present for about 2 years, is episodic, inconsistently better and worse.  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 The numbness and tingling is primarily about the proximal hand, more palmar than dorsal, with extension to the fingertips, but sparing and ulnar nerve distribution. It is not necessarily associated with his chest pain. Chest pain is sharp, episodic, left-sided, nonradiating, with no dyspnea, no nausea, vomiting. He notes that he has had this pain in the past as well, with prior evaluations that were reassuring. Patient states that beyond hypertension he is a generally healthy male, active, takes his medication regularly. Past Medical History:  Diagnosis Date  . Alcoholic fatty liver   . Deviated nasal septum   . GERD (gastroesophageal reflux disease)   . Hyperlipidemia   . Hypertension    CONTROLLED ON MEDS  . Migraines    EVERY OTHER DAY  . Nasal turbinate hypertrophy   . Neuromuscular disorder (HCC)    MUSCLE TWITCHES OCCASIONALLY  . Tinnitus    RIGHT EAR  . Vertigo    HX OF    Patient Active Problem List   Diagnosis Date Noted  . Tobacco abuse 08/25/2017  . Elevated CPK 10/22/2015  . Fasciculations 10/20/2015  . Dizziness and giddiness 09/03/2014  . Numbness and tingling 09/03/2014  . Blurry vision 07/31/2014  . HTN (hypertension) 07/31/2014  . Scapular dyskinesis 07/11/2014    Past Surgical History:  Procedure Laterality Date  . COLONOSCOPY    . NO PAST SURGERIES    . SEPTOPLASTY Bilateral 09/02/2016   Procedure: SEPTOPLASTY;  Surgeon: Vernie Murders, MD;  Location: [facility];  Service: ENT;   Laterality: Bilateral;  . TURBINATE REDUCTION Bilateral 09/02/2016   Procedure: TURBINATE REDUCTION;  Surgeon: Vernie Murders, MD;  Location: [facility];  Service: ENT;  Laterality: Bilateral;       Home Medications    Prior to Admission medications   Medication Sig Start Date End Date Taking? Authorizing Provider  buPROPion (WELLBUTRIN SR) 150 MG 12 hr tablet Take 1 tablet (150 mg total) by mouth 2 (two) times daily. 08/25/17   Reymundo Poll, MD  losartan-hydrochlorothiazide (HYZAAR) 50-12.5 MG tablet Take 1 tablet by mouth daily. 08/25/17   Reymundo Poll, MD  OVER THE COUNTER MEDICATION Take by mouth daily. MORINGA    [provider]    Family History Family History  Problem Relation Age of Onset  . Diabetes Mother   . Hypertension Mother   . Cancer Father        liver  . Hypertension Father   . Cancer Paternal Uncle        coon cancer     Social History Social History   Tobacco Use  . Smoking status: Current Every Day Smoker    Packs/day: 0.30    Years: 15.00    Pack years: 4.50    Types: Cigarettes  . Smokeless tobacco: Never Used  . Tobacco comment: trouble getting started  Substance Use Topics  . Alcohol use: Yes    Alcohol/week: 2.4 oz    Types: 4 Cans of beer per week  Comment: social  . Drug use: No     Allergies   Patient has no known allergies.   Review of Systems Review of Systems  Constitutional:       Per HPI, otherwise negative  HENT:       Per HPI, otherwise negative  Respiratory:       Per HPI, otherwise negative  Cardiovascular:       Per HPI, otherwise negative  Gastrointestinal: Negative for vomiting.  Endocrine:       Negative aside from HPI  Genitourinary:       Neg aside from HPI   Musculoskeletal:       Per HPI, otherwise negative  Skin: Negative.   Neurological: Positive for numbness. Negative for syncope.     Physical Exam Updated Vital Signs BP 138/89 (BP Location: Right Arm)   Pulse 67    Temp 98.3 F (36.8 C) (Oral)   Resp 16   Ht 6' (1.829 m)   Wt 98.9 kg (218 lb)   SpO2 100%   BMI 29.57 kg/m   Physical Exam  Constitutional: He is oriented to person, place, and time. He appears well-developed. No distress.  HENT:  Head: Normocephalic and atraumatic.  Eyes: Conjunctivae and EOM are normal.  Cardiovascular: Normal rate and regular rhythm.  Pulmonary/Chest: Effort normal. No stridor. No respiratory distress.  Abdominal: He exhibits no distension.  Musculoskeletal: He exhibits no edema.       Arms: Neurological: He is alert and oriented to person, place, and time. He displays no atrophy and no tremor. No cranial nerve deficit or sensory deficit. He exhibits normal muscle tone. He displays no seizure activity.  Patient's hand complaints are subjective, objectively he has no loss of function, sensation or strength in the hand, wrist.   Skin: Skin is warm and dry.  Psychiatric: He has a normal mood and affect.  Nursing note and vitals reviewed.    ED Treatments / Results  Labs (all labs ordered are listed, but only abnormal results are displayed) Labs Reviewed  BASIC METABOLIC PANEL  CBC  I-STAT TROPONIN, ED    EKG  EKG Interpretation  Date/Time:  Monday January 23 2018 23:35:54 EDT Ventricular Rate:  69 PR Interval:    QRS Duration: 83 QT Interval:  355 QTC Calculation: 381 R Axis:   17 Text Interpretation:  Sinus rhythm Consider left atrial enlargement RSR' in V1 or V2, probably normal variant No significant change since last tracing Confirmed by Shaune PollackIsaacs, Cameron [phone]([id number]) on 01/23/2018 11:39:03 PM Also confirmed by Shaune PollackIsaacs, Cameron [phone]), editor Elita QuickWatlington, Beverly [id number])  on 01/24/2018 7:05:42 AM       Radiology Dg Chest 2 View  Result Date: 01/24/2018 CLINICAL DATA:  Subacute onset of intermittent left upper anterior chest pain. Dizziness. High blood pressure. EXAM: CHEST - 2 VIEW COMPARISON:  Chest radiograph performed 06/25/2016 FINDINGS: The  lungs are well-aerated and clear. There is no evidence of focal opacification, pleural effusion or pneumothorax. The heart is normal in size; the mediastinal contour is within normal limits. No acute osseous abnormalities are seen. IMPRESSION: No acute cardiopulmonary process seen. Electronically Signed   By: Roanna RaiderJeffery  Chang M.D.   On: 01/24/2018 00:33    Procedures Procedures (including critical care time)  Medications Ordered in ED Medications  meloxicam (MOBIC) tablet 15 mg (not administered)     Initial Impression / Assessment and Plan / ED Course  I have reviewed the triage vital signs and the nursing notes.  Pertinent labs &  imaging results that were available during my care of the patient were reviewed by me and considered in my medical decision making (see chart for details).  Generally healthy young male presents with episodic sharp chest pain.  On here he is awake, alert, in no distress. Vital signs, EKG reassuring, 2 normal troponin, nonischemic echocardiogram reassuring for low suspicion of ACS. Patient's arm pain is likely secondary to peripheral neuropathy, particular given the patient's occupation. We discussed follow-up, including physical therapy/chiropractic care With reassuring findings here, no evidence for ACS, PE, pneumonia, or other acute new phenomena, patient discharged in stable condition with ongoing anti-inflammatories.  Final Clinical Impressions(s) / ED Diagnoses  Atypical chest pain Numbness and tingling, left upper extremity   Gerhard Munch, MD 01/24/18 0745

## 2018-03-07 ENCOUNTER — Encounter: Payer: Self-pay | Admitting: Internal Medicine

## 2018-07-11 DIAGNOSIS — I10 Essential (primary) hypertension: Secondary | ICD-10-CM | POA: Diagnosis not present

## 2018-07-11 DIAGNOSIS — F17210 Nicotine dependence, cigarettes, uncomplicated: Secondary | ICD-10-CM | POA: Insufficient documentation

## 2018-07-11 DIAGNOSIS — M79661 Pain in right lower leg: Secondary | ICD-10-CM | POA: Diagnosis not present

## 2018-07-11 DIAGNOSIS — Z79899 Other long term (current) drug therapy: Secondary | ICD-10-CM | POA: Diagnosis not present

## 2018-07-12 ENCOUNTER — Emergency Department (HOSPITAL_COMMUNITY)
Admission: EM | Admit: 2018-07-12 | Discharge: 2018-07-12 | Disposition: A | Discharge status: Home / Self Care | Payer: BLUE CROSS/BLUE SHIELD | Attending: Emergency Medicine | Admitting: Emergency Medicine

## 2018-07-12 ENCOUNTER — Encounter (HOSPITAL_COMMUNITY): Payer: Self-pay

## 2018-07-12 ENCOUNTER — Ambulatory Visit (HOSPITAL_BASED_OUTPATIENT_CLINIC_OR_DEPARTMENT_OTHER)
Admission: RE | Admit: 2018-07-12 | Discharge: 2018-07-12 | Disposition: A | Discharge status: Home / Self Care | Payer: BLUE CROSS/BLUE SHIELD | Source: Ambulatory Visit | Attending: Emergency Medicine | Admitting: Emergency Medicine

## 2018-07-12 DIAGNOSIS — M79609 Pain in unspecified limb: Secondary | ICD-10-CM | POA: Diagnosis not present

## 2018-07-12 DIAGNOSIS — M79661 Pain in right lower leg: Secondary | ICD-10-CM

## 2018-07-12 MED ORDER — ENOXAPARIN SODIUM 150 MG/ML ~~LOC~~ SOLN
1.5000 mg/kg | Freq: Once | SUBCUTANEOUS | Status: AC
  Administered 2018-07-12: 150 mg via SUBCUTANEOUS
  Filled 2018-07-12: qty 0.99

## 2018-07-12 NOTE — ED Notes (Signed)
Pt complains of right lower leg pain since last Sunday, he thought he was dehydrated but the pain didn't go away and yesterday it became warm and tender to touch

## 2018-07-12 NOTE — ED Notes (Signed)
Bed: WTR6 Expected date:  Expected time:  Means of arrival:  Comments: 

## 2018-07-12 NOTE — ED Provider Notes (Signed)
[facility]-EMERGENCY DEPT Provider Note   CSN: [id number] Arrival date & time: 07/11/18  2344     History   Chief Complaint No chief complaint on file.   HPI Douglas Wong is a 37 y.o. male.   37 year old male presents to the emergency department for evaluation of right lower extremity pain.  Notes onset of symptoms 9 days ago.  Has a history of muscle cramps and spasms, but felt this was different.  Attributed his pain to dehydration.  Has had no improvement in symptoms with ibuprofen.  Pain to the right calf is slightly aggravated with ambulation.  He describes sporadic, fleeting sharp burning pains in his calf radiating to his ankle.  Felt the area was becoming more warm and tender to the touch.  Expresses concern for DVT.  No prior history of DVT or PE.  Symptoms are atraumatic in onset.  Denies any recent surgeries or hospitalizations.  No prolonged travel.     Past Medical History:  Diagnosis Date  . Alcoholic fatty liver   . Deviated nasal septum   . GERD (gastroesophageal reflux disease)   . Hyperlipidemia   . Hypertension    CONTROLLED ON MEDS  . Migraines    EVERY OTHER DAY  . Nasal turbinate hypertrophy   . Neuromuscular disorder (HCC)    MUSCLE TWITCHES OCCASIONALLY  . Tinnitus    RIGHT EAR  . Vertigo    HX OF    Patient Active Problem List   Diagnosis Date Noted  . Tobacco abuse 08/25/2017  . Elevated CPK 10/22/2015  . Fasciculations 10/20/2015  . Dizziness and giddiness 09/03/2014  . Numbness and tingling 09/03/2014  . Blurry vision 07/31/2014  . HTN (hypertension) 07/31/2014  . Scapular dyskinesis 07/11/2014    Past Surgical History:  Procedure Laterality Date  . COLONOSCOPY    . NO PAST SURGERIES    . SEPTOPLASTY Bilateral 09/02/2016   Procedure: SEPTOPLASTY;  Surgeon: Vernie Murders, MD;  Location: [facility];  Service: ENT;  Laterality: Bilateral;  . TURBINATE REDUCTION Bilateral 09/02/2016   Procedure:  TURBINATE REDUCTION;  Surgeon: Vernie Murders, MD;  Location: [facility];  Service: ENT;  Laterality: Bilateral;        Home Medications    Prior to Admission medications   Medication Sig Start Date End Date Taking? Authorizing Provider  buPROPion (WELLBUTRIN SR) 150 MG 12 hr tablet Take 1 tablet (150 mg total) by mouth 2 (two) times daily. Patient not taking: Reported on 01/24/2018 08/25/17   Reymundo Poll, MD  losartan-hydrochlorothiazide (HYZAAR) 100-12.5 MG tablet Take 1 tablet by mouth daily. 12/22/17   [provider]  losartan-hydrochlorothiazide (HYZAAR) 50-12.5 MG tablet Take 1 tablet by mouth daily. Patient not taking: Reported on 01/24/2018 08/25/17   Reymundo Poll, MD  naproxen sodium (ALEVE) 220 MG tablet Take 440 mg by mouth daily as needed (pain.).    [provider]  topiramate (TOPAMAX) 50 MG tablet Take 50-100 mg by mouth daily as needed for headache. 01/09/18   [provider]    Family History Family History  Problem Relation Age of Onset  . Diabetes Mother   . Hypertension Mother   . Cancer Father        liver  . Hypertension Father   . Cancer Paternal Uncle        coon cancer     Social History Social History   Tobacco Use  . Smoking status: Current Every Day Smoker  Packs/day: 0.30    Years: 15.00    Pack years: 4.50    Types: Cigarettes  . Smokeless tobacco: Never Used  . Tobacco comment: trouble getting started  Substance Use Topics  . Alcohol use: Yes    Alcohol/week: 4.0 standard drinks    Types: 4 Cans of beer per week    Comment: social  . Drug use: No     Allergies   Patient has no known allergies.   Review of Systems Review of Systems Ten systems reviewed and are negative for acute change, except as noted in the HPI.    Physical Exam Updated Vital Signs BP (!) 163/102   Pulse 72   Temp 98.7 F (37.1 C) (Oral)   Resp 20   SpO2 99%   Physical Exam  Constitutional: He is oriented  to person, place, and time. He appears well-developed and well-nourished. No distress.  Nontoxic appearing and in NAD  HENT:  Head: Normocephalic and atraumatic.  Eyes: Conjunctivae and EOM are normal. No scleral icterus.  Neck: Normal range of motion.  Cardiovascular: Normal rate, regular rhythm and intact distal pulses.  DP pulse 2+ in the RLE  Pulmonary/Chest: Effort normal. No respiratory distress.  Respirations even and unlabored  Musculoskeletal: Normal range of motion.  Mild TTP to the left calf without erythema, heat to touch, lymphangitic streaking. No palpable cords or edema in the RLE.  Neurological: He is alert and oriented to person, place, and time. He exhibits normal muscle tone. Coordination normal.  Sensation to light touch intact. Normal plantar and dorsiflexion against resistance.  Skin: Skin is warm and dry. No rash noted. He is not diaphoretic. No erythema. No pallor.  Psychiatric: He has a normal mood and affect. His behavior is normal.  Nursing note and vitals reviewed.    ED Treatments / Results  Labs (all labs ordered are listed, but only abnormal results are displayed) Labs Reviewed - No data to display  EKG None  Radiology No results found.  Procedures Procedures (including critical care time)  Medications Ordered in ED Medications  enoxaparin (LOVENOX) injection 150 mg (has no administration in time range)     Initial Impression / Assessment and Plan / ED Course  I have reviewed the triage vital signs and the nursing notes.  Pertinent labs & imaging results that were available during my care of the patient were reviewed by me and considered in my medical decision making (see chart for details).     37 year old male presents for right calf pain x9 days.  He is neurovascularly intact.  Compartments soft.  No concern for cellulitis or abscess on exam.  He expresses concern for DVT.  Will treat with Lovenox and schedule for outpatient venous  duplex.  Clinically, his symptoms seem less consistent with VTE.  More likely to be muscle spasms with component of neuropathic pain.  Have encouraged the use of NSAIDs if ultrasound negative.  Return precautions discussed and provided. Patient discharged in stable condition with no unaddressed concerns.   Final Clinical Impressions(s) / ED Diagnoses   Final diagnoses:  Right calf pain    ED Discharge Orders         Ordered    LE VENOUS     07/12/18 0142           Antony MaduraHumes, Almira Phet[place], PA-C 07/12/18 0204    Molpus, Jonny RuizJohn, MD 07/12/18 [phone]

## 2018-07-12 NOTE — Discharge Instructions (Signed)
You have been given a dose of a blood thinner in the emergency department.  This will cover for treatment of a blood clot for the next 24 hours.  Have an ultrasound completed to see if you have a blood clot in your right leg contributing to your pain.  If positive, you will need to continue a blood thinner for the next 6 months.  If ultrasound is negative for blood clot/DVT, continue with Tylenol or Motrin for pain.  Follow-up with a primary care doctor to ensure resolution of symptoms.

## 2018-07-12 NOTE — Progress Notes (Signed)
Right lower extremity venous duplex has been completed. Negative for DVT.  07/12/18 8:39 AM Olen CordialGreg Rena Hunke RVT

## 2018-09-15 DIAGNOSIS — E876 Hypokalemia: Secondary | ICD-10-CM | POA: Diagnosis not present

## 2018-09-15 DIAGNOSIS — Z79899 Other long term (current) drug therapy: Secondary | ICD-10-CM | POA: Diagnosis not present

## 2018-09-15 DIAGNOSIS — R253 Fasciculation: Secondary | ICD-10-CM | POA: Diagnosis not present

## 2018-09-15 DIAGNOSIS — I10 Essential (primary) hypertension: Secondary | ICD-10-CM | POA: Insufficient documentation

## 2018-09-15 DIAGNOSIS — I493 Ventricular premature depolarization: Secondary | ICD-10-CM | POA: Insufficient documentation

## 2018-09-15 DIAGNOSIS — F17210 Nicotine dependence, cigarettes, uncomplicated: Secondary | ICD-10-CM | POA: Insufficient documentation

## 2018-09-15 DIAGNOSIS — R002 Palpitations: Secondary | ICD-10-CM | POA: Diagnosis present

## 2018-09-15 NOTE — ED Triage Notes (Signed)
Pt is c/o a sudden onset of heart palpitation. Denies any cardiopulmonary hx.

## 2018-09-16 ENCOUNTER — Encounter (HOSPITAL_COMMUNITY): Payer: Self-pay | Admitting: Nurse Practitioner

## 2018-09-16 ENCOUNTER — Emergency Department (HOSPITAL_COMMUNITY)
Admission: EM | Admit: 2018-09-16 | Discharge: 2018-09-16 | Disposition: A | Discharge status: Home / Self Care | Payer: BLUE CROSS/BLUE SHIELD | Attending: Emergency Medicine | Admitting: Emergency Medicine

## 2018-09-16 ENCOUNTER — Other Ambulatory Visit: Payer: Self-pay

## 2018-09-16 DIAGNOSIS — I493 Ventricular premature depolarization: Secondary | ICD-10-CM

## 2018-09-16 DIAGNOSIS — E876 Hypokalemia: Secondary | ICD-10-CM

## 2018-09-16 DIAGNOSIS — R253 Fasciculation: Secondary | ICD-10-CM

## 2018-09-16 LAB — CBC
HCT: 43.2 % (ref 39.0–52.0)
Hemoglobin: 14.4 g/dL (ref 13.0–17.0)
MCH: 29.5 pg (ref 26.0–34.0)
MCHC: 33.3 g/dL (ref 30.0–36.0)
MCV: 88.5 fL (ref 80.0–100.0)
NRBC: 0 % (ref 0.0–0.2)
PLATELETS: 200 10*3/uL (ref 150–400)
RBC: 4.88 MIL/uL (ref 4.22–5.81)
RDW: 12.2 % (ref 11.5–15.5)
WBC: 6.4 10*3/uL (ref 4.0–10.5)

## 2018-09-16 LAB — BASIC METABOLIC PANEL
ANION GAP: 9 (ref 5–15)
BUN: 18 mg/dL (ref 6–20)
CALCIUM: 10 mg/dL (ref 8.9–10.3)
CO2: 27 mmol/L (ref 22–32)
Chloride: 103 mmol/L (ref 98–111)
Creatinine, Ser: 1.18 mg/dL (ref 0.61–1.24)
GFR calc Af Amer: >60 mL/min (ref >60)
GLUCOSE: 117 mg/dL — AB (ref 70–99)
POTASSIUM: 3.2 mmol/L — AB (ref 3.5–5.1)
Sodium: 139 mmol/L (ref 135–145)

## 2018-09-16 LAB — MAGNESIUM: MAGNESIUM: 2 mg/dL (ref 1.7–2.4)

## 2018-09-16 LAB — I-STAT TROPONIN, ED: TROPONIN I, POC: 0 ng/mL (ref 0.00–0.08)

## 2018-09-16 MED ORDER — POTASSIUM CHLORIDE CRYS ER 20 MEQ PO TBCR
40.0000 meq | EXTENDED_RELEASE_TABLET | Freq: Once | ORAL | Status: AC
  Administered 2018-09-16: 40 meq via ORAL
  Filled 2018-09-16: qty 2

## 2018-09-16 NOTE — ED Notes (Signed)
Bed: WA09 Expected date:  Expected time:  Means of arrival:  Comments: 

## 2018-09-16 NOTE — ED Provider Notes (Signed)
[facility]-EMERGENCY DEPT Provider Note: Douglas Dell, MD, FACEP  CSN: [id number] MRN: [id number] ARRIVAL: 09/15/18 at 2335 ROOM: WA10/WA10   CHIEF COMPLAINT  Palpitations   HISTORY OF PRESENT ILLNESS  09/16/18 12:12 AM Douglas Wong is a 37 y.o. male who has had a 2-year history of episodes of dizziness.  He describes the dizziness as a sensation that he might pass out but he has never passed out.  These episodes are often associated with shortness of breath and muscle fasciculations which she attributes to panic attacks.  He has worn a Holter monitor in the past which showed no cardiac arrhythmia.  He had several episodes yesterday evening.  He was able to video his right pectoralis major muscle fasciculating.  He states he gets similar fasciculations throughout his body at various times.  He had no associated chest pain, nausea or vomiting.   Past Medical History:  Diagnosis Date  . Alcoholic fatty liver   . Deviated nasal septum   . GERD (gastroesophageal reflux disease)   . Hyperlipidemia   . Hypertension    CONTROLLED ON MEDS  . Migraines    EVERY OTHER DAY  . Nasal turbinate hypertrophy   . Neuromuscular disorder (HCC)    MUSCLE TWITCHES OCCASIONALLY  . Tinnitus    RIGHT EAR  . Vertigo    HX OF    Past Surgical History:  Procedure Laterality Date  . COLONOSCOPY    . NO PAST SURGERIES    . SEPTOPLASTY Bilateral 09/02/2016   Procedure: SEPTOPLASTY;  Surgeon: Vernie Murders, MD;  Location: [facility];  Service: ENT;  Laterality: Bilateral;  . TURBINATE REDUCTION Bilateral 09/02/2016   Procedure: TURBINATE REDUCTION;  Surgeon: Vernie Murders, MD;  Location: [facility];  Service: ENT;  Laterality: Bilateral;    Family History  Problem Relation Age of Onset  . Diabetes Mother   . Hypertension Mother   . Cancer Father        liver  . Hypertension Father   . Cancer Paternal Uncle        coon cancer     Social History   Tobacco Use  . Smoking  status: Current Every Day Smoker    Packs/day: 0.30    Years: 15.00    Pack years: 4.50    Types: Cigarettes  . Smokeless tobacco: Never Used  . Tobacco comment: trouble getting started  Substance Use Topics  . Alcohol use: Yes    Alcohol/week: 4.0 standard drinks    Types: 4 Cans of beer per week    Comment: social  . Drug use: No    Prior to Admission medications   Medication Sig Start Date End Date Taking? Authorizing Provider  buPROPion (WELLBUTRIN SR) 150 MG 12 hr tablet Take 1 tablet (150 mg total) by mouth 2 (two) times daily. Patient not taking: Reported on 01/24/2018 08/25/17   Reymundo Poll, MD  losartan-hydrochlorothiazide (HYZAAR) 100-12.5 MG tablet Take 1 tablet by mouth daily. 12/22/17   [provider]  losartan-hydrochlorothiazide (HYZAAR) 50-12.5 MG tablet Take 1 tablet by mouth daily. Patient not taking: Reported on 01/24/2018 08/25/17   Reymundo Poll, MD  naproxen sodium (ALEVE) 220 MG tablet Take 440 mg by mouth daily as needed (pain.).    [provider]  topiramate (TOPAMAX) 50 MG tablet Take 50-100 mg by mouth daily as needed for headache. 01/09/18   [provider]    Allergies Patient has no known allergies.   REVIEW OF SYSTEMS  Negative  except as noted here or in the History of Present Illness.   PHYSICAL EXAMINATION  Initial Vital Signs Blood pressure (!) 158/95, pulse 80, temperature 98.8 F (37.1 C), temperature source Oral, resp. rate 18, height 5\' 11"  (1.803 m), weight 96.6 kg, SpO2 98 %.  Examination General: Well-developed, well-nourished male in no acute distress; appearance consistent with age of record HENT: normocephalic; atraumatic Eyes: pupils equal, round and reactive to light; extraocular muscles intact Neck: supple Heart: regular rate and rhythm Lungs: clear to auscultation bilaterally Abdomen: soft; nondistended; nontender; no masses or hepatosplenomegaly; bowel sounds present Extremities: No  deformity; full range of motion Neurologic: Awake, alert and oriented; motor function intact in all extremities and symmetric; no facial droop Skin: Warm and dry Psychiatric: Normal mood and affect   RESULTS  Summary of this visit's results, reviewed by myself:   EKG Interpretation  Date/Time:  Friday September 15 2018 23:49:09 EDT Ventricular Rate:  76 PR Interval:    QRS Duration: 83 QT Interval:  353 QTC Calculation: 397 R Axis:   23 Text Interpretation:  Sinus rhythm Nonspecific T abnormalities, lateral leads Minimal ST elevation, anterior leads No significant change was found Confirmed by Paula Libra ([id number]) on 09/15/2018 11:54:17 PM      Laboratory Studies: Results for orders placed or performed during the hospital encounter of 09/16/18 (from the past 24 hour(s))  Basic metabolic panel     Status: Abnormal   Collection Time: 09/16/18 12:10 AM  Result Value Ref Range   Sodium 139 135 - 145 mmol/L   Potassium 3.2 (L) 3.5 - 5.1 mmol/L   Chloride 103 98 - 111 mmol/L   CO2 27 22 - 32 mmol/L   Glucose, Bld 117 (H) 70 - 99 mg/dL   BUN 18 6 - 20 mg/dL   Creatinine, Ser 1.91 0.61 - 1.24 mg/dL   Calcium 47.8 8.9 - 29.5 mg/dL   GFR calc non Af Amer >60 >60 mL/min   GFR calc Af Amer >60 >60 mL/min   Anion gap 9 5 - 15  CBC     Status: None   Collection Time: 09/16/18 12:10 AM  Result Value Ref Range   WBC 6.4 4.0 - 10.5 K/uL   RBC 4.88 4.22 - 5.81 MIL/uL   Hemoglobin 14.4 13.0 - 17.0 g/dL   HCT 62.1 30.8 - 65.7 %   MCV 88.5 80.0 - 100.0 fL   MCH 29.5 26.0 - 34.0 pg   MCHC 33.3 30.0 - 36.0 g/dL   RDW 84.6 96.2 - 95.2 %   Platelets 200 150 - 400 K/uL   nRBC 0.0 0.0 - 0.2 %  I-stat troponin, ED     Status: None   Collection Time: 09/16/18 12:16 AM  Result Value Ref Range   Troponin i, poc 0.00 0.00 - 0.08 ng/mL   Comment 3          Magnesium     Status: None   Collection Time: 09/16/18 12:21 AM  Result Value Ref Range   Magnesium 2.0 1.7 - 2.4 mg/dL   Imaging  Studies: No results found.  ED COURSE and MDM  Nursing notes and initial vitals signs, including pulse oximetry, reviewed.  Vitals:   09/15/18 2348 09/16/18 0041  BP: (!) 158/95 (!) 135/92  Pulse: 80 77  Resp: 18 20  Temp: 98.8 F (37.1 C)   TempSrc: Oral   SpO2: 98% 100%  Weight: 96.6 kg   Height: 5\' 11"  (1.803 m)    1:17  AM The patient had 2 PVCs noted on the monitor during his ED stay.  His potassium is slightly low and we will give him potassium replacement.  The video he showed was consistent with muscle fasciculations.  He was advised to follow-up with his primary care physician if symptoms persist.  We will refer to cardiology for evaluation of his palpitations.   PROCEDURES    ED DIAGNOSES     ICD-10-CM   1. PVC's (premature ventricular contractions) I49.3   2. Muscle fasciculation R25.3   3. Hypokalemia E87.6        Ella Guillotte, Jonny Ruiz, MD 09/16/18 [phone]

## 2018-10-17 ENCOUNTER — Ambulatory Visit: Payer: BLUE CROSS/BLUE SHIELD | Admitting: Neurology

## 2018-10-18 ENCOUNTER — Encounter: Payer: Self-pay | Admitting: Neurology

## 2018-11-27 DIAGNOSIS — R0789 Other chest pain: Secondary | ICD-10-CM | POA: Insufficient documentation

## 2018-11-27 DIAGNOSIS — I10 Essential (primary) hypertension: Secondary | ICD-10-CM | POA: Insufficient documentation

## 2018-11-27 DIAGNOSIS — Z79899 Other long term (current) drug therapy: Secondary | ICD-10-CM | POA: Diagnosis not present

## 2018-11-27 DIAGNOSIS — Z87891 Personal history of nicotine dependence: Secondary | ICD-10-CM | POA: Diagnosis not present

## 2018-11-27 DIAGNOSIS — R0602 Shortness of breath: Secondary | ICD-10-CM | POA: Insufficient documentation

## 2018-11-28 ENCOUNTER — Encounter (HOSPITAL_COMMUNITY): Payer: Self-pay | Admitting: Family Medicine

## 2018-11-28 ENCOUNTER — Other Ambulatory Visit: Payer: Self-pay

## 2018-11-28 ENCOUNTER — Emergency Department (HOSPITAL_COMMUNITY): Payer: BLUE CROSS/BLUE SHIELD

## 2018-11-28 ENCOUNTER — Emergency Department (HOSPITAL_COMMUNITY)
Admission: EM | Admit: 2018-11-28 | Discharge: 2018-11-28 | Disposition: A | Discharge status: Home / Self Care | Payer: BLUE CROSS/BLUE SHIELD | Attending: Emergency Medicine | Admitting: Emergency Medicine

## 2018-11-28 DIAGNOSIS — R0789 Other chest pain: Secondary | ICD-10-CM

## 2018-11-28 LAB — BASIC METABOLIC PANEL
ANION GAP: 10 (ref 5–15)
BUN: 16 mg/dL (ref 6–20)
CHLORIDE: 103 mmol/L (ref 98–111)
CO2: 26 mmol/L (ref 22–32)
Calcium: 9.9 mg/dL (ref 8.9–10.3)
Creatinine, Ser: 0.98 mg/dL (ref 0.61–1.24)
Glucose, Bld: 90 mg/dL (ref 70–99)
POTASSIUM: 4.1 mmol/L (ref 3.5–5.1)
SODIUM: 139 mmol/L (ref 135–145)

## 2018-11-28 LAB — POCT I-STAT TROPONIN I: TROPONIN I, POC: 0 ng/mL (ref 0.00–0.08)

## 2018-11-28 LAB — CBC
HEMATOCRIT: 39.7 % (ref 39.0–52.0)
Hemoglobin: 13.4 g/dL (ref 13.0–17.0)
MCH: 30.1 pg (ref 26.0–34.0)
MCHC: 33.8 g/dL (ref 30.0–36.0)
MCV: 89.2 fL (ref 80.0–100.0)
NRBC: 0 % (ref 0.0–0.2)
Platelets: 187 10*3/uL (ref 150–400)
RBC: 4.45 MIL/uL (ref 4.22–5.81)
RDW: 12.4 % (ref 11.5–15.5)
WBC: 5.6 10*3/uL (ref 4.0–10.5)

## 2018-11-28 LAB — I-STAT TROPONIN, ED: TROPONIN I, POC: 0 ng/mL (ref 0.00–0.08)

## 2018-11-28 MED ORDER — NAPROXEN 500 MG PO TABS
500.0000 mg | ORAL_TABLET | Freq: Once | ORAL | Status: AC
  Administered 2018-11-28: 500 mg via ORAL
  Filled 2018-11-28: qty 1

## 2018-11-28 MED ORDER — NAPROXEN 500 MG PO TABS: 500.0000 mg | ORAL_TABLET | Freq: Two times a day (BID) | ORAL | 0 refills | Status: AC

## 2018-11-28 NOTE — ED Triage Notes (Signed)
Patient is complaining of left sided chest pain that radiates to his back. Patient reports the pain started intermittently two days ago but has become constant. Patient describes pain as pressure. Associated symptoms are shortness of breath, dizziness, lightheadedness, and back pain. Denies taking any medication for his symptoms.

## 2018-11-28 NOTE — ED Provider Notes (Signed)
[facility]-EMERGENCY DEPT Provider Note   CSN: [id number] Arrival date & time: 11/27/18  2242     History   Chief Complaint Chief Complaint  Patient presents with  . Chest Pain    HPI Douglas Wong is a 38 y.o. male.  Douglas Wong is a 38 y.o. male with a history of hypertension, migraines, hyperlipidemia, GERD and alcoholic fatty liver disease, who presents to the emergency department for evaluation of intermittent left-sided chest pains.  He reports these pains have been occurring intermittently over the past week but over the past 2 days have increased in frequency.  He localizes the pain across the left lower chest and also reports some intermittent pains over his left shoulder blade.  Pain is primarily present with movement.  He reports he especially notices the pain when his left arm is lifted while driving.  It is not worse with deep breathing.  He reports that he occasionally feels a little bit short of breath.  No associated nausea, vomiting, abdominal pain.  No diaphoresis.  Has not had any syncope but has occasionally felt lightheaded.  No lower extremity swelling or pain.  No history of PE or DVT, no recent long distance travel, hospitalizations or surgeries.  Denies any fevers, cough or hemoptysis.  He has not taken anything to treat this pain prior to arrival.  He denies any history of prior cardiac events and no family history of heart disease.  Reports he recently quit smoking.  Denies any other drug use.     Past Medical History:  Diagnosis Date  . Alcoholic fatty liver   . Deviated nasal septum   . GERD (gastroesophageal reflux disease)   . Hyperlipidemia   . Hypertension    CONTROLLED ON MEDS  . Migraines    EVERY OTHER DAY  . Nasal turbinate hypertrophy   . Neuromuscular disorder (HCC)    MUSCLE TWITCHES OCCASIONALLY  . Tinnitus    RIGHT EAR  . Vertigo    HX OF    Patient Active Problem List   Diagnosis Date Noted  . Tobacco abuse  08/25/2017  . Elevated CPK 10/22/2015  . Fasciculations 10/20/2015  . Dizziness and giddiness 09/03/2014  . Numbness and tingling 09/03/2014  . Blurry vision 07/31/2014  . HTN (hypertension) 07/31/2014  . Scapular dyskinesis 07/11/2014    Past Surgical History:  Procedure Laterality Date  . COLONOSCOPY    . NO PAST SURGERIES    . SEPTOPLASTY Bilateral 09/02/2016   Procedure: SEPTOPLASTY;  Surgeon: Vernie Murders, MD;  Location: [facility];  Service: ENT;  Laterality: Bilateral;  . TURBINATE REDUCTION Bilateral 09/02/2016   Procedure: TURBINATE REDUCTION;  Surgeon: Vernie Murders, MD;  Location: [facility];  Service: ENT;  Laterality: Bilateral;        Home Medications    Prior to Admission medications   Medication Sig Start Date End Date Taking? Authorizing Provider  losartan-hydrochlorothiazide (HYZAAR) 100-12.5 MG tablet Take 1 tablet by mouth daily. 12/22/17   [provider]  naproxen sodium (ALEVE) 220 MG tablet Take 440 mg by mouth daily as needed (pain.).    [provider]  topiramate (TOPAMAX) 50 MG tablet Take 50-100 mg by mouth daily as needed for headache. 01/09/18   [provider]    Family History Family History  Problem Relation Age of Onset  . Diabetes Mother   . Hypertension Mother   . Cancer Father        liver  .  Hypertension Father   . Cancer Paternal Uncle        coon cancer     Social History Social History   Tobacco Use  . Smoking status: Former Smoker    Packs/day: 0.30    Years: 15.00    Pack years: 4.50    Types: Cigarettes    Last attempt to quit: 10/04/2018    Years since quitting: 0.1  . Smokeless tobacco: Never Used  . Tobacco comment: trouble getting started  Substance Use Topics  . Alcohol use: Yes    Alcohol/week: 4.0 standard drinks    Types: 4 Cans of beer per week    Comment: 4 times a month   . Drug use: No     Allergies   Patient has no known allergies.   Review of  Systems Review of Systems  Constitutional: Negative for chills and fever.  HENT: Negative.   Respiratory: Positive for shortness of breath. Negative for cough.   Cardiovascular: Positive for chest pain. Negative for palpitations and leg swelling.  Gastrointestinal: Negative for abdominal pain, nausea and vomiting.  Genitourinary: Negative for dysuria and frequency.  Musculoskeletal: Positive for back pain. Negative for arthralgias and myalgias.  Skin: Negative for color change and rash.  Neurological: Positive for light-headedness. Negative for dizziness, syncope and headaches.     Physical Exam Updated Vital Signs BP (!) 135/96 (BP Location: Left Arm)   Pulse 71   Temp 98.7 F (37.1 C)   Resp 18   Ht 6' (1.829 m)   Wt 98.1 kg   SpO2 100%   BMI 29.34 kg/m   Physical Exam Vitals signs and nursing note reviewed.  Constitutional:      General: He is not in acute distress.    Appearance: He is well-developed and normal weight. He is not ill-appearing, toxic-appearing or diaphoretic.  HENT:     Head: Normocephalic and atraumatic.  Eyes:     General:        Right eye: No discharge.        Left eye: No discharge.  Neck:     Musculoskeletal: Neck supple.     Vascular: No JVD.     Trachea: No tracheal deviation.  Cardiovascular:     Rate and Rhythm: Normal rate and regular rhythm.     Pulses:          Radial pulses are 2+ on the right side and 2+ on the left side.       Dorsalis pedis pulses are 2+ on the right side and 2+ on the left side.     Heart sounds: Normal heart sounds. No murmur. No friction rub. No gallop.   Pulmonary:     Effort: Pulmonary effort is normal. No respiratory distress.     Comments: Respirations equal and unlabored, patient able to speak in full sentences, lungs clear to auscultation bilaterally Chest:     Chest wall: Tenderness present.       Comments: Tenderness to palpation across the left lower chest wall as noted above, no overlying erythema  or skin changes Abdominal:     General: Bowel sounds are normal.     Palpations: Abdomen is soft. There is no mass.     Tenderness: There is no abdominal tenderness. There is no guarding or rebound.     Comments: Abdomen soft, nondistended, nontender to palpation in all quadrants without guarding or peritoneal signs  Musculoskeletal:     Right lower leg: He exhibits no tenderness.  No edema.     Left lower leg: He exhibits no tenderness. No edema.  Skin:    General: Skin is warm and dry.     Capillary Refill: Capillary refill takes less than 2 seconds.  Neurological:     Mental Status: He is alert and oriented to person, place, and time.     Coordination: Coordination normal.  Psychiatric:        Mood and Affect: Mood normal.        Behavior: Behavior normal.      ED Treatments / Results  Labs (all labs ordered are listed, but only abnormal results are displayed) Labs Reviewed  BASIC METABOLIC PANEL  CBC  I-STAT TROPONIN, ED  POCT I-STAT TROPONIN I  I-STAT TROPONIN, ED    EKG EKG Interpretation  Date/Time:  Tuesday November 28 2018 06:23:26 EST Ventricular Rate:  60 PR Interval:    QRS Duration: 109 QT Interval:  388 QTC Calculation: 388 R Axis:   38 Text Interpretation:  Sinus rhythm Probable left ventricular hypertrophy ST elev, probable normal early repol pattern U waves present Confirmed by Paula LibraMolpus, John ([id number]) on 11/28/2018 6:30:50 AM   Radiology Dg Chest 2 View  Result Date: 11/28/2018 CLINICAL DATA:  Left anterior chest pain x2 weeks EXAM: CHEST - 2 VIEW COMPARISON:  01/24/2018 FINDINGS: Lungs are clear.  No pleural effusion or pneumothorax. The heart is normal in size. Visualized osseous structures are within normal limits. IMPRESSION: Normal chest radiographs. Electronically Signed   By: Charline BillsSriyesh  Krishnan M.D.   On: 11/28/2018 00:39    Procedures Procedures (including critical care time)  Medications Ordered in ED Medications  naproxen (NAPROSYN) tablet  500 mg (500 mg Oral Given 11/28/18 0711)     Initial Impression / Assessment and Plan / ED Course  I have reviewed the triage vital signs and the nursing notes.  Pertinent labs & imaging results that were available during my care of the patient were reviewed by me and considered in my medical decision making (see chart for details).  Patient presents for evaluation of intermittent left-sided chest pains, pain is reproducible with palpation over the left lower chest wall and I suspect this is musculoskeletal chest wall pain.  Chest pain is not likely of cardiac or pulmonary etiology d/t presentation, PERC negative, VSS, no tracheal deviation, no JVD or new murmur, RRR, breath sounds equal bilaterally, EKG without acute abnormalities, negative troponin x2, and negative CXR. Patient is to be discharged with recommendation to follow up with PCP in regards to today's hospital visit.  Pt has been advised to return to the ED if CP becomes exertional, associated with diaphoresis or nausea, radiates to left jaw/arm, worsens or becomes concerning in any way. Pt appears reliable for follow up and is agreeable to discharge.      Final Clinical Impressions(s) / ED Diagnoses   Final diagnoses:  Atypical chest pain  Chest wall pain    ED Discharge Orders         Ordered    naproxen (NAPROSYN) 500 MG tablet  2 times daily     11/28/18 0810           Dartha LodgeFord,  N, PA-C 11/28/18 0819    Paula LibraMolpus, John, MD 11/28/18 2242

## 2018-11-28 NOTE — ED Notes (Signed)
ISTAT not ran due to not enough blood in tube.

## 2018-11-28 NOTE — Discharge Instructions (Signed)
Your work-up today is reassuring and does not suggest an acute problem with your heart or lungs causing your pain.  I think this is probably chest wall pain, please take naproxen twice daily as directed, you can also use heat, or over-the-counter muscle rubs like Biofreeze.  Follow-up with your regular doctor and make sure you continue taking your blood pressure and cholesterol medications as directed.  Return to the emergency department if you have worsening chest pain especially if it radiates to the arm neck or jaw, or is worse with exertion, you have shortness of breath feel lightheaded or like you may pass out or any other new or concerning symptoms.

## 2018-11-30 ENCOUNTER — Encounter (HOSPITAL_COMMUNITY): Payer: Self-pay

## 2018-11-30 ENCOUNTER — Other Ambulatory Visit: Payer: Self-pay

## 2018-11-30 ENCOUNTER — Emergency Department (HOSPITAL_COMMUNITY)
Admission: EM | Admit: 2018-11-30 | Discharge: 2018-11-30 | Disposition: A | Discharge status: Home / Self Care | Payer: BLUE CROSS/BLUE SHIELD | Attending: Emergency Medicine | Admitting: Emergency Medicine

## 2018-11-30 DIAGNOSIS — K921 Melena: Secondary | ICD-10-CM | POA: Insufficient documentation

## 2018-11-30 DIAGNOSIS — I10 Essential (primary) hypertension: Secondary | ICD-10-CM | POA: Diagnosis not present

## 2018-11-30 DIAGNOSIS — K625 Hemorrhage of anus and rectum: Secondary | ICD-10-CM | POA: Insufficient documentation

## 2018-11-30 DIAGNOSIS — Z87891 Personal history of nicotine dependence: Secondary | ICD-10-CM | POA: Diagnosis not present

## 2018-11-30 DIAGNOSIS — Z79899 Other long term (current) drug therapy: Secondary | ICD-10-CM | POA: Insufficient documentation

## 2018-11-30 DIAGNOSIS — K922 Gastrointestinal hemorrhage, unspecified: Secondary | ICD-10-CM | POA: Insufficient documentation

## 2018-11-30 DIAGNOSIS — R112 Nausea with vomiting, unspecified: Secondary | ICD-10-CM | POA: Insufficient documentation

## 2018-11-30 DIAGNOSIS — R0789 Other chest pain: Secondary | ICD-10-CM | POA: Diagnosis present

## 2018-11-30 LAB — COMPREHENSIVE METABOLIC PANEL
ALT: 52 U/L — ABNORMAL HIGH (ref 0–44)
ANION GAP: 12 (ref 5–15)
AST: 29 U/L (ref 15–41)
Albumin: 4.4 g/dL (ref 3.5–5.0)
Alkaline Phosphatase: 70 U/L (ref 38–126)
BUN: 12 mg/dL (ref 6–20)
CALCIUM: 9.2 mg/dL (ref 8.9–10.3)
CO2: 23 mmol/L (ref 22–32)
Chloride: 104 mmol/L (ref 98–111)
Creatinine, Ser: 1.02 mg/dL (ref 0.61–1.24)
GFR calc Af Amer: >60 mL/min (ref >60)
GFR calc non Af Amer: >60 mL/min (ref >60)
Glucose, Bld: 122 mg/dL — ABNORMAL HIGH (ref 70–99)
Potassium: 3.8 mmol/L (ref 3.5–5.1)
Sodium: 139 mmol/L (ref 135–145)
Total Bilirubin: 0.5 mg/dL (ref 0.3–1.2)
Total Protein: 7.3 g/dL (ref 6.5–8.1)

## 2018-11-30 LAB — CBC
HCT: 38.8 % — ABNORMAL LOW (ref 39.0–52.0)
Hemoglobin: 13.1 g/dL (ref 13.0–17.0)
MCH: 30 pg (ref 26.0–34.0)
MCHC: 33.8 g/dL (ref 30.0–36.0)
MCV: 88.8 fL (ref 80.0–100.0)
Platelets: 196 10*3/uL (ref 150–400)
RBC: 4.37 MIL/uL (ref 4.22–5.81)
RDW: 12.3 % (ref 11.5–15.5)
WBC: 5.9 10*3/uL (ref 4.0–10.5)
nRBC: 0 % (ref 0.0–0.2)

## 2018-11-30 LAB — ABO/RH: ABO/RH(D): O POS

## 2018-11-30 LAB — TYPE AND SCREEN
ABO/RH(D): O POS
Antibody Screen: NEGATIVE

## 2018-11-30 MED ORDER — OMEPRAZOLE 20 MG PO CPDR: 20.0000 mg | DELAYED_RELEASE_CAPSULE | Freq: Every day | ORAL | 1 refills | Status: DC

## 2018-11-30 NOTE — ED Triage Notes (Signed)
Patient reports that he saw a small amount of bright red blood in his stool yesterday. Today the patient states he vomited x 2 today once he saw a small amount of bright red blood in his emesis and the second time he said he saw dark blood small amount in chunk in the emesis. Patient denies any abdominal pain.

## 2018-11-30 NOTE — ED Provider Notes (Signed)
[facility]-EMERGENCY DEPT Provider Note   CSN: [id number] Arrival date & time: 11/30/18  1021     History   Chief Complaint Chief Complaint  Patient presents with  . Blood In Stools  . Hematemesis    HPI Douglas Wong is a 38 y.o. male.  He has a history of GERD.  He was here a couple of days ago with some lower chest pressure.  He said last evening he had an episode of bright red blood that was on the surface of his stool.  He has had that before.  He does not feel constipated.  Today he felt nauseous when he woke up and he said he vomited a couple times any thought he saw a little bit of coffee-ground in it.  It was mostly food.  He denies any current chest pain or belly pain no dizziness no fevers or chills.  The history is provided by the patient.  Rectal Bleeding  Quality:  Bright red Amount:  Scant Timing:  Intermittent Chronicity:  Recurrent Context: defecation   Context: not constipation, not foreign body, not hemorrhoids, not rectal injury and not rectal pain   Similar prior episodes: yes   Relieved by:  None tried Worsened by:  Nothing Ineffective treatments:  None tried Associated symptoms: hematemesis (??) and vomiting   Associated symptoms: no abdominal pain, no dizziness, no epistaxis, no fever and no loss of consciousness   Risk factors: no anticoagulant use and no NSAID use     Past Medical History:  Diagnosis Date  . Alcoholic fatty liver   . Deviated nasal septum   . GERD (gastroesophageal reflux disease)   . Hyperlipidemia   . Hypertension    CONTROLLED ON MEDS  . Migraines    EVERY OTHER DAY  . Nasal turbinate hypertrophy   . Neuromuscular disorder (HCC)    MUSCLE TWITCHES OCCASIONALLY  . Tinnitus    RIGHT EAR  . Vertigo    HX OF    Patient Active Problem List   Diagnosis Date Noted  . Tobacco abuse 08/25/2017  . Elevated CPK 10/22/2015  . Fasciculations 10/20/2015  . Dizziness and giddiness 09/03/2014  . Numbness  and tingling 09/03/2014  . Blurry vision 07/31/2014  . HTN (hypertension) 07/31/2014  . Scapular dyskinesis 07/11/2014    Past Surgical History:  Procedure Laterality Date  . COLONOSCOPY    . NO PAST SURGERIES    . SEPTOPLASTY Bilateral 09/02/2016   Procedure: SEPTOPLASTY;  Surgeon: Vernie MurdersPaul Juengel, MD;  [facility];  Service: ENT;  Laterality: Bilateral;  . TURBINATE REDUCTION Bilateral 09/02/2016   Procedure: TURBINATE REDUCTION;  Surgeon: Vernie MurdersPaul Juengel, MD;  [facility];  Service: ENT;  Laterality: Bilateral;        Home Medications    Prior to Admission medications   Medication Sig Start Date End Date Taking? Authorizing Provider  atorvastatin (LIPITOR) 10 MG tablet Take 10 mg by mouth daily. 06/07/16  Yes [provider]  cholecalciferol (VITAMIN D3) 25 MCG (1000 UT) tablet Take 1,000 Units by mouth 2 (two) times daily.   Yes [provider]  lisinopril (PRINIVIL,ZESTRIL) 20 MG tablet Take 20 mg by mouth daily. 11/04/18  Yes [provider]  naproxen (NAPROSYN) 500 MG tablet Take 1 tablet (500 mg total) by mouth 2 (two) times daily. Patient taking differently: Take 500 mg by mouth 2 (two) times daily as needed for mild pain.  11/28/18  Yes Dartha LodgeFord, Kelsey N, PA-C  Family History Family History  Problem Relation Age of Onset  . Diabetes Mother   . Hypertension Mother   . Cancer Father        liver  . Hypertension Father   . Cancer Paternal Uncle        coon cancer     Social History Social History   Tobacco Use  . Smoking status: Former Smoker    Packs/day: 0.30    Years: 15.00    Pack years: 4.50    Types: Cigarettes    Last attempt to quit: 10/04/2018    Years since quitting: 0.1  . Smokeless tobacco: Never Used  . Tobacco comment: trouble getting started  Substance Use Topics  . Alcohol use: Yes    Alcohol/week: 4.0 standard drinks    Types: 4 Cans of beer per week    Comment: 4 times a month     . Drug use: No     Allergies   Patient has no known allergies.   Review of Systems Review of Systems  Constitutional: Negative for fever.  HENT: Negative for nosebleeds and sore throat.   Eyes: Negative for visual disturbance.  Respiratory: Negative for shortness of breath.   Cardiovascular: Negative for chest pain.  Gastrointestinal: Positive for anal bleeding, blood in stool, hematemesis (??), hematochezia, nausea and vomiting. Negative for abdominal pain, diarrhea and rectal pain.  Genitourinary: Negative for dysuria.  Musculoskeletal: Negative for neck pain.  Skin: Negative for rash.  Neurological: Negative for dizziness and loss of consciousness.     Physical Exam Updated Vital Signs BP (!) 146/97 (BP Location: Left Arm)   Pulse 88   Temp 98.1 F (36.7 C) (Oral)   Resp 16   Ht 6' (1.829 m)   Wt 99.3 kg   SpO2 97%   BMI 29.70 kg/m   Physical Exam Vitals signs and nursing note reviewed.  Constitutional:      Appearance: He is well-developed.  HENT:     Head: Normocephalic and atraumatic.  Eyes:     Conjunctiva/sclera: Conjunctivae normal.  Neck:     Musculoskeletal: Neck supple.  Cardiovascular:     Rate and Rhythm: Normal rate and regular rhythm.     Heart sounds: No murmur.  Pulmonary:     Effort: Pulmonary effort is normal. No respiratory distress.     Breath sounds: Normal breath sounds.  Abdominal:     Palpations: Abdomen is soft.     Tenderness: There is no abdominal tenderness.  Musculoskeletal:        General: No tenderness or signs of injury.  Skin:    General: Skin is warm and dry.     Capillary Refill: Capillary refill takes less than 2 seconds.  Neurological:     General: No focal deficit present.     Mental Status: He is alert and oriented to person, place, and time.     Gait: Gait normal.      ED Treatments / Results  Labs (all labs ordered are listed, but only abnormal results are displayed) Labs Reviewed  COMPREHENSIVE  METABOLIC PANEL - Abnormal; Notable for the following components:      Result Value   Glucose, Bld 122 (*)    ALT 52 (*)    All other components within normal limits  CBC - Abnormal; Notable for the following components:   HCT 38.8 (*)    All other components within normal limits  POC OCCULT BLOOD, ED  TYPE AND SCREEN  ABO/RH  EKG None  Radiology No results found.  Procedures Procedures (including critical care time)  Medications Ordered in ED Medications - No data to display   Initial Impression / Assessment and Plan / ED Course  I have reviewed the triage vital signs and the nursing notes.  Pertinent labs & imaging results that were available during my care of the patient were reviewed by me and considered in my medical decision making (see chart for details).  Clinical Course as of Dec 01 1539  Thu Nov 30, 2018  1409 Patient is very well-appearing.  His lab work is unremarkable has a normal BUN.  The level of his bleeding seems very minor.  He said he has had the rectal bleeding before and has had a colonoscopy for it.  I think be reasonable to get him back on his PPI that he has been taking and refer him back to GI.   [MB]    Clinical Course User Index [MB] Terrilee Files, MD     Final Clinical Impressions(s) / ED Diagnoses   Final diagnoses:  Upper GI bleed  Rectal bleeding    ED Discharge Orders         Ordered    omeprazole (PRILOSEC) 20 MG capsule  Daily     11/30/18 1410           Terrilee Files, MD 11/30/18 1541

## 2018-11-30 NOTE — Discharge Instructions (Signed)
You were seen in the emergency department for an episode of rectal bleeding yesterday and possibly some upper GI bleeding in your vomit today.  Your exam and lab tests were unremarkable.  We are restarting you back on your acid medication.  Please follow-up with GI if your symptoms continue.  If they acutely worsen please return to the emergency department.

## 2018-11-30 NOTE — ED Notes (Signed)
Patient given discharge teaching and verbalized understanding. Patient ambulated out of ED with a steady gait. 

## 2018-12-21 ENCOUNTER — Emergency Department (HOSPITAL_COMMUNITY)
Admission: EM | Admit: 2018-12-21 | Discharge: 2018-12-21 | Disposition: A | Discharge status: Home / Self Care | Payer: BLUE CROSS/BLUE SHIELD | Attending: Emergency Medicine | Admitting: Emergency Medicine

## 2018-12-21 ENCOUNTER — Encounter (HOSPITAL_COMMUNITY): Payer: Self-pay

## 2018-12-21 DIAGNOSIS — R51 Headache: Secondary | ICD-10-CM | POA: Insufficient documentation

## 2018-12-21 DIAGNOSIS — I10 Essential (primary) hypertension: Secondary | ICD-10-CM | POA: Diagnosis not present

## 2018-12-21 DIAGNOSIS — Z87891 Personal history of nicotine dependence: Secondary | ICD-10-CM | POA: Insufficient documentation

## 2018-12-21 DIAGNOSIS — Z79899 Other long term (current) drug therapy: Secondary | ICD-10-CM | POA: Insufficient documentation

## 2018-12-21 DIAGNOSIS — R519 Headache, unspecified: Secondary | ICD-10-CM

## 2018-12-21 MED ORDER — METOCLOPRAMIDE HCL 5 MG/ML IJ SOLN
10.0000 mg | Freq: Once | INTRAMUSCULAR | Status: AC
  Administered 2018-12-21: 10 mg via INTRAVENOUS
  Filled 2018-12-21: qty 2

## 2018-12-21 MED ORDER — SODIUM CHLORIDE 0.9 % IV BOLUS
1000.0000 mL | Freq: Once | INTRAVENOUS | Status: AC
  Administered 2018-12-21: 1000 mL via INTRAVENOUS

## 2018-12-21 MED ORDER — DIPHENHYDRAMINE HCL 50 MG/ML IJ SOLN
12.5000 mg | Freq: Once | INTRAMUSCULAR | Status: AC
  Administered 2018-12-21: 12.5 mg via INTRAVENOUS
  Filled 2018-12-21: qty 1

## 2018-12-21 MED ORDER — KETOROLAC TROMETHAMINE 30 MG/ML IJ SOLN
30.0000 mg | Freq: Once | INTRAMUSCULAR | Status: AC
  Administered 2018-12-21: 30 mg via INTRAVENOUS
  Filled 2018-12-21: qty 1

## 2018-12-21 NOTE — ED Triage Notes (Signed)
Pt complains of a frontal headache for one week, he states that it's now radiating to the back of his head [facility]

## 2018-12-21 NOTE — Discharge Instructions (Signed)
Glad you are feeling better. °Follow-up with your primary care doctor. °Return here for any new/acute changes. °

## 2018-12-21 NOTE — ED Provider Notes (Signed)
[facility]-EMERGENCY DEPT Provider Note   CSN: [id number] Arrival date & time: 12/21/18  0236     History   Chief Complaint No chief complaint on file.   HPI Douglas Wong is a 38 y.o. male.  The history is provided by the patient and medical records.     38 y.o. M with hx of fatty liver, GERD, HTN, HLP, migraines, vertigo, presenting to the ED for headache.  States he has been having headache for about a week.  At first was localized to his forehead, dull and aching, relieved by motrin.  Headache now along the sides of his head, more pulsatile in nature.  States he has developed some light sensitivity.  No nausea or vomiting.  No dizziness, numbness, weakness, confusion, blurred vision.  He is not currently on anticoagulation.  No head trauma or falls.  Past Medical History:  Diagnosis Date  . Alcoholic fatty liver   . Deviated nasal septum   . GERD (gastroesophageal reflux disease)   . Hyperlipidemia   . Hypertension    CONTROLLED ON MEDS  . Migraines    EVERY OTHER DAY  . Nasal turbinate hypertrophy   . Neuromuscular disorder (HCC)    MUSCLE TWITCHES OCCASIONALLY  . Tinnitus    RIGHT EAR  . Vertigo    HX OF    Patient Active Problem List   Diagnosis Date Noted  . Tobacco abuse 08/25/2017  . Elevated CPK 10/22/2015  . Fasciculations 10/20/2015  . Dizziness and giddiness 09/03/2014  . Numbness and tingling 09/03/2014  . Blurry vision 07/31/2014  . HTN (hypertension) 07/31/2014  . Scapular dyskinesis 07/11/2014    Past Surgical History:  Procedure Laterality Date  . COLONOSCOPY    . NO PAST SURGERIES    . SEPTOPLASTY Bilateral 09/02/2016   Procedure: SEPTOPLASTY;  Surgeon: Vernie Murders, MD;  Location[facility];  Service: ENT;  Laterality: Bilateral;  . TURBINATE REDUCTION Bilateral 09/02/2016   Procedure: TURBINATE REDUCTION;  Surgeon: Vernie Murders, MD;  Location[facility];  Service: ENT;  Laterality: Bilateral;         Home Medications    Prior to Admission medications   Medication Sig Start Date End Date Taking? Authorizing Provider  atorvastatin (LIPITOR) 10 MG tablet Take 10 mg by mouth daily. 06/07/16  Yes [provider]  cholecalciferol (VITAMIN D3) 25 MCG (1000 UT) tablet Take 1,000 Units by mouth 2 (two) times daily.   Yes [provider]  lisinopril (PRINIVIL,ZESTRIL) 20 MG tablet Take 20 mg by mouth daily. 11/04/18  Yes [provider]  naproxen (NAPROSYN) 500 MG tablet Take 1 tablet (500 mg total) by mouth 2 (two) times daily. Patient taking differently: Take 500 mg by mouth 2 (two) times daily as needed for mild pain.  11/28/18  Yes Dartha Lodge, PA-C  omeprazole (PRILOSEC) 20 MG capsule Take 1 capsule (20 mg total) by mouth daily. 11/30/18  Yes Terrilee Files, MD    Family History Family History  Problem Relation Age of Onset  . Diabetes Mother   . Hypertension Mother   . Cancer Father        liver  . Hypertension Father   . Cancer Paternal Uncle        coon cancer     Social History Social History   Tobacco Use  . Smoking status: Former Smoker    Packs/day: 0.30    Years: 15.00    Pack years: 4.50  Types: Cigarettes    Last attempt to quit: 10/04/2018    Years since quitting: 0.2  . Smokeless tobacco: Never Used  . Tobacco comment: trouble getting started  Substance Use Topics  . Alcohol use: Yes    Alcohol/week: 4.0 standard drinks    Types: 4 Cans of beer per week    Comment: 4 times a month   . Drug use: No     Allergies   Patient has no known allergies.   Review of Systems Review of Systems  Eyes: Positive for photophobia.  Neurological: Positive for headaches.  All other systems reviewed and are negative.    Physical Exam Updated Vital Signs BP (!) 156/101 (BP Location: Left Arm)   Pulse 85   Temp 98.8 F (37.1 C) (Oral)   Resp 18   SpO2 97%   Physical Exam Vitals signs and nursing note reviewed.   Constitutional:      General: He is not in acute distress.    Appearance: He is well-developed. He is not diaphoretic.  HENT:     Head: Normocephalic and atraumatic.     Right Ear: External ear normal.     Left Ear: External ear normal.  Eyes:     Conjunctiva/sclera: Conjunctivae normal.     Pupils: Pupils are equal, round, and reactive to light.  Neck:     Musculoskeletal: Full passive range of motion without pain, normal range of motion and neck supple. No neck rigidity.     Comments: No rigidity, no meningismus Cardiovascular:     Rate and Rhythm: Normal rate and regular rhythm.     Heart sounds: Normal heart sounds. No murmur.  Pulmonary:     Effort: Pulmonary effort is normal. No respiratory distress.     Breath sounds: Normal breath sounds. No wheezing or rhonchi.  Chest:     Comments: Wearing event monitor Abdominal:     General: Bowel sounds are normal.     Palpations: Abdomen is soft.     Tenderness: There is no abdominal tenderness. There is no guarding.  Musculoskeletal: Normal range of motion.  Skin:    General: Skin is warm and dry.     Findings: No rash.  Neurological:     Mental Status: He is alert and oriented to person, place, and time.     Cranial Nerves: No cranial nerve deficit.     Sensory: No sensory deficit.     Motor: No tremor or seizure activity.     Comments: AAOx3, answering questions and following commands appropriately; equal strength UE and LE bilaterally; CN grossly intact; moves all extremities appropriately without ataxia; no focal neuro deficits or facial asymmetry appreciated  Psychiatric:        Behavior: Behavior normal.        Thought Content: Thought content normal.      ED Treatments / Results  Labs (all labs ordered are listed, but only abnormal results are displayed) Labs Reviewed - No data to display  EKG None  Radiology No results found.  Procedures Procedures (including critical care time)  Medications Ordered in  ED Medications  sodium chloride 0.9 % bolus 1,000 mL (1,000 mLs Intravenous New Bag/Given 12/21/18 0324)  ketorolac (TORADOL) 30 MG/ML injection 30 mg (30 mg Intravenous Given 12/21/18 0325)  metoCLOPramide (REGLAN) injection 10 mg (10 mg Intravenous Given 12/21/18 0325)  diphenhydrAMINE (BENADRYL) injection 12.5 mg (12.5 mg Intravenous Given 12/21/18 0329)     Initial Impression / Assessment and Plan / ED  Course  I have reviewed the triage vital signs and the nursing notes.  Pertinent labs & imaging results that were available during my care of the patient were reviewed by me and considered in my medical decision making (see chart for details).  38 year old male here with headache.  Ongoing for about a week now, initially improving with ibuprofen but now resistant.  No acute changes in pain, has changed location.  He is afebrile and nontoxic.  Awake, alert, appropriately oriented.  Neurologic exam is nonfocal.  He has no signs or symptoms suggestive of meningitis.  Plan for migraine cocktail will reassess.  4:58 AM Headache much improved after migraine cocktail here.  Patient VSS.  Neurologic exam remains non-focal.  Appears stable for discharge home.  Can follow-up with PCP.  Return here for any new/acute changes.  Final Clinical Impressions(s) / ED Diagnoses   Final diagnoses:  Bad headache    ED Discharge Orders    None       Garlon HatchetSanders, Emmarose Klinke M, PA-C 12/21/18 0503    Molpus, Jonny RuizJohn, MD 12/21/18 [phone]

## 2020-01-18 ENCOUNTER — Other Ambulatory Visit: Payer: Self-pay

## 2020-01-18 ENCOUNTER — Emergency Department (HOSPITAL_COMMUNITY): Payer: BLUE CROSS/BLUE SHIELD

## 2020-01-18 ENCOUNTER — Encounter (HOSPITAL_COMMUNITY): Payer: Self-pay

## 2020-01-18 ENCOUNTER — Emergency Department (HOSPITAL_COMMUNITY)
Admission: EM | Admit: 2020-01-18 | Discharge: 2020-01-18 | Disposition: A | Discharge status: Home / Self Care | Payer: BLUE CROSS/BLUE SHIELD | Attending: Emergency Medicine | Admitting: Emergency Medicine

## 2020-01-18 DIAGNOSIS — Z87891 Personal history of nicotine dependence: Secondary | ICD-10-CM | POA: Diagnosis not present

## 2020-01-18 DIAGNOSIS — I10 Essential (primary) hypertension: Secondary | ICD-10-CM | POA: Diagnosis not present

## 2020-01-18 DIAGNOSIS — R079 Chest pain, unspecified: Secondary | ICD-10-CM | POA: Insufficient documentation

## 2020-01-18 DIAGNOSIS — Z79899 Other long term (current) drug therapy: Secondary | ICD-10-CM | POA: Diagnosis not present

## 2020-01-18 LAB — CBC
HCT: 42 % (ref 39.0–52.0)
Hemoglobin: 14.5 g/dL (ref 13.0–17.0)
MCH: 30.5 pg (ref 26.0–34.0)
MCHC: 34.5 g/dL (ref 30.0–36.0)
MCV: 88.2 fL (ref 80.0–100.0)
Platelets: 202 10*3/uL (ref 150–400)
RBC: 4.76 MIL/uL (ref 4.22–5.81)
RDW: 12.4 % (ref 11.5–15.5)
WBC: 5.5 10*3/uL (ref 4.0–10.5)
nRBC: 0 % (ref 0.0–0.2)

## 2020-01-18 LAB — BASIC METABOLIC PANEL
Anion gap: 9 (ref 5–15)
BUN: 14 mg/dL (ref 6–20)
CO2: 28 mmol/L (ref 22–32)
Calcium: 9.4 mg/dL (ref 8.9–10.3)
Chloride: 103 mmol/L (ref 98–111)
Creatinine, Ser: 0.91 mg/dL (ref 0.61–1.24)
GFR calc Af Amer: >60 mL/min (ref >60)
GFR calc non Af Amer: >60 mL/min (ref >60)
Glucose, Bld: 100 mg/dL — ABNORMAL HIGH (ref 70–99)
Potassium: 3.8 mmol/L (ref 3.5–5.1)
Sodium: 140 mmol/L (ref 135–145)

## 2020-01-18 LAB — TROPONIN I (HIGH SENSITIVITY): Troponin I (High Sensitivity): 2 ng/L (ref <18)

## 2020-01-18 MED ORDER — SODIUM CHLORIDE 0.9% FLUSH: 3.0000 mL | Freq: Once | INTRAVENOUS | Status: DC

## 2020-01-18 NOTE — Discharge Instructions (Addendum)
You were seen in the emergency department for chest pain that is becoming more frequent.  You had blood work EKG and a chest x-ray that did not show an obvious cause of your symptoms.  We are placing a referral for you to follow-up with cardiology.  Please return to the emergency department if any acute worsening in your symptoms.

## 2020-01-18 NOTE — ED Triage Notes (Signed)
Patient states he has been having intermittent left chest pain x 2 weeks, but has been daily for the past 5 days. Patient states he also has been having palpitations.

## 2020-01-18 NOTE — ED Provider Notes (Signed)
[facility]-EMERGENCY DEPT Provider Note   CSN: [id number] Arrival date & time: 01/18/20  1332     History Chief Complaint  Patient presents with  . Chest Pain    Douglas Wong is a 39 y.o. male.  He has a history of hypertension hyperlipidemia.  He said he has had ongoing intermittent chest pain but for the last week or so it has been daily.  Lasts about 30 seconds at a time.  Not associate with any activity.  He says he short of breath sometimes.  He quit smoking, infrequent alcohol, denies drugs including cocaine.  He said he has had a Holter monitor and EKGs by his PCP but still is having the symptoms.  Does feel his heart pounding loudly when he is at rest.  The history is provided by the patient.  Chest Pain Pain location:  L chest Pain quality: aching   Pain radiates to:  L shoulder and L arm Pain severity:  Moderate Onset quality:  Gradual Timing:  Intermittent Progression:  Unchanged Chronicity:  Chronic Relieved by:  Nothing Worsened by:  Nothing Ineffective treatments:  None tried Associated symptoms: shortness of breath   Associated symptoms: no abdominal pain, no back pain, no cough, no diaphoresis, no dysphagia, no fever, no headache, no heartburn, no nausea and no vomiting   Risk factors: high cholesterol, hypertension and male sex     HPI: A 39 year old patient with a history of hypertension and hypercholesterolemia presents for evaluation of chest pain. Initial onset of pain was approximately 1-3 hours ago. The patient's chest pain is sharp and is not worse with exertion. The patient's chest pain is middle- or left-sided, is not well-localized, is not described as heaviness/pressure/tightness and does radiate to the arms/jaw/neck. The patient does not complain of nausea and denies diaphoresis. The patient has no history of stroke, has no history of peripheral artery disease, has not smoked in the past 90 days, denies any history of treated  diabetes, has no relevant family history of coronary artery disease (first degree relative at less than age 66) and does not have an elevated BMI (>=30).   Past Medical History:  Diagnosis Date  . Alcoholic fatty liver   . Deviated nasal septum   . GERD (gastroesophageal reflux disease)   . Hyperlipidemia   . Hypertension    CONTROLLED ON MEDS  . Migraines    EVERY OTHER DAY  . Nasal turbinate hypertrophy   . Neuromuscular disorder (HCC)    MUSCLE TWITCHES OCCASIONALLY  . Tinnitus    RIGHT EAR  . Vertigo    HX OF    Patient Active Problem List   Diagnosis Date Noted  . Tobacco abuse 08/25/2017  . Elevated CPK 10/22/2015  . Fasciculations 10/20/2015  . Dizziness and giddiness 09/03/2014  . Numbness and tingling 09/03/2014  . Blurry vision 07/31/2014  . HTN (hypertension) 07/31/2014  . Scapular dyskinesis 07/11/2014    Past Surgical History:  Procedure Laterality Date  . COLONOSCOPY    . NO PAST SURGERIES    . SEPTOPLASTY Bilateral 09/02/2016   Procedure: SEPTOPLASTY;  Surgeon: Vernie Murders, MD;  Location: [facility];  Service: ENT;  Laterality: Bilateral;  . TURBINATE REDUCTION Bilateral 09/02/2016   Procedure: TURBINATE REDUCTION;  Surgeon: Vernie Murders, MD;  Location: [facility];  Service: ENT;  Laterality: Bilateral;       Family History  Problem Relation Age of Onset  . Diabetes Mother   . Hypertension Mother   .  Cancer Father        liver  . Hypertension Father   . Cancer Paternal Uncle        coon cancer     Social History   Tobacco Use  . Smoking status: Former Smoker    Packs/day: 0.30    Years: 15.00    Pack years: 4.50    Types: Cigarettes    Quit date: 10/04/2018    Years since quitting: 1.2  . Smokeless tobacco: Never Used  . Tobacco comment: trouble getting started  Substance Use Topics  . Alcohol use: Yes    Alcohol/week: 4.0 standard drinks    Types: 4 Cans of beer per week    Comment: 4 times a month   . Drug  use: No    Home Medications Prior to Admission medications   Medication Sig Start Date End Date Taking? Authorizing Provider  atorvastatin (LIPITOR) 10 MG tablet Take 10 mg by mouth daily. 06/07/16   [provider]  cholecalciferol (VITAMIN D3) 25 MCG (1000 UT) tablet Take 1,000 Units by mouth 2 (two) times daily.    [provider]  lisinopril (PRINIVIL,ZESTRIL) 20 MG tablet Take 20 mg by mouth daily. 11/04/18   [provider]  naproxen (NAPROSYN) 500 MG tablet Take 1 tablet (500 mg total) by mouth 2 (two) times daily. Patient taking differently: Take 500 mg by mouth 2 (two) times daily as needed for mild pain.  11/28/18   Jacqlyn Larsen, PA-C  omeprazole (PRILOSEC) 20 MG capsule Take 1 capsule (20 mg total) by mouth daily. 11/30/18   [name], MD    Allergies    Patient has no known allergies.  Review of Systems   Review of Systems  Constitutional: Negative for diaphoresis and fever.  HENT: Negative for sore throat and trouble swallowing.   Eyes: Negative for visual disturbance.  Respiratory: Positive for shortness of breath. Negative for cough.   Cardiovascular: Positive for chest pain.  Gastrointestinal: Negative for abdominal pain, heartburn, nausea and vomiting.  Genitourinary: Negative for dysuria.  Musculoskeletal: Negative for back pain.  Skin: Negative for rash.  Neurological: Negative for headaches.    Physical Exam Updated Vital Signs BP 128/80 (BP Location: Right Arm)   Pulse 80   Temp 98.3 F (36.8 C) (Oral)   Resp 17   Ht 6' (1.829 m)   Wt 96.6 kg   SpO2 99%   BMI 28.89 kg/m   Physical Exam Vitals and nursing note reviewed.  Constitutional:      Appearance: He is well-developed.  HENT:     Head: Normocephalic and atraumatic.  Eyes:     Conjunctiva/sclera: Conjunctivae normal.  Cardiovascular:     Rate and Rhythm: Normal rate and regular rhythm.     Heart sounds: Normal heart sounds. No murmur.  Pulmonary:      Effort: Pulmonary effort is normal. No respiratory distress.     Breath sounds: Normal breath sounds.  Abdominal:     Palpations: Abdomen is soft.     Tenderness: There is no abdominal tenderness.  Musculoskeletal:        General: Normal range of motion.     Cervical back: Neck supple.     Right lower leg: No tenderness. No edema.     Left lower leg: No tenderness. No edema.  Skin:    General: Skin is warm and dry.     Capillary Refill: Capillary refill takes less than 2 seconds.  Neurological:  General: No focal deficit present.     Mental Status: He is alert.     ED Results / Procedures / Treatments   Labs (all labs ordered are listed, but only abnormal results are displayed) Labs Reviewed  BASIC METABOLIC PANEL - Abnormal; Notable for the following components:      Result Value   Glucose, Bld 100 (*)    All other components within normal limits  CBC  TROPONIN I (HIGH SENSITIVITY)  TROPONIN I (HIGH SENSITIVITY)    EKG EKG Interpretation  Date/Time:  Friday January 18 2020 13:38:46 EST Ventricular Rate:  77 PR Interval:    QRS Duration: 94 QT Interval:  372 QTC Calculation: 421 R Axis:   21 Text Interpretation: Sinus rhythm RSR' in V1 or V2, right VCD or RVH Nonspecific T abnormalities, lateral leads increased t wave changes from prior 1/20 Confirmed by Meridee Score [phone]) on 01/18/2020 1:41:58 PM Also confirmed by Meridee Score [phone]), editor Elita Quick [phone])  on 01/18/2020 2:05:01 PM   Radiology DG Chest 2 View  Result Date: 01/18/2020 CLINICAL DATA:  Chest pain EXAM: CHEST - 2 VIEW COMPARISON:  11/28/2018 FINDINGS: The heart size and mediastinal contours are within normal limits. Both lungs are clear. The visualized skeletal structures are unremarkable. IMPRESSION: No acute abnormality of the lungs. Electronically Signed   By: Lauralyn Primes M.D.   On: 01/18/2020 14:32    Procedures Procedures (including critical care time)  Medications Ordered in  ED Medications  sodium chloride flush (NS) 0.9 % injection 3 mL (has no administration in time range)    ED Course  I have reviewed the triage vital signs and the nursing notes.  Pertinent labs & imaging results that were available during my care of the patient were reviewed by me and considered in my medical decision making (see chart for details).  Clinical Course as of Jan 17 1799  Fri Jan 18, 2020  1407 Differential includes arrhythmia, ACS, pneumonia, GERD, musculoskeletal, vascular   [MB]  1407 Patient is PERC negative.   [MB]  1435 Chest x-ray interpreted by me as clear lungs no pneumothorax normal mediastinum.   [MB]  1549 Reviewed the patient's lab work results with him and have placed a referral in for outpatient follow-up with cardiology.  He is comfortable with plan and understands he can return if any worsening symptoms.   [MB]    Clinical Course User Index [MB] Terrilee Files, MD   MDM Rules/Calculators/A&P HEAR Score: 2                     Final Clinical Impression(s) / ED Diagnoses Final diagnoses:  Nonspecific chest pain    Rx / DC Orders ED Discharge Orders         Ordered    Ambulatory referral to Cardiology     01/18/20 1544           Terrilee Files, MD 01/18/20 1800

## 2020-01-23 ENCOUNTER — Ambulatory Visit: Payer: BLUE CROSS/BLUE SHIELD | Admitting: Cardiology

## 2020-01-23 ENCOUNTER — Other Ambulatory Visit: Payer: Self-pay

## 2023-12-13 ENCOUNTER — Encounter (HOSPITAL_BASED_OUTPATIENT_CLINIC_OR_DEPARTMENT_OTHER): Payer: Self-pay

## 2023-12-13 ENCOUNTER — Other Ambulatory Visit: Payer: Self-pay

## 2023-12-13 ENCOUNTER — Emergency Department (HOSPITAL_BASED_OUTPATIENT_CLINIC_OR_DEPARTMENT_OTHER): Payer: BLUE CROSS/BLUE SHIELD

## 2023-12-13 ENCOUNTER — Emergency Department (HOSPITAL_BASED_OUTPATIENT_CLINIC_OR_DEPARTMENT_OTHER)
Admission: EM | Admit: 2023-12-13 | Discharge: 2023-12-13 | Disposition: A | Discharge status: Home / Self Care | Payer: BLUE CROSS/BLUE SHIELD | Attending: Emergency Medicine | Admitting: Emergency Medicine

## 2023-12-13 DIAGNOSIS — Z20822 Contact with and (suspected) exposure to covid-19: Secondary | ICD-10-CM | POA: Insufficient documentation

## 2023-12-13 DIAGNOSIS — J069 Acute upper respiratory infection, unspecified: Secondary | ICD-10-CM | POA: Diagnosis not present

## 2023-12-13 DIAGNOSIS — R079 Chest pain, unspecified: Secondary | ICD-10-CM

## 2023-12-13 DIAGNOSIS — I10 Essential (primary) hypertension: Secondary | ICD-10-CM | POA: Diagnosis not present

## 2023-12-13 LAB — RESP PANEL BY RT-PCR (RSV, FLU A&B, COVID)  RVPGX2
Influenza A by PCR: NEGATIVE
Influenza B by PCR: NEGATIVE
Resp Syncytial Virus by PCR: NEGATIVE
SARS Coronavirus 2 by RT PCR: NEGATIVE

## 2023-12-13 NOTE — ED Triage Notes (Signed)
In for eval of intermittent chest pain and worse with deep inspiration on Thursday. Nonproductive cough and headache. Onset Friday. Diag ear infection and started antibiotic. Chills. Sweating.

## 2023-12-13 NOTE — ED Provider Notes (Signed)
[place] EMERGENCY DEPARTMENT AT [facility] Provider Note   CSN: [id number] Arrival date & time: 12/13/23  1505     History Chief Complaint  Patient presents with   Chest Pain    HPI Douglas Wong is a 43 y.o. male presenting for fever cough congestion as well as some chest pain. Diagnosed with OM and started on ABX. Cefdinir.  Hx of HTN/FLD Quit smoking a month ago.  Patient's recorded medical, surgical, social, medication list and allergies were reviewed in the Snapshot window as part of the initial history.   Review of Systems   Review of Systems  Constitutional:  Negative for chills and fever.  HENT:  Negative for ear pain and sore throat.   Eyes:  Negative for pain and visual disturbance.  Respiratory:  Positive for shortness of breath. Negative for cough.   Cardiovascular:  Positive for chest pain. Negative for palpitations.  Gastrointestinal:  Negative for abdominal pain and vomiting.  Genitourinary:  Negative for dysuria and hematuria.  Musculoskeletal:  Negative for arthralgias and back pain.  Skin:  Negative for color change and rash.  Neurological:  Negative for seizures and syncope.  All other systems reviewed and are negative.   Physical Exam Updated Vital Signs BP (!) 142/110 (BP Location: Right Arm)   Pulse 82   Temp 98.6 F (37 C)   Resp 17   Ht 5\' 11"  (1.803 m)   Wt 110.7 kg   SpO2 99%   BMI 34.03 kg/m  Physical Exam Vitals and nursing note reviewed.  Constitutional:      General: He is not in acute distress.    Appearance: He is well-developed.  HENT:     Head: Normocephalic and atraumatic.  Eyes:     Conjunctiva/sclera: Conjunctivae normal.  Cardiovascular:     Rate and Rhythm: Normal rate and regular rhythm.     Heart sounds: No murmur heard. Pulmonary:     Effort: Pulmonary effort is normal. No respiratory distress.     Breath sounds: Normal breath sounds.  Abdominal:     Palpations: Abdomen is soft.     Tenderness:  There is no abdominal tenderness.  Musculoskeletal:        General: No swelling.     Cervical back: Neck supple.  Skin:    General: Skin is warm and dry.     Capillary Refill: Capillary refill takes less than 2 seconds.  Neurological:     Mental Status: He is alert.  Psychiatric:        Mood and Affect: Mood normal.      ED Course/ Medical Decision Making/ A&P    Procedures Procedures   Medications Ordered in ED Medications - No data to display Medical Decision Making: Douglas Wong is a 43 y.o. male who presented to the ED today with chest pain, detailed above.  Based on patient's comorbidities, patient has a heart score of 2.    Patient placed on continuous vitals and telemetry monitoring while in ED which was reviewed periodically.  Complete initial physical exam performed, notably the patient was HDS in NAD.   Reviewed and confirmed nursing documentation for past medical history, family history, social history.    Initial Assessment: With the patient's presentation of left-sided chest pain, most likely diagnosis is musculoskeletal chest pain versus GERD, although ACS remains on the differential. Other diagnoses were considered including (but not limited to) pulmonary embolism, community-acquired pneumonia, aortic dissection, pneumothorax, underlying bony abnormality, anemia. These are considered  less likely due to history of present illness and physical exam findings.    In particular, concerning pulmonary embolism: Patient is PERC NEGATIVE and the they deny malignancy, recent surgery, history of DVT, or calf tenderness leading to a LOW risk Wells score. Aortic Dissection also reconsidered but seems less likely based on the location, quality, onset, and severity of symptoms in this case. Patient has a lack of serious comorbidities for this condition including a lack of HTN or Smoking. Patient also has a lack of underlying history of AD or TAA.  This is most consistent with an  acute life/limb threatening illness complicated by underlying chronic conditions.   Initial Plan: Evaluate for ACS with single troponin and EKG evaluated as below  Evaluate for dissection, bony abnormality, or pneumonia with chest x-ray and screening laboratory evaluation including CBC, BMP  Further evaluation for pulmonary embolism not indicated at this time based on patient's PERC and Wells score.  Further evaluation for Thoracic Aortic Dissection not indicated at this time based on patient's clinical history and PE findings.   Initial Study Results: EKG was reviewed independently. Rate, rhythm, axis, intervals all examined and without medically relevant abnormality. ST segments without concerns for elevations.    Laboratory  Single troponin demonstrated NAA   CBC and BMP without obvious metabolic or inflammatory abnormalities requiring further evaluation   Radiology  No results found.  Final Assessment and Plan: I reviewed all of patient's labs from yesterday.  They were done 24 hours ago and his pain has not changed since.  He states that he had an outpatient x-ray done as well that is still pending a report.  His symptoms have not changed. States that he just did not get a call back on his lab results and is requesting my review of them.  His troponin was negative.  His EKG on my review is negative for any acute pathology.  His lab work has inflammatory marker elevations.  He is already on Omnicef which would be the product coverage I would consider for possible pneumonia though this is likely a viral infection. Given negative workup in the outpatient setting lack of any acute symptoms today, I do not believe that repeating the workup would be beneficial.  I reviewed the workup as above and patient is stable for outpatient care management given the very benign nature of his exam negative troponin and low heart score.  Disposition:  I have considered need for hospitalization, however,  considering all of the above, I believe this patient is stable for discharge at this time.  Patient/family educated about specific return precautions for given chief complaint and symptoms.  Patient/family educated about follow-up with PCP.     Patient/family expressed understanding of return precautions and need for follow-up. Patient spoken to regarding all imaging and laboratory results and appropriate follow up for these results. All education provided in verbal form with additional information in written form. Time was allowed for answering of patient questions. Patient discharged.    Emergency Department Medication Summary:   Medications - No data to display                Clinical Impression:  1. Chest pain, unspecified type   2. Upper respiratory tract infection, unspecified type      Discharge   Final Clinical Impression(s) / ED Diagnoses Final diagnoses:  Chest pain, unspecified type  Upper respiratory tract infection, unspecified type    Rx / DC Orders ED Discharge Orders  None         Glyn Ade, MD 12/13/23 [phone]
